# Patient Record
Sex: Female | Born: 1975
Health system: Southern US, Community
[De-identification: ages and names within clinical notes are randomized; demographics above are authoritative.]

## PROBLEM LIST (undated history)

## (undated) DIAGNOSIS — R7303 Prediabetes: Secondary | ICD-10-CM

## (undated) DIAGNOSIS — IMO0001 Reserved for inherently not codable concepts without codable children: Secondary | ICD-10-CM

## (undated) DIAGNOSIS — Z872 Personal history of diseases of the skin and subcutaneous tissue: Secondary | ICD-10-CM

## (undated) DIAGNOSIS — I10 Essential (primary) hypertension: Secondary | ICD-10-CM

## (undated) HISTORY — DX: Essential (primary) hypertension: I10

## (undated) HISTORY — PX: PILONIDAL CYST EXCISION: SHX744

## (undated) HISTORY — DX: Personal history of diseases of the skin and subcutaneous tissue: Z87.2

## (undated) HISTORY — DX: Prediabetes: R73.03

## (undated) HISTORY — DX: Reserved for inherently not codable concepts without codable children: IMO0001

## (undated) HISTORY — PX: WISDOM TOOTH EXTRACTION: SHX21

---

## 2007-03-14 DIAGNOSIS — IMO0001 Reserved for inherently not codable concepts without codable children: Secondary | ICD-10-CM

## 2007-03-14 HISTORY — DX: Reserved for inherently not codable concepts without codable children: IMO0001

## 2009-08-13 ENCOUNTER — Ambulatory Visit: Payer: Self-pay | Admitting: Anesthesiology

## 2011-03-23 ENCOUNTER — Ambulatory Visit: Payer: Self-pay | Admitting: Internal Medicine

## 2011-04-13 ENCOUNTER — Ambulatory Visit: Payer: Self-pay | Admitting: Internal Medicine

## 2011-04-17 ENCOUNTER — Encounter: Payer: Self-pay | Admitting: Internal Medicine

## 2011-04-17 ENCOUNTER — Ambulatory Visit (INDEPENDENT_AMBULATORY_CARE_PROVIDER_SITE_OTHER): Payer: BC Managed Care – PPO | Admitting: Internal Medicine

## 2011-04-17 DIAGNOSIS — E119 Type 2 diabetes mellitus without complications: Secondary | ICD-10-CM | POA: Insufficient documentation

## 2011-04-17 DIAGNOSIS — I152 Hypertension secondary to endocrine disorders: Secondary | ICD-10-CM | POA: Insufficient documentation

## 2011-04-17 DIAGNOSIS — L02219 Cutaneous abscess of trunk, unspecified: Secondary | ICD-10-CM

## 2011-04-17 DIAGNOSIS — I10 Essential (primary) hypertension: Secondary | ICD-10-CM

## 2011-04-17 DIAGNOSIS — E1169 Type 2 diabetes mellitus with other specified complication: Secondary | ICD-10-CM | POA: Insufficient documentation

## 2011-04-17 DIAGNOSIS — L03319 Cellulitis of trunk, unspecified: Secondary | ICD-10-CM

## 2011-04-17 DIAGNOSIS — E669 Obesity, unspecified: Secondary | ICD-10-CM | POA: Insufficient documentation

## 2011-04-17 DIAGNOSIS — E1165 Type 2 diabetes mellitus with hyperglycemia: Secondary | ICD-10-CM | POA: Insufficient documentation

## 2011-04-17 LAB — LIPID PANEL
Cholesterol: 126 mg/dL (ref 0–200)
HDL: 50.3 mg/dL (ref >39.00)
LDL Cholesterol: 66 mg/dL (ref 0–99)
Triglycerides: 47 mg/dL (ref 0.0–149.0)
VLDL: 9.4 mg/dL (ref 0.0–40.0)

## 2011-04-17 LAB — COMPREHENSIVE METABOLIC PANEL
Alkaline Phosphatase: 83 U/L (ref 39–117)
BUN: 17 mg/dL (ref 6–23)
CO2: 26 mEq/L (ref 19–32)
Creatinine, Ser: 0.7 mg/dL (ref 0.4–1.2)
GFR: 114.92 mL/min (ref >60.00)
Glucose, Bld: 126 mg/dL — ABNORMAL HIGH (ref 70–99)
Sodium: 138 mEq/L (ref 135–145)
Total Bilirubin: 0.1 mg/dL — ABNORMAL LOW (ref 0.3–1.2)

## 2011-04-17 MED ORDER — PHENTERMINE HCL 37.5 MG PO CAPS: 37.5000 mg | ORAL_CAPSULE | ORAL | Status: DC

## 2011-04-17 MED ORDER — SULFAMETHOXAZOLE-TRIMETHOPRIM 800-160 MG PO TABS: 1.0000 | ORAL_TABLET | Freq: Two times a day (BID) | ORAL | Status: AC

## 2011-04-17 MED ORDER — METFORMIN HCL ER 500 MG PO TB24: 1000.0000 mg | ORAL_TABLET | Freq: Two times a day (BID) | ORAL | Status: DC

## 2011-04-17 NOTE — Assessment & Plan Note (Signed)
Patient with cellulitis on her left upper back. No fluctuant area visualized. Will treat with Bactrim. Patient will call if she develops any worsening swelling, pain, fever, or chills. Otherwise, followup in one week.

## 2011-04-17 NOTE — Assessment & Plan Note (Signed)
Blood pressure well-controlled today. We'll continue lisinopril hydrochlorothiazide. We'll check renal function with labs today. Followup in one week.

## 2011-04-17 NOTE — Assessment & Plan Note (Signed)
Patient is concerned about weight. BMI is 34. We discussed keeping a food diary and setting goal of calories less than 1500 per day. We also discussed setting a goal of moderate exercise such as walking 30 minutes 3 days a week. We will also start phentermine to help with appetite suppression. We discussed risk of phentermine including elevated blood pressure and increased heart rate. Patient will followup in one to 2 weeks for evaluation. Goal will be for one to 2 pounds of weight loss per week.

## 2011-04-17 NOTE — Progress Notes (Signed)
Subjective:    Patient ID: Veronica Norton, female    DOB: 1975-07-01, 36 y.o.   MRN: [id number]  HPI 36 year old female with history of diabetes, hypertension, obesity presents to establish care. In regards to her diabetes, she notes that she has not been monitoring her blood sugar. She has been taking her metformin. She has not had her A1c checked in several months. She denies any polyuria or polyphasia. She denies weight loss.  In regards to her hypertension, she denies any headache, chest pain, palpitations. She reports compliance with her lisinopril.  In regards to her obesity, she notes that she was evaluated for gastric bypass in the past but denied. She notes some recent dietary indiscretion. She is highly motivated and wants to get started a weight loss program. She does not currently follow any specific diet. She is currently sedentary.  She is also concerned about a several day history of red raised area on her left upper back. She denies any fever or chills. She denies any known injury at the site.  Outpatient Encounter Prescriptions as of 04/17/2011  Medication Sig Dispense Refill  . lisinopril-hydrochlorothiazide (PRINZIDE,ZESTORETIC) 10-12.5 MG per tablet Take 1 tablet by mouth daily.      . metFORMIN (GLUCOPHAGE-XR) 500 MG 24 hr tablet Take 1,000 mg by mouth 2 (two) times daily.      . norethindrone (NOR-QD) 0.35 MG tablet Take 1 tablet by mouth daily.      . phentermine 37.5 MG capsule Take 1 capsule (37.5 mg total) by mouth every morning.  30 capsule  2  . sulfamethoxazole-trimethoprim (BACTRIM DS,SEPTRA DS) 800-160 MG per tablet Take 1 tablet by mouth 2 (two) times daily.  20 tablet  0    Review of Systems  Constitutional: Negative for fever, chills, appetite change, fatigue and unexpected weight change.  HENT: Negative for ear pain, congestion, sore throat, trouble swallowing, neck pain, voice change and sinus pressure.   Eyes: Negative for visual disturbance.  Respiratory:  Negative for cough, shortness of breath, wheezing and stridor.   Cardiovascular: Negative for chest pain, palpitations and leg swelling.  Gastrointestinal: Negative for nausea, vomiting, abdominal pain, diarrhea, constipation, blood in stool, abdominal distention and anal bleeding.  Genitourinary: Negative for dysuria and flank pain.  Musculoskeletal: Negative for myalgias, arthralgias and gait problem.  Skin: Positive for color change and rash.  Neurological: Negative for dizziness and headaches.  Hematological: Negative for adenopathy. Does not bruise/bleed easily.  Psychiatric/Behavioral: Negative for suicidal ideas, sleep disturbance and dysphoric mood. The patient is not nervous/anxious.    BP 112/60  Pulse 97  Temp(Src) 98 F (36.7 C) (Oral)  Ht 5\' 5"  (1.651 m)  Wt 208 lb (94.348 kg)  BMI 34.61 kg/m2  SpO2 100%  LMP 03/17/2011     Objective:   Physical Exam  Constitutional: She is oriented to person, place, and time. She appears well-developed and well-nourished. No distress.  HENT:  Head: Normocephalic and atraumatic.  Right Ear: External ear normal.  Left Ear: External ear normal.  Nose: Nose normal.  Mouth/Throat: Oropharynx is clear and moist. No oropharyngeal exudate.  Eyes: Conjunctivae are normal. Pupils are equal, round, and reactive to light. Right eye exhibits no discharge. Left eye exhibits no discharge. No scleral icterus.  Neck: Normal range of motion. Neck supple. No tracheal deviation present. No thyromegaly present.  Cardiovascular: Normal rate, regular rhythm, normal heart sounds and intact distal pulses.  Exam reveals no gallop and no friction rub.   No murmur heard. Pulmonary/Chest:  Effort normal and breath sounds normal. No respiratory distress. She has no wheezes. She has no rales. She exhibits no tenderness.  Abdominal: Soft. Bowel sounds are normal. She exhibits no distension and no mass. There is no tenderness. There is no rebound and no guarding.    Musculoskeletal: Normal range of motion. She exhibits no edema and no tenderness.  Lymphadenopathy:    She has no cervical adenopathy.  Neurological: She is alert and oriented to person, place, and time. No cranial nerve deficit. She exhibits normal muscle tone. Coordination normal.  Skin: Skin is warm and dry. No rash noted. She is not diaphoretic. No erythema. No pallor.  Psychiatric: She has a normal mood and affect. Her behavior is normal. Judgment and thought content normal.          Assessment & Plan:

## 2011-04-17 NOTE — Patient Instructions (Signed)
Goal - write down everything you eat.  Goal - less than 1500 calories per day.  Goal - walk , 3 x per week.

## 2011-04-17 NOTE — Assessment & Plan Note (Signed)
Patient has not been regularly checking blood sugars. We'll plan to check hemoglobin A1c with labs today. We'll continue metformin. Followup in 2 weeks.

## 2011-04-24 ENCOUNTER — Ambulatory Visit (INDEPENDENT_AMBULATORY_CARE_PROVIDER_SITE_OTHER): Payer: BC Managed Care – PPO | Admitting: Internal Medicine

## 2011-04-24 ENCOUNTER — Encounter: Payer: Self-pay | Admitting: Internal Medicine

## 2011-04-24 VITALS — BP 118/62 | HR 91 | Temp 97.7°F | Ht 68.0 in | Wt 278.0 lb

## 2011-04-24 DIAGNOSIS — E1169 Type 2 diabetes mellitus with other specified complication: Secondary | ICD-10-CM

## 2011-04-24 DIAGNOSIS — N912 Amenorrhea, unspecified: Secondary | ICD-10-CM

## 2011-04-24 DIAGNOSIS — E119 Type 2 diabetes mellitus without complications: Secondary | ICD-10-CM

## 2011-04-24 DIAGNOSIS — I10 Essential (primary) hypertension: Secondary | ICD-10-CM

## 2011-04-24 DIAGNOSIS — L02219 Cutaneous abscess of trunk, unspecified: Secondary | ICD-10-CM

## 2011-04-24 DIAGNOSIS — L03319 Cellulitis of trunk, unspecified: Secondary | ICD-10-CM

## 2011-04-24 DIAGNOSIS — E669 Obesity, unspecified: Secondary | ICD-10-CM

## 2011-04-24 NOTE — Assessment & Plan Note (Signed)
Patient started phentermine. No side effects noted. Patient has lost 2 pounds since her last visit 2 weeks ago. Encouraged her to write down what she is eating. Will continue phentermine. Goal weight loss 1-2 pounds per week. Followup in one month.

## 2011-04-24 NOTE — Assessment & Plan Note (Signed)
Hemoglobin A1c 6.6% on recent labs. We'll continue to monitor blood sugars. We'll continue metformin. Encouraged efforts at weight loss. Followup in one month.

## 2011-04-24 NOTE — Assessment & Plan Note (Signed)
Area has completely resolved with the use of antibiotics. Patient will continue to monitor for any recurrence.

## 2011-04-24 NOTE — Assessment & Plan Note (Signed)
Likely secondary to concomitant use of birth control pill and antibiotics. Urine pregnancy test today was negative. Patient will call if no menstrual cycle by next week.

## 2011-04-24 NOTE — Assessment & Plan Note (Signed)
Blood pressures well-controlled today despite the use of phentermine. Recent renal function is normal. Patient will followup in one month.

## 2011-04-24 NOTE — Progress Notes (Signed)
Subjective:    Patient ID: Veronica Norton, female    DOB: [birth year], 36 y.o.   MRN: [id number]  HPI 36 year old female with history of diabetes, hypertension, and obesity presents for followup. In regards to her diabetes, recent hemoglobin A1c was 6.6%. She notes full compliance with her metformin. She did not bring a record of her blood sugars today.  In regards to her hypertension, patient notes full compliance with her medication. She denies any chest pain, palpitations, or headache.  In regards to her obesity, she recently started using phentermine to help with appetite suppression. She has not been recording her food diary. She does, however note a decrease in appetite over the last 2 weeks. She has lost 2 pounds. She denies any side effects noted from medication such as palpitation, chest pain, headache. She has not yet started an exercise program.  She is concerned today about amenorrhea. She notes she was scheduled for her. Last week but has not yet started. She has been using antibiotics to help with skin infection on her back in questions whether this may have interfered with her birth control pill.  In regards to the skin infection on her back, she reports near resolution of swelling and redness. She denies any fever or chills. She denies any noted side effects from antibiotics. She reports full compliance with her antibiotics.  Outpatient Encounter Prescriptions as of 04/24/2011  Medication Sig Dispense Refill  . lisinopril-hydrochlorothiazide (PRINZIDE,ZESTORETIC) 10-12.5 MG per tablet Take 1 tablet by mouth daily.      . metFORMIN (GLUCOPHAGE-XR) 500 MG 24 hr tablet Take 2 tablets (1,000 mg total) by mouth 2 (two) times daily.  360 tablet  1  . norethindrone (NOR-QD) 0.35 MG tablet Take 1 tablet by mouth daily.      . phentermine 37.5 MG capsule Take 1 capsule (37.5 mg total) by mouth every morning.  30 capsule  2  . sulfamethoxazole-trimethoprim (BACTRIM DS,SEPTRA DS) 800-160 MG per  tablet Take 1 tablet by mouth 2 (two) times daily.  20 tablet  0    Review of Systems  Constitutional: Negative for fever, chills, appetite change, fatigue and unexpected weight change.  HENT: Negative for ear pain, congestion, sore throat, trouble swallowing, neck pain, voice change and sinus pressure.   Eyes: Negative for visual disturbance.  Respiratory: Negative for cough, shortness of breath and wheezing.   Cardiovascular: Negative for chest pain, palpitations and leg swelling.  Gastrointestinal: Negative for nausea, vomiting, abdominal pain, diarrhea, constipation, blood in stool, abdominal distention and anal bleeding.  Genitourinary: Positive for menstrual problem. Negative for dysuria and flank pain.  Musculoskeletal: Negative for myalgias, arthralgias and gait problem.  Skin: Negative for color change and rash.  Neurological: Negative for dizziness and headaches.  Hematological: Negative for adenopathy. Does not bruise/bleed easily.  Psychiatric/Behavioral: Negative for suicidal ideas, sleep disturbance and dysphoric mood. The patient is not nervous/anxious.    BP 118/62  Pulse 91  Temp(Src) 97.7 F (36.5 C) (Oral)  Ht 5\' 8"  (1.727 m)  Wt 278 lb (126.1 kg)  BMI 42.27 kg/m2  SpO2 98%  LMP 03/17/2011     Objective:   Physical Exam  Constitutional: She is oriented to person, place, and time. She appears well-developed and well-nourished. No distress.  HENT:  Head: Normocephalic and atraumatic.  Right Ear: External ear normal.  Left Ear: External ear normal.  Nose: Nose normal.  Mouth/Throat: Oropharynx is clear and moist. No oropharyngeal exudate.  Eyes: Conjunctivae are normal. Pupils are equal, round,  and reactive to light. Right eye exhibits no discharge. Left eye exhibits no discharge. No scleral icterus.  Neck: Normal range of motion. Neck supple. No tracheal deviation present. No thyromegaly present.  Cardiovascular: Normal rate, regular rhythm, normal heart sounds  and intact distal pulses.  Exam reveals no gallop and no friction rub.   No murmur heard. Pulmonary/Chest: Effort normal and breath sounds normal. No respiratory distress. She has no wheezes. She has no rales. She exhibits no tenderness.  Musculoskeletal: Normal range of motion. She exhibits no edema and no tenderness.  Lymphadenopathy:    She has no cervical adenopathy.  Neurological: She is alert and oriented to person, place, and time. No cranial nerve deficit. She exhibits normal muscle tone. Coordination normal.  Skin: Skin is warm and dry. No rash noted. She is not diaphoretic. No erythema. No pallor.  Psychiatric: She has a normal mood and affect. Her behavior is normal. Judgment and thought content normal.          Assessment & Plan:

## 2011-05-23 ENCOUNTER — Ambulatory Visit (INDEPENDENT_AMBULATORY_CARE_PROVIDER_SITE_OTHER): Payer: BC Managed Care – PPO | Admitting: Internal Medicine

## 2011-05-23 ENCOUNTER — Encounter: Payer: Self-pay | Admitting: Internal Medicine

## 2011-05-23 DIAGNOSIS — E119 Type 2 diabetes mellitus without complications: Secondary | ICD-10-CM

## 2011-05-23 DIAGNOSIS — E1169 Type 2 diabetes mellitus with other specified complication: Secondary | ICD-10-CM

## 2011-05-23 DIAGNOSIS — I10 Essential (primary) hypertension: Secondary | ICD-10-CM

## 2011-05-23 DIAGNOSIS — E669 Obesity, unspecified: Secondary | ICD-10-CM

## 2011-05-23 NOTE — Assessment & Plan Note (Signed)
Blood pressure well-controlled today. We'll continue lisinopril hydrochlorothiazide. Followup 3 months.

## 2011-05-23 NOTE — Assessment & Plan Note (Signed)
Pt stopped phentermine because having some side effects including coolness in hands. She has been able to eliminate fried foods from her diet. Encouraged her to try limiting intake of sweets. Encouraged her to increase physical activity. She will followup in 3 months.

## 2011-05-23 NOTE — Assessment & Plan Note (Signed)
Recent A1c was well controlled at 6.6%. We'll continue metformin. Encouraged improved dietary habits and weight loss. Followup 3 months.

## 2011-05-23 NOTE — Progress Notes (Signed)
Subjective:    Patient ID: Veronica Norton, female    DOB: 19-Sep-1975, 36 y.o.   MRN: [id number]  HPI 36 year old female with history of obesity, diabetes, and hypertension presents for followup. She reports that she has been doing well. She notes that she stopped taking phentermine because she was having a side effect of coldness in her hands. This resolved with stopping the medication. She notes that she is working on her diet and has eliminated intake of fried foods. She is trying to reduce intake of processed sugar, but finds this difficult. In regards to her diabetes, she has not been checking her blood sugars. In regards to her hypertension, she denies any recent chest pain, palpitations, headache. She reports full compliance with her medicines.  Outpatient Encounter Prescriptions as of 05/23/2011  Medication Sig Dispense Refill  . lisinopril-hydrochlorothiazide (PRINZIDE,ZESTORETIC) 10-12.5 MG per tablet Take 1 tablet by mouth daily.      . metFORMIN (GLUCOPHAGE-XR) 500 MG 24 hr tablet Take 2 tablets (1,000 mg total) by mouth 2 (two) times daily.  360 tablet  1  . norethindrone (NOR-QD) 0.35 MG tablet Take 1 tablet by mouth daily.        Review of Systems  Constitutional: Negative for fever, chills, appetite change, fatigue and unexpected weight change.  HENT: Negative for ear pain, congestion, sore throat, trouble swallowing, neck pain, voice change and sinus pressure.   Eyes: Negative for visual disturbance.  Respiratory: Negative for cough, shortness of breath, wheezing and stridor.   Cardiovascular: Negative for chest pain, palpitations and leg swelling.  Gastrointestinal: Negative for nausea, vomiting, abdominal pain, diarrhea, constipation, blood in stool, abdominal distention and anal bleeding.  Genitourinary: Negative for dysuria and flank pain.  Musculoskeletal: Negative for myalgias, arthralgias and gait problem.  Skin: Negative for color change and rash.  Neurological: Negative  for dizziness and headaches.  Hematological: Negative for adenopathy. Does not bruise/bleed easily.  Psychiatric/Behavioral: Negative for suicidal ideas, sleep disturbance and dysphoric mood. The patient is not nervous/anxious.    BP 120/82  Pulse 118  Temp(Src) 98.6 F (37 C) (Oral)  Wt 279 lb 12 oz (126.894 kg)  SpO2 99%  LMP 05/23/2011     Objective:   Physical Exam  Constitutional: She is oriented to person, place, and time. She appears well-developed and well-nourished. No distress.  HENT:  Head: Normocephalic and atraumatic.  Right Ear: External ear normal.  Left Ear: External ear normal.  Nose: Nose normal.  Mouth/Throat: Oropharynx is clear and moist. No oropharyngeal exudate.  Eyes: Conjunctivae are normal. Pupils are equal, round, and reactive to light. Right eye exhibits no discharge. Left eye exhibits no discharge. No scleral icterus.  Neck: Normal range of motion. Neck supple. No tracheal deviation present. No thyromegaly present.  Cardiovascular: Normal rate, regular rhythm, normal heart sounds and intact distal pulses.  Exam reveals no gallop and no friction rub.   No murmur heard. Pulmonary/Chest: Effort normal and breath sounds normal. No respiratory distress. She has no wheezes. She has no rales. She exhibits no tenderness.  Musculoskeletal: Normal range of motion. She exhibits no edema and no tenderness.  Lymphadenopathy:    She has no cervical adenopathy.  Neurological: She is alert and oriented to person, place, and time. No cranial nerve deficit. She exhibits normal muscle tone. Coordination normal.  Skin: Skin is warm and dry. No rash noted. She is not diaphoretic. No erythema. No pallor.  Psychiatric: She has a normal mood and affect. Her behavior is normal. Judgment  and thought content normal.          Assessment & Plan:

## 2011-05-24 ENCOUNTER — Encounter: Payer: Self-pay | Admitting: Internal Medicine

## 2011-06-02 ENCOUNTER — Telehealth: Payer: Self-pay | Admitting: Internal Medicine

## 2011-06-02 ENCOUNTER — Encounter: Payer: Self-pay | Admitting: *Deleted

## 2011-06-02 ENCOUNTER — Encounter: Payer: Self-pay | Admitting: Internal Medicine

## 2011-06-02 NOTE — Telephone Encounter (Signed)
MyChart mess sent

## 2011-06-02 NOTE — Telephone Encounter (Signed)
[facility] Triage Call Report Triage Record Num: [id number] Operator: Chevis Pretty Patient Name: Veronica Norton Call Date & Time: 06/02/2011 8:09:27AM Patient Phone: [phone] PCP: Ronna Polio Patient Gender: Female PCP Fax : [phone] Patient DOB: Jul 23, 1975 Practice Name: [facility] Reason for Call: Caller: [place]/Patient; PCP: Ronna Polio; CB#: [phone]; ; ; Call regarding Congestion, Cough, ?. Sinus Infx; onset 05/30/11. Both ears ache as well. Has had headache > 24 hours. Per protocol, emergent symptoms denied; advised appt within 24 hours. No appts available in office schedule; TC to office staff. Per staff, to send to UC. Patient advised; states prefers UC over calling after 1200 for appt at [facility] in AM. Protocol(s) Used: Upper Respiratory Infection (URI) Recommended Outcome per Protocol: See Provider within 24 hours Reason for Outcome: Mild to moderate headache for more than 24 hours unrelieved with nonprescription medications Care Advice: ~ Use a cool mist humidifier to moisten air. Be sure to clean according to manufacturer's instructions. Call provider if headache worsens, develops temperature greater than 101.40F (38.1C) or any temperature elevation in a geriatric or immunocompromised patient (such as diabetes, HIV/AIDS, chemotherapy, organ transplant, or chronic steroid use). ~ ~ SYMPTOM / CONDITION MANAGEMENT ~ CAUTIONS A warm, moist compress placed on face, over eyes for 15 to 20 minutes, 5 to 6 times a day, may help relieve the congestion. ~ Most adults need to drink 6-10 eight-ounce glasses (1.2-2.0 liters) of fluids per day unless previously told to limit fluid intake for other medical reasons. Limit fluids that contain caffeine, sugar or alcohol. Urine will be a very light yellow color when you drink enough fluids. ~ Analgesic/Antipyretic Advice - Acetaminophen: Consider acetaminophen as directed on label or by  pharmacist/provider for pain or fever. PRECAUTIONS: - Use only if there is no history of liver disease, alcoholism, or intake of three or more alcohol drinks per day. - If approved by provider when breastfeeding. - Do not exceed recommended dose or frequency. ~ Analgesic/Antipyretic Advice - NSAIDs: Consider aspirin, ibuprofen, naproxen or ketoprofen for pain or fever as directed on label or by pharmacist/provider. PRECAUTIONS: - If over 24 years of age, should not take longer than 1 week without consulting provider. EXCEPTIONS: - Should not be used if taking blood thinners or have bleeding problems. - Do not use if have history of sensitivity/allergy to any of these medications; or history of cardiovascular, ulcer, kidney, liver disease or diabetes unless approved by provider. - Do not exceed recommended dose or frequency. ~ 06/02/2011 8:23:35AM Page 1 of 1 [id number]

## 2011-06-02 NOTE — Telephone Encounter (Signed)
Caller: Veronica Norton/Patient; PCP: Ronna Polio; CB#: [phone]; ; ; Call regarding Congestion, Cough, ?. Sinus Infx; onset 05/30/11.  Both ears ache as well.  Has had headache > 24 hours.  Per protocol, emergent symptoms denied; advised appt within 24 hours.  No appts available in office schedule; TC to office staff.  Per staff, to send to UC.  Patient advised; states prefers UC over calling after 1200 for appt at [facility] in AM.

## 2011-08-24 ENCOUNTER — Ambulatory Visit: Payer: BC Managed Care – PPO | Admitting: Internal Medicine

## 2011-09-05 ENCOUNTER — Encounter: Payer: Self-pay | Admitting: Internal Medicine

## 2011-09-05 ENCOUNTER — Other Ambulatory Visit: Payer: Self-pay | Admitting: *Deleted

## 2011-09-05 MED ORDER — LISINOPRIL-HYDROCHLOROTHIAZIDE 10-12.5 MG PO TABS: 1.0000 | ORAL_TABLET | Freq: Every day | ORAL | Status: DC

## 2011-09-07 ENCOUNTER — Ambulatory Visit: Payer: BC Managed Care – PPO | Admitting: Internal Medicine

## 2011-09-21 ENCOUNTER — Ambulatory Visit: Payer: BC Managed Care – PPO | Admitting: Internal Medicine

## 2011-10-13 ENCOUNTER — Encounter: Payer: Self-pay | Admitting: Internal Medicine

## 2011-10-13 ENCOUNTER — Ambulatory Visit (INDEPENDENT_AMBULATORY_CARE_PROVIDER_SITE_OTHER): Payer: No Typology Code available for payment source | Admitting: Internal Medicine

## 2011-10-13 VITALS — BP 110/72 | HR 93 | Temp 98.7°F | Ht 68.0 in | Wt 278.2 lb

## 2011-10-13 DIAGNOSIS — E669 Obesity, unspecified: Secondary | ICD-10-CM

## 2011-10-13 DIAGNOSIS — E119 Type 2 diabetes mellitus without complications: Secondary | ICD-10-CM

## 2011-10-13 DIAGNOSIS — E1169 Type 2 diabetes mellitus with other specified complication: Secondary | ICD-10-CM

## 2011-10-13 DIAGNOSIS — I10 Essential (primary) hypertension: Secondary | ICD-10-CM

## 2011-10-13 LAB — HEMOGLOBIN A1C: Mean Plasma Glucose: 134 mg/dL — ABNORMAL HIGH (ref <117)

## 2011-10-13 NOTE — Patient Instructions (Signed)
[facility] 

## 2011-10-14 LAB — COMPREHENSIVE METABOLIC PANEL
ALT: 19 U/L (ref 0–35)
Albumin: 3.6 g/dL (ref 3.5–5.2)
CO2: 25 mEq/L (ref 19–32)
Calcium: 8.9 mg/dL (ref 8.4–10.5)
Chloride: 101 mEq/L (ref 96–112)
Creat: 0.68 mg/dL (ref 0.50–1.10)
Potassium: 4.3 mEq/L (ref 3.5–5.3)

## 2011-10-14 NOTE — Assessment & Plan Note (Signed)
Blood pressure currently well-controlled. Will continue lisinopril hydrochlorothiazide. Followup 3 months.

## 2011-10-14 NOTE — Progress Notes (Signed)
Subjective:    Patient ID: Veronica Norton, female    DOB: [birth year], 36 y.o.   MRN: [id number]  HPI 36 year old female with history of obesity, diabetes, and hypertension presents for followup. She reports she is generally feeling well. She has been compliant with medications but has not been recording her blood sugars. She has not been compliant with diet. She reports intake of sweet beverages such as sweet tea. She occasionally reports making better dietary choices such as eating lean salads for lunch. She has not been following a specific exercise program.  Outpatient Encounter Prescriptions as of 10/13/2011  Medication Sig Dispense Refill  . lisinopril-hydrochlorothiazide (PRINZIDE,ZESTORETIC) 10-12.5 MG per tablet Take 1 tablet by mouth daily.  30 tablet  6  . metFORMIN (GLUCOPHAGE-XR) 500 MG 24 hr tablet Take 2 tablets (1,000 mg total) by mouth 2 (two) times daily.  360 tablet  1  . ORTHO MICRONOR 0.35 MG tablet       . phentermine 37.5 MG capsule Take 1 capsule (37.5 mg total) by mouth every morning.  30 capsule  2  . DISCONTD: norethindrone (NOR-QD) 0.35 MG tablet Take 1 tablet by mouth daily.        Review of Systems  Constitutional: Negative for fever, chills, appetite change, fatigue and unexpected weight change.  HENT: Negative for ear pain, congestion, sore throat, trouble swallowing, neck pain, voice change and sinus pressure.   Eyes: Negative for visual disturbance.  Respiratory: Negative for cough, shortness of breath, wheezing and stridor.   Cardiovascular: Negative for chest pain, palpitations and leg swelling.  Gastrointestinal: Negative for nausea, vomiting, abdominal pain, diarrhea, constipation, blood in stool, abdominal distention and anal bleeding.  Genitourinary: Negative for dysuria and flank pain.  Musculoskeletal: Negative for myalgias, arthralgias and gait problem.  Skin: Negative for color change and rash.  Neurological: Negative for dizziness and headaches.    Hematological: Negative for adenopathy. Does not bruise/bleed easily.  Psychiatric/Behavioral: Negative for suicidal ideas, disturbed wake/sleep cycle and dysphoric mood. The patient is not nervous/anxious.    BP 110/72  Pulse 93  Temp 98.7 F (37.1 C) (Oral)  Ht 5\' 8"  (1.727 m)  Wt 278 lb 4 oz (126.213 kg)  BMI 42.31 kg/m2  SpO2 98%  LMP 10/06/2011     Objective:   Physical Exam  Constitutional: She is oriented to person, place, and time. She appears well-developed and well-nourished. No distress.  HENT:  Head: Normocephalic and atraumatic.  Right Ear: External ear normal.  Left Ear: External ear normal.  Nose: Nose normal.  Mouth/Throat: Oropharynx is clear and moist. No oropharyngeal exudate.  Eyes: Conjunctivae are normal. Pupils are equal, round, and reactive to light. Right eye exhibits no discharge. Left eye exhibits no discharge. No scleral icterus.  Neck: Normal range of motion. Neck supple. No tracheal deviation present. No thyromegaly present.  Cardiovascular: Normal rate, regular rhythm, normal heart sounds and intact distal pulses.  Exam reveals no gallop and no friction rub.   No murmur heard. Pulmonary/Chest: Effort normal and breath sounds normal. No respiratory distress. She has no wheezes. She has no rales. She exhibits no tenderness.  Musculoskeletal: Normal range of motion. She exhibits no edema and no tenderness.  Lymphadenopathy:    She has no cervical adenopathy.  Neurological: She is alert and oriented to person, place, and time. No cranial nerve deficit. She exhibits normal muscle tone. Coordination normal.  Skin: Skin is warm and dry. No rash noted. She is not diaphoretic. No erythema. No pallor.  Psychiatric: She has a normal mood and affect. Her behavior is normal. Judgment and thought content normal.          Assessment & Plan:

## 2011-10-14 NOTE — Assessment & Plan Note (Signed)
Patient has not been checking blood sugars at home. She reports compliance with metformin. Encouraged her to check blood sugars on a more regular basis. Hemoglobin A1c today was well controlled. Will set up nutrition counseling. Followup 3 months.

## 2011-10-14 NOTE — Assessment & Plan Note (Signed)
Weight is unchanged today. Patient making poor dietary choices with increased intake of sweet beverages such as sweet tea. Encouraged her to avoid intake of sweet beverages. Will set up nutrition counseling. Followup in 3 months.

## 2011-10-19 ENCOUNTER — Encounter: Payer: Self-pay | Admitting: Internal Medicine

## 2011-10-19 ENCOUNTER — Telehealth: Payer: Self-pay | Admitting: Internal Medicine

## 2011-10-19 DIAGNOSIS — E119 Type 2 diabetes mellitus without complications: Secondary | ICD-10-CM

## 2011-10-19 NOTE — Telephone Encounter (Signed)
Message copied by [employer] on Thu Oct 19, 2011 12:25 PM ------      Message from: Gilmer Mor      Created: Thu Oct 19, 2011 10:09 AM       Please place referral for nutritionist.             Thanks,      Lowella Bandy

## 2011-11-21 ENCOUNTER — Ambulatory Visit: Payer: Self-pay | Admitting: Internal Medicine

## 2011-12-12 ENCOUNTER — Ambulatory Visit: Payer: Self-pay | Admitting: Internal Medicine

## 2012-01-11 ENCOUNTER — Encounter: Payer: Self-pay | Admitting: Internal Medicine

## 2012-01-12 ENCOUNTER — Ambulatory Visit: Payer: Self-pay | Admitting: Internal Medicine

## 2012-01-18 ENCOUNTER — Ambulatory Visit (INDEPENDENT_AMBULATORY_CARE_PROVIDER_SITE_OTHER): Payer: No Typology Code available for payment source | Admitting: Internal Medicine

## 2012-01-18 ENCOUNTER — Encounter: Payer: Self-pay | Admitting: Internal Medicine

## 2012-01-18 VITALS — BP 130/80 | HR 78 | Temp 98.7°F | Ht 68.0 in | Wt 274.5 lb

## 2012-01-18 DIAGNOSIS — E1169 Type 2 diabetes mellitus with other specified complication: Secondary | ICD-10-CM

## 2012-01-18 DIAGNOSIS — Z23 Encounter for immunization: Secondary | ICD-10-CM

## 2012-01-18 DIAGNOSIS — E119 Type 2 diabetes mellitus without complications: Secondary | ICD-10-CM

## 2012-01-18 DIAGNOSIS — E669 Obesity, unspecified: Secondary | ICD-10-CM

## 2012-01-18 DIAGNOSIS — I10 Essential (primary) hypertension: Secondary | ICD-10-CM

## 2012-01-18 DIAGNOSIS — Z0000 Encounter for general adult medical examination without abnormal findings: Secondary | ICD-10-CM | POA: Insufficient documentation

## 2012-01-18 NOTE — Assessment & Plan Note (Signed)
Patient has not been checking blood sugars. Will check A1c with labs today. Follow up 3 months.

## 2012-01-18 NOTE — Assessment & Plan Note (Signed)
Blood pressure well-controlled on lisinopril hydrochlorothiazide. We'll check renal function with labs today. Followup 3 months.

## 2012-01-18 NOTE — Assessment & Plan Note (Signed)
General medical exam is normal today. Pap smear is up-to-date and was performed December 2012 and was normal, HPV negative. Patient declined breast exam today. Patient is up-to-date on immunizations. Encourage continued efforts at healthy diet and increase physical activity. Followup in 3 months or sooner as needed.

## 2012-01-18 NOTE — Progress Notes (Signed)
Subjective:    Patient ID: Veronica Norton, female    DOB: 1975-05-26, 36 y.o.   MRN: [id number]  HPI 36 year old female with history of hypertension, diabetes, obesity presents for annual exam. She reports she is generally feeling well. She denies any concerns today. In regards to diabetes, she has not been checking her blood sugar but reports full compliance with her metformin. In regards to hypertension, she reports full compliance with lisinopril. She denies any headache, palpitations, chest pain. In regards to her obesity, she has been trying to adopt a healthier diet and has been exercising by participating in aerobics class at least once per week. She reports that she is up-to-date on health maintenance including Pap smear which was performed in December 2012 was normal.  Outpatient Encounter Prescriptions as of 01/18/2012  Medication Sig Dispense Refill  . lisinopril-hydrochlorothiazide (PRINZIDE,ZESTORETIC) 10-12.5 MG per tablet Take 1 tablet by mouth daily.  30 tablet  6  . metFORMIN (GLUCOPHAGE-XR) 500 MG 24 hr tablet Take 2 tablets (1,000 mg total) by mouth 2 (two) times daily.  360 tablet  1  . ORTHO MICRONOR 0.35 MG tablet Take 1 tablet by mouth daily.       . phentermine 37.5 MG capsule Take 1 capsule (37.5 mg total) by mouth every morning.  30 capsule  2   BP 130/80  Pulse 78  Temp 98.7 F (37.1 C) (Oral)  Ht 5\' 8"  (1.727 m)  Wt 274 lb 8 oz (124.512 kg)  BMI 41.74 kg/m2  SpO2 97%  LMP 01/12/2012  Review of Systems  Constitutional: Negative for fever, chills, appetite change, fatigue and unexpected weight change.  HENT: Negative for ear pain, congestion, sore throat, trouble swallowing, neck pain, voice change and sinus pressure.   Eyes: Negative for visual disturbance.  Respiratory: Negative for cough, shortness of breath, wheezing and stridor.   Cardiovascular: Negative for chest pain, palpitations and leg swelling.  Gastrointestinal: Negative for nausea, vomiting, abdominal  pain, diarrhea, constipation, blood in stool, abdominal distention and anal bleeding.  Genitourinary: Negative for dysuria and flank pain.  Musculoskeletal: Negative for myalgias, arthralgias and gait problem.  Skin: Negative for color change and rash.  Neurological: Negative for dizziness and headaches.  Hematological: Negative for adenopathy. Does not bruise/bleed easily.  Psychiatric/Behavioral: Negative for suicidal ideas, sleep disturbance and dysphoric mood. The patient is not nervous/anxious.        Objective:   Physical Exam  Constitutional: She is oriented to person, place, and time. She appears well-developed and well-nourished. No distress.  HENT:  Head: Normocephalic and atraumatic.  Right Ear: External ear normal.  Left Ear: External ear normal.  Nose: Nose normal.  Mouth/Throat: Oropharynx is clear and moist. No oropharyngeal exudate.  Eyes: Conjunctivae normal are normal. Pupils are equal, round, and reactive to light. Right eye exhibits no discharge. Left eye exhibits no discharge. No scleral icterus.  Neck: Normal range of motion. Neck supple. No tracheal deviation present. No thyromegaly present.  Cardiovascular: Normal rate, regular rhythm, normal heart sounds and intact distal pulses.  Exam reveals no gallop and no friction rub.   No murmur heard. Pulmonary/Chest: Effort normal and breath sounds normal. No respiratory distress. She has no wheezes. She has no rales. She exhibits no tenderness.  Abdominal: Soft. Bowel sounds are normal. She exhibits no distension. There is no tenderness.  Musculoskeletal: Normal range of motion. She exhibits no edema and no tenderness.  Lymphadenopathy:    She has no cervical adenopathy.  Neurological: She is alert  and oriented to person, place, and time. No cranial nerve deficit. She exhibits normal muscle tone. Coordination normal.  Skin: Skin is warm and dry. No rash noted. She is not diaphoretic. No erythema. No pallor.    Psychiatric: She has a normal mood and affect. Her behavior is normal. Judgment and thought content normal.          Assessment & Plan:

## 2012-01-19 LAB — COMPREHENSIVE METABOLIC PANEL
ALT: 30 U/L (ref 0–35)
AST: 23 U/L (ref 0–37)
Alkaline Phosphatase: 74 U/L (ref 39–117)
CO2: 27 mEq/L (ref 19–32)
Creatinine, Ser: 0.8 mg/dL (ref 0.4–1.2)
GFR: 106.1 mL/min (ref >60.00)
Sodium: 137 mEq/L (ref 135–145)
Total Bilirubin: 0.3 mg/dL (ref 0.3–1.2)
Total Protein: 7.4 g/dL (ref 6.0–8.3)

## 2012-01-19 LAB — TSH: TSH: 0.31 u[IU]/mL — ABNORMAL LOW (ref 0.35–5.50)

## 2012-01-19 LAB — LIPID PANEL
LDL Cholesterol: 62 mg/dL (ref 0–99)
Total CHOL/HDL Ratio: 3

## 2012-01-19 LAB — MICROALBUMIN / CREATININE URINE RATIO
Creatinine,U: 211.7 mg/dL
Microalb Creat Ratio: 0.3 mg/g (ref 0.0–30.0)

## 2012-01-26 ENCOUNTER — Encounter: Payer: Self-pay | Admitting: Internal Medicine

## 2012-02-11 ENCOUNTER — Ambulatory Visit: Payer: Self-pay | Admitting: Internal Medicine

## 2012-03-07 ENCOUNTER — Other Ambulatory Visit: Payer: Self-pay | Admitting: Internal Medicine

## 2012-03-07 NOTE — Telephone Encounter (Signed)
Med filled.  

## 2012-03-13 ENCOUNTER — Ambulatory Visit: Payer: Self-pay | Admitting: Internal Medicine

## 2012-03-25 ENCOUNTER — Encounter: Payer: Self-pay | Admitting: Internal Medicine

## 2012-03-25 ENCOUNTER — Other Ambulatory Visit: Payer: Self-pay | Admitting: Internal Medicine

## 2012-03-25 ENCOUNTER — Other Ambulatory Visit (INDEPENDENT_AMBULATORY_CARE_PROVIDER_SITE_OTHER): Payer: Self-pay

## 2012-03-25 MED ORDER — NORETHINDRONE 0.35 MG PO TABS: 1.0000 | ORAL_TABLET | Freq: Every day | ORAL | Status: DC

## 2012-03-25 MED ORDER — METFORMIN HCL ER 500 MG PO TB24: 1000.0000 mg | ORAL_TABLET | Freq: Two times a day (BID) | ORAL | Status: DC

## 2012-03-27 ENCOUNTER — Other Ambulatory Visit: Payer: Self-pay | Admitting: Internal Medicine

## 2012-04-11 ENCOUNTER — Ambulatory Visit: Payer: No Typology Code available for payment source | Admitting: Internal Medicine

## 2012-04-13 ENCOUNTER — Ambulatory Visit: Payer: Self-pay | Admitting: Internal Medicine

## 2012-04-15 ENCOUNTER — Other Ambulatory Visit: Payer: Self-pay | Admitting: Internal Medicine

## 2012-04-16 ENCOUNTER — Other Ambulatory Visit: Payer: Self-pay | Admitting: Internal Medicine

## 2012-04-17 ENCOUNTER — Other Ambulatory Visit: Payer: Self-pay | Admitting: Internal Medicine

## 2012-05-15 ENCOUNTER — Other Ambulatory Visit: Payer: Self-pay | Admitting: Internal Medicine

## 2012-05-16 NOTE — Telephone Encounter (Signed)
Eprescribed.

## 2012-05-23 ENCOUNTER — Ambulatory Visit: Payer: Self-pay | Admitting: Internal Medicine

## 2012-06-03 ENCOUNTER — Ambulatory Visit: Payer: No Typology Code available for payment source | Admitting: Internal Medicine

## 2012-06-11 ENCOUNTER — Ambulatory Visit: Payer: Self-pay | Admitting: Internal Medicine

## 2012-07-11 ENCOUNTER — Ambulatory Visit: Payer: No Typology Code available for payment source | Admitting: Internal Medicine

## 2012-09-30 ENCOUNTER — Other Ambulatory Visit: Payer: Self-pay | Admitting: Internal Medicine

## 2012-09-30 NOTE — Telephone Encounter (Signed)
Eprescribed.

## 2012-10-03 ENCOUNTER — Other Ambulatory Visit: Payer: Self-pay | Admitting: Internal Medicine

## 2012-10-03 MED ORDER — FLUTICASONE PROPIONATE 0.05 % EX CREA: TOPICAL_CREAM | CUTANEOUS | Status: DC

## 2012-10-03 NOTE — Telephone Encounter (Signed)
Already refilled 09/30/12, confirmed by pharmacy.

## 2012-10-04 ENCOUNTER — Other Ambulatory Visit: Payer: Self-pay | Admitting: Internal Medicine

## 2012-10-24 ENCOUNTER — Ambulatory Visit: Payer: No Typology Code available for payment source | Admitting: Internal Medicine

## 2012-12-04 ENCOUNTER — Ambulatory Visit: Payer: No Typology Code available for payment source | Admitting: Internal Medicine

## 2013-01-01 ENCOUNTER — Other Ambulatory Visit: Payer: Self-pay | Admitting: Internal Medicine

## 2013-01-01 NOTE — Telephone Encounter (Signed)
Appt 01/08/13

## 2013-01-08 ENCOUNTER — Ambulatory Visit: Payer: No Typology Code available for payment source | Admitting: Internal Medicine

## 2013-01-16 ENCOUNTER — Other Ambulatory Visit: Payer: Self-pay

## 2013-01-31 ENCOUNTER — Other Ambulatory Visit: Payer: Self-pay | Admitting: Internal Medicine

## 2013-02-03 NOTE — Telephone Encounter (Signed)
OV 03/11/13

## 2013-03-11 ENCOUNTER — Ambulatory Visit: Payer: No Typology Code available for payment source | Admitting: Internal Medicine

## 2013-03-18 ENCOUNTER — Other Ambulatory Visit: Payer: Self-pay | Admitting: Internal Medicine

## 2013-03-18 NOTE — Telephone Encounter (Signed)
Has cancelled last 7 appointments when refills completed. OV first?

## 2013-03-18 NOTE — Telephone Encounter (Signed)
Yes, must have office visit.

## 2013-03-21 ENCOUNTER — Other Ambulatory Visit: Payer: Self-pay | Admitting: Internal Medicine

## 2013-03-21 NOTE — Telephone Encounter (Signed)
The patient has scheduled her CPE . The soonest I could get her in was 2.16.15 . She will need her birth control pill norethindrone (MICRONOR,CAMILA,ERRIN) 0.35 MG tablet and her lisinopril-hydrochlorothiazide (PRINZIDE,ZESTORETIC) 10-12.5  filled until she can be seen.

## 2013-04-28 ENCOUNTER — Ambulatory Visit (INDEPENDENT_AMBULATORY_CARE_PROVIDER_SITE_OTHER): Payer: No Typology Code available for payment source | Admitting: Internal Medicine

## 2013-04-28 ENCOUNTER — Other Ambulatory Visit (HOSPITAL_COMMUNITY)
Admission: RE | Admit: 2013-04-28 | Discharge: 2013-04-28 | Disposition: A | Discharge status: Home / Self Care | Payer: No Typology Code available for payment source | Source: Ambulatory Visit | Attending: Internal Medicine | Admitting: Internal Medicine

## 2013-04-28 ENCOUNTER — Encounter: Payer: Self-pay | Admitting: Internal Medicine

## 2013-04-28 ENCOUNTER — Other Ambulatory Visit: Payer: Self-pay | Admitting: Internal Medicine

## 2013-04-28 VITALS — BP 140/86 | HR 86 | Temp 98.1°F | Ht 65.0 in | Wt 292.0 lb

## 2013-04-28 DIAGNOSIS — E669 Obesity, unspecified: Secondary | ICD-10-CM

## 2013-04-28 DIAGNOSIS — I10 Essential (primary) hypertension: Secondary | ICD-10-CM

## 2013-04-28 DIAGNOSIS — Z23 Encounter for immunization: Secondary | ICD-10-CM

## 2013-04-28 DIAGNOSIS — Z01419 Encounter for gynecological examination (general) (routine) without abnormal findings: Secondary | ICD-10-CM | POA: Insufficient documentation

## 2013-04-28 DIAGNOSIS — Z113 Encounter for screening for infections with a predominantly sexual mode of transmission: Secondary | ICD-10-CM | POA: Insufficient documentation

## 2013-04-28 DIAGNOSIS — E119 Type 2 diabetes mellitus without complications: Secondary | ICD-10-CM

## 2013-04-28 DIAGNOSIS — Z1151 Encounter for screening for human papillomavirus (HPV): Secondary | ICD-10-CM | POA: Insufficient documentation

## 2013-04-28 DIAGNOSIS — N760 Acute vaginitis: Secondary | ICD-10-CM | POA: Insufficient documentation

## 2013-04-28 DIAGNOSIS — E1169 Type 2 diabetes mellitus with other specified complication: Secondary | ICD-10-CM

## 2013-04-28 DIAGNOSIS — Z0000 Encounter for general adult medical examination without abnormal findings: Secondary | ICD-10-CM

## 2013-04-28 LAB — COMPREHENSIVE METABOLIC PANEL
ALT: 24 U/L (ref 0–35)
AST: 20 U/L (ref 0–37)
Albumin: 3.2 g/dL — ABNORMAL LOW (ref 3.5–5.2)
Alkaline Phosphatase: 84 U/L (ref 39–117)
BUN: 10 mg/dL (ref 6–23)
CALCIUM: 8.7 mg/dL (ref 8.4–10.5)
CHLORIDE: 104 meq/L (ref 96–112)
CO2: 24 mEq/L (ref 19–32)
Creatinine, Ser: 0.7 mg/dL (ref 0.4–1.2)
GFR: 119.16 mL/min (ref >60.00)
Glucose, Bld: 126 mg/dL — ABNORMAL HIGH (ref 70–99)
POTASSIUM: 4.1 meq/L (ref 3.5–5.1)
Sodium: 138 mEq/L (ref 135–145)
Total Bilirubin: 0.6 mg/dL (ref 0.3–1.2)
Total Protein: 7 g/dL (ref 6.0–8.3)

## 2013-04-28 LAB — MICROALBUMIN / CREATININE URINE RATIO
Creatinine,U: 222.4 mg/dL
Microalb Creat Ratio: 0.4 mg/g (ref 0.0–30.0)
Microalb, Ur: 0.8 mg/dL (ref 0.0–1.9)

## 2013-04-28 LAB — LIPID PANEL
CHOL/HDL RATIO: 3
Cholesterol: 126 mg/dL (ref 0–200)
HDL: 48.8 mg/dL (ref >39.00)
LDL CALC: 69 mg/dL (ref 0–99)
TRIGLYCERIDES: 41 mg/dL (ref 0.0–149.0)
VLDL: 8.2 mg/dL (ref 0.0–40.0)

## 2013-04-28 LAB — CBC WITH DIFFERENTIAL/PLATELET
BASOS ABS: 0 10*3/uL (ref 0.0–0.1)
Basophils Relative: 0.6 % (ref 0.0–3.0)
Eosinophils Absolute: 0.1 10*3/uL (ref 0.0–0.7)
Eosinophils Relative: 1.1 % (ref 0.0–5.0)
HCT: 42.2 % (ref 36.0–46.0)
HEMOGLOBIN: 13.9 g/dL (ref 12.0–15.0)
LYMPHS PCT: 32.9 % (ref 12.0–46.0)
Lymphs Abs: 2.3 10*3/uL (ref 0.7–4.0)
MCHC: 32.9 g/dL (ref 30.0–36.0)
MCV: 98.6 fl (ref 78.0–100.0)
MONOS PCT: 6 % (ref 3.0–12.0)
Monocytes Absolute: 0.4 10*3/uL (ref 0.1–1.0)
NEUTROS PCT: 59.4 % (ref 43.0–77.0)
Neutro Abs: 4.2 10*3/uL (ref 1.4–7.7)
Platelets: 455 10*3/uL — ABNORMAL HIGH (ref 150.0–400.0)
RBC: 4.28 Mil/uL (ref 3.87–5.11)
RDW: 13.5 % (ref 11.5–14.6)
WBC: 7.1 10*3/uL (ref 4.5–10.5)

## 2013-04-28 LAB — HEMOGLOBIN A1C: Hgb A1c MFr Bld: 7.1 % — ABNORMAL HIGH (ref 4.6–6.5)

## 2013-04-28 LAB — TSH: TSH: 1.72 u[IU]/mL (ref 0.35–5.50)

## 2013-04-28 NOTE — Progress Notes (Signed)
Pre-visit discussion using our clinic review tool. No additional management support is needed unless otherwise documented below in the visit note.  

## 2013-04-28 NOTE — Assessment & Plan Note (Signed)
Lab Results  Component Value Date   HGBA1C 6.3 01/18/2012   Pt has been lost to follow up for > 1 year. Encouraged compliance with follow up. Will continue Metformin. Will check A1c with labs today. Foot exam normal today.

## 2013-04-28 NOTE — Assessment & Plan Note (Addendum)
General medical exam normal including breast and pelvic exam except as noted. PAP is pending. Encouraged better compliance with follow up. Flu Vaccine and Pneumovax given today. Will check labs including CBC, CMP, lipids, TSH, Vit D.

## 2013-04-28 NOTE — Assessment & Plan Note (Addendum)
BP Readings from Last 3 Encounters:  04/28/13 140/86  01/18/12 130/80  10/13/11 110/72   BP elevated today. Will check renal function with labs. Continue lisinopril. Follow up recheck BP in 4 weeks.

## 2013-04-28 NOTE — Assessment & Plan Note (Signed)
Wt Readings from Last 3 Encounters:  04/28/13 292 lb (132.45 kg)  01/18/12 274 lb 8 oz (124.512 kg)  10/13/11 278 lb 4 oz (126.213 kg)   Body mass index is 48.59 kg/(m^2).  Encouraged better compliance with diet and exercise, with goal of weight loss. Will check thyroid function with labs today.

## 2013-04-28 NOTE — Progress Notes (Signed)
Subjective:    Patient ID: Veronica Norton, female    DOB: Jun 21, 1975, 38 y.o.   MRN: [id number]  HPI 38YO female with DM presents for physical exam. Lost to follow up>1 year. Reports compliance with meds, but has not been checking BG. Notes dietary indiscretion. Not currently exercising.   Review of Systems  Constitutional: Negative for fever, chills, appetite change, fatigue and unexpected weight change.  HENT: Negative for congestion, ear pain, sinus pressure, sore throat, trouble swallowing and voice change.   Eyes: Negative for visual disturbance.  Respiratory: Negative for cough, shortness of breath, wheezing and stridor.   Cardiovascular: Negative for chest pain, palpitations and leg swelling.  Gastrointestinal: Negative for nausea, vomiting, abdominal pain, diarrhea, constipation, blood in stool, abdominal distention and anal bleeding.  Genitourinary: Negative for dysuria and flank pain.  Musculoskeletal: Negative for arthralgias, gait problem, myalgias and neck pain.  Skin: Negative for color change and rash.  Neurological: Negative for dizziness and headaches.  Hematological: Negative for adenopathy. Does not bruise/bleed easily.  Psychiatric/Behavioral: Negative for suicidal ideas, sleep disturbance and dysphoric mood. The patient is not nervous/anxious.        Objective:    BP 140/86  Pulse 86  Temp(Src) 98.1 F (36.7 C) (Oral)  Ht 5\' 5"  (1.651 m)  Wt 292 lb (132.45 kg)  BMI 48.59 kg/m2  SpO2 97%  LMP 04/21/2013 Physical Exam  Constitutional: She is oriented to person, place, and time. She appears well-developed and well-nourished. No distress.  obese  HENT:  Head: Normocephalic and atraumatic.  Right Ear: External ear normal.  Left Ear: External ear normal.  Nose: Nose normal.  Mouth/Throat: Oropharynx is clear and moist. No oropharyngeal exudate.  Eyes: Conjunctivae are normal. Pupils are equal, round, and reactive to light. Right eye exhibits no discharge.  Left eye exhibits no discharge. No scleral icterus.  Neck: Normal range of motion. Neck supple. No tracheal deviation present. No thyromegaly present.  Cardiovascular: Normal rate, regular rhythm, normal heart sounds and intact distal pulses.  Exam reveals no gallop and no friction rub.   No murmur heard. Pulmonary/Chest: Effort normal and breath sounds normal. No respiratory distress. She has no wheezes. She has no rales. She exhibits no tenderness.  Abdominal: Soft. Bowel sounds are normal. She exhibits no distension and no mass. There is no tenderness. There is no rebound and no guarding.  Genitourinary: Rectum normal, vagina normal and uterus normal. No breast swelling, tenderness, discharge or bleeding. Pelvic exam was performed with patient supine. There is no rash, tenderness or lesion on the right labia. There is no rash, tenderness or lesion on the left labia. Uterus is not enlarged and not tender. Cervix exhibits no motion tenderness, no discharge and no friability. Right adnexum displays no mass, no tenderness and no fullness. Left adnexum displays no mass, no tenderness and no fullness. No erythema or tenderness around the vagina. No vaginal discharge found.  Musculoskeletal: Normal range of motion. She exhibits no edema and no tenderness.  Lymphadenopathy:    She has no cervical adenopathy.  Neurological: She is alert and oriented to person, place, and time. No cranial nerve deficit. She exhibits normal muscle tone. Coordination normal.  Skin: Skin is warm and dry. No rash noted. She is not diaphoretic. No erythema. No pallor.  Psychiatric: She has a normal mood and affect. Her behavior is normal. Judgment and thought content normal.          Assessment & Plan:   Problem List Items Addressed  This Visit   Diabetes mellitus type 2 in obese      Lab Results  Component Value Date   HGBA1C 6.3 01/18/2012   Pt has been lost to follow up for > 1 year. Encouraged compliance with  follow up. Will continue Metformin. Will check A1c with labs today. Foot exam normal today.    Hypertension      BP Readings from Last 3 Encounters:  04/28/13 140/86  01/18/12 130/80  10/13/11 110/72   BP elevated today. Will check renal function with labs. Continue lisinopril. Follow up recheck BP in 4 weeks.    Obesity (BMI 30-39.9)      Wt Readings from Last 3 Encounters:  04/28/13 292 lb (132.45 kg)  01/18/12 274 lb 8 oz (124.512 kg)  10/13/11 278 lb 4 oz (126.213 kg)   Body mass index is 48.59 kg/(m^2).  Encouraged better compliance with diet and exercise, with goal of weight loss. Will check thyroid function with labs today.    Routine general medical examination at a health care facility - Primary     General medical exam normal including breast and pelvic exam except as noted. PAP is pending. Encouraged better compliance with follow up. Flu Vaccine and Pneumovax given today. Will check labs including CBC, CMP, lipids, TSH, Vit D.     Relevant Orders      Comprehensive metabolic panel      Hemoglobin A1c      Lipid panel      Microalbumin / creatinine urine ratio      CBC with Differential      Vit D  25 hydroxy (rtn osteoporosis monitoring)      TSH      Cytology - PAP    Other Visit Diagnoses   Need for prophylactic vaccination against Streptococcus pneumoniae (pneumococcus)        Relevant Orders       Pneumococcal polysaccharide vaccine 23-valent greater than or equal to 2yo subcutaneous/IM (Completed)    Need for prophylactic vaccination and inoculation against influenza        Relevant Orders       Flu Vaccine QUAD 36+ mos PF IM (Fluarix) (Completed)        Return in about 3 months (around 07/26/2013) for Recheck of Diabetes.

## 2013-04-29 LAB — VITAMIN D 25 HYDROXY (VIT D DEFICIENCY, FRACTURES): VIT D 25 HYDROXY: 20 ng/mL — AB (ref 30–89)

## 2013-04-30 ENCOUNTER — Telehealth: Payer: Self-pay | Admitting: Internal Medicine

## 2013-04-30 ENCOUNTER — Encounter: Payer: Self-pay | Admitting: Internal Medicine

## 2013-04-30 LAB — CERVICOVAGINAL ANCILLARY ONLY
BACTERIAL VAGINITIS: POSITIVE — AB
Bacterial vaginitis: POSITIVE — AB
Candida vaginitis: NEGATIVE

## 2013-04-30 MED ORDER — LISINOPRIL-HYDROCHLOROTHIAZIDE 10-12.5 MG PO TABS: ORAL_TABLET | ORAL | Status: DC

## 2013-04-30 MED ORDER — METRONIDAZOLE 500 MG PO TABS: 500.0000 mg | ORAL_TABLET | Freq: Two times a day (BID) | ORAL | Status: DC

## 2013-04-30 NOTE — Telephone Encounter (Signed)
Relevant patient education assigned to patient using Emmi. ° °

## 2013-05-02 ENCOUNTER — Telehealth: Payer: Self-pay

## 2013-05-02 NOTE — Telephone Encounter (Signed)
Relevant patient education assigned to patient using Emmi. ° °

## 2013-05-05 ENCOUNTER — Other Ambulatory Visit: Payer: Self-pay | Admitting: Internal Medicine

## 2013-05-05 ENCOUNTER — Encounter: Payer: Self-pay | Admitting: Adult Health

## 2013-05-05 LAB — CERVICOVAGINAL ANCILLARY ONLY: HERPES (WINDOWPATH): NEGATIVE

## 2013-08-25 ENCOUNTER — Other Ambulatory Visit: Payer: Self-pay | Admitting: Internal Medicine

## 2013-08-25 NOTE — Telephone Encounter (Signed)
See mychart message, ok refill?

## 2013-10-09 ENCOUNTER — Encounter: Payer: Self-pay | Admitting: *Deleted

## 2013-10-13 ENCOUNTER — Other Ambulatory Visit: Payer: Self-pay | Admitting: Internal Medicine

## 2013-10-13 NOTE — Telephone Encounter (Signed)
Ok refill? 

## 2013-10-29 ENCOUNTER — Ambulatory Visit (INDEPENDENT_AMBULATORY_CARE_PROVIDER_SITE_OTHER): Payer: No Typology Code available for payment source | Admitting: Internal Medicine

## 2013-10-29 ENCOUNTER — Encounter: Payer: Self-pay | Admitting: Internal Medicine

## 2013-10-29 VITALS — BP 108/80 | HR 89 | Temp 98.5°F | Ht 65.0 in | Wt 298.5 lb

## 2013-10-29 DIAGNOSIS — E1169 Type 2 diabetes mellitus with other specified complication: Secondary | ICD-10-CM

## 2013-10-29 DIAGNOSIS — E669 Obesity, unspecified: Secondary | ICD-10-CM

## 2013-10-29 DIAGNOSIS — I10 Essential (primary) hypertension: Secondary | ICD-10-CM

## 2013-10-29 DIAGNOSIS — E119 Type 2 diabetes mellitus without complications: Secondary | ICD-10-CM

## 2013-10-29 LAB — LIPID PANEL
Cholesterol: 134 mg/dL (ref 0–200)
HDL: 47.4 mg/dL (ref >39.00)
LDL Cholesterol: 72 mg/dL (ref 0–99)
NonHDL: 86.6
TRIGLYCERIDES: 71 mg/dL (ref 0.0–149.0)
Total CHOL/HDL Ratio: 3
VLDL: 14.2 mg/dL (ref 0.0–40.0)

## 2013-10-29 LAB — COMPREHENSIVE METABOLIC PANEL
ALBUMIN: 3.4 g/dL — AB (ref 3.5–5.2)
ALK PHOS: 85 U/L (ref 39–117)
ALT: 25 U/L (ref 0–35)
AST: 22 U/L (ref 0–37)
BUN: 9 mg/dL (ref 6–23)
CALCIUM: 9 mg/dL (ref 8.4–10.5)
CHLORIDE: 99 meq/L (ref 96–112)
CO2: 29 mEq/L (ref 19–32)
Creatinine, Ser: 0.7 mg/dL (ref 0.4–1.2)
GFR: 115.08 mL/min (ref >60.00)
Glucose, Bld: 109 mg/dL — ABNORMAL HIGH (ref 70–99)
POTASSIUM: 4.1 meq/L (ref 3.5–5.1)
SODIUM: 133 meq/L — AB (ref 135–145)
Total Bilirubin: 0.4 mg/dL (ref 0.2–1.2)
Total Protein: 7.1 g/dL (ref 6.0–8.3)

## 2013-10-29 LAB — MICROALBUMIN / CREATININE URINE RATIO
Creatinine,U: 168.6 mg/dL
MICROALB/CREAT RATIO: 0.2 mg/g (ref 0.0–30.0)
Microalb, Ur: 0.3 mg/dL (ref 0.0–1.9)

## 2013-10-29 LAB — HEMOGLOBIN A1C: Hgb A1c MFr Bld: 7.5 % — ABNORMAL HIGH (ref 4.6–6.5)

## 2013-10-29 LAB — HM DIABETES FOOT EXAM: HM Diabetic Foot Exam: NORMAL

## 2013-10-29 LAB — TSH: TSH: 1.28 u[IU]/mL (ref 0.35–4.50)

## 2013-10-29 MED ORDER — LISINOPRIL-HYDROCHLOROTHIAZIDE 10-12.5 MG PO TABS: ORAL_TABLET | ORAL | Status: DC

## 2013-10-29 MED ORDER — METFORMIN HCL ER 500 MG PO TB24: ORAL_TABLET | ORAL | Status: DC

## 2013-10-29 MED ORDER — LINAGLIPTIN 5 MG PO TABS: 5.0000 mg | ORAL_TABLET | Freq: Every day | ORAL | Status: DC

## 2013-10-29 NOTE — Progress Notes (Signed)
Pre visit review using our clinic review tool, if applicable. No additional management support is needed unless otherwise documented below in the visit note. 

## 2013-10-29 NOTE — Addendum Note (Signed)
Addended by: Ronette Deter A on: 10/29/2013 07:55 PM   Modules accepted: Orders

## 2013-10-29 NOTE — Progress Notes (Signed)
Subjective:    Patient ID: Veronica Norton, female    DOB: 1976/01/23, 38 y.o.   MRN: [id number]  HPI 38YO female presents for follow up.  Concerned about recent weight gain. Not following any diet or exercise program.  DM - Compliant with meds. Not recently checking blood sugars.  Review of Systems  Constitutional: Negative for fever, chills, appetite change, fatigue and unexpected weight change.  Eyes: Negative for visual disturbance.  Respiratory: Negative for shortness of breath.   Cardiovascular: Negative for chest pain and leg swelling.  Gastrointestinal: Negative for nausea, vomiting, abdominal pain, diarrhea and constipation.  Skin: Negative for color change and rash.  Hematological: Negative for adenopathy. Does not bruise/bleed easily.  Psychiatric/Behavioral: Negative for dysphoric mood. The patient is not nervous/anxious.        Objective:    BP 108/80  Pulse 89  Temp(Src) 98.5 F (36.9 C) (Oral)  Ht 5\' 5"  (1.651 m)  Wt 298 lb 8 oz (135.399 kg)  BMI 49.67 kg/m2  SpO2 95%  LMP 10/12/2013 Physical Exam  Constitutional: She is oriented to person, place, and time. She appears well-developed and well-nourished. No distress.  HENT:  Head: Normocephalic and atraumatic.  Right Ear: External ear normal.  Left Ear: External ear normal.  Nose: Nose normal.  Mouth/Throat: Oropharynx is clear and moist. No oropharyngeal exudate.  Eyes: Conjunctivae are normal. Pupils are equal, round, and reactive to light. Right eye exhibits no discharge. Left eye exhibits no discharge. No scleral icterus.  Neck: Normal range of motion. Neck supple. No tracheal deviation present. No thyromegaly present.  Cardiovascular: Normal rate, regular rhythm, normal heart sounds and intact distal pulses.  Exam reveals no gallop and no friction rub.   No murmur heard. Pulmonary/Chest: Effort normal and breath sounds normal. No accessory muscle usage. Not tachypneic. No respiratory distress. She has  no decreased breath sounds. She has no wheezes. She has no rhonchi. She has no rales. She exhibits no tenderness.  Musculoskeletal: Normal range of motion. She exhibits no edema and no tenderness.  Lymphadenopathy:    She has no cervical adenopathy.  Neurological: She is alert and oriented to person, place, and time. No cranial nerve deficit. She exhibits normal muscle tone. Coordination normal.  Skin: Skin is warm and dry. No rash noted. She is not diaphoretic. No erythema. No pallor.  Psychiatric: She has a normal mood and affect. Her behavior is normal. Judgment and thought content normal.          Assessment & Plan:   Problem List Items Addressed This Visit     Unprioritized   Diabetes mellitus type 2 in obese - Primary      Lab Results  Component Value Date   HGBA1C 7.1* 04/28/2013   Will check A1c with labs today. Continue metformin. Foot exam normal today. Encouraged her to get Eye exam.    Relevant Medications      metFORMIN (GLUCOPHAGE-XR) 24 hr tablet      lisinopril-hydrochlorothiazide (PRINZIDE,ZESTORETIC) 10-12.5 MG per tablet   Other Relevant Orders      Comprehensive metabolic panel      Hemoglobin A1c      Lipid panel      Microalbumin / creatinine urine ratio      TSH   Hypertension      BP Readings from Last 3 Encounters:  10/29/13 108/80  04/28/13 140/86  01/18/12 130/80   BP well controlled. Continue Lisinopril-HCTZ. Renal function with labs today.    Relevant  Medications      lisinopril-hydrochlorothiazide (PRINZIDE,ZESTORETIC) 10-12.5 MG per tablet   Obesity (BMI 30-39.9)      Wt Readings from Last 3 Encounters:  10/29/13 298 lb 8 oz (135.399 kg)  04/28/13 292 lb (132.45 kg)  01/18/12 274 lb 8 oz (124.512 kg)   Body mass index is 49.67 kg/(m^2). Encouraged healthy diet and exercise with goal of weight loss.    Relevant Medications      metFORMIN (GLUCOPHAGE-XR) 24 hr tablet       Return in about 3 months (around 01/29/2014) for  Recheck of Diabetes.

## 2013-10-29 NOTE — Assessment & Plan Note (Signed)
Lab Results  Component Value Date   HGBA1C 7.1* 04/28/2013   Will check A1c with labs today. Continue metformin. Foot exam normal today. Encouraged her to get Eye exam.

## 2013-10-29 NOTE — Patient Instructions (Signed)
Labs today.  Follow up in 3 months and as needed. 

## 2013-10-29 NOTE — Assessment & Plan Note (Signed)
Wt Readings from Last 3 Encounters:  10/29/13 298 lb 8 oz (135.399 kg)  04/28/13 292 lb (132.45 kg)  01/18/12 274 lb 8 oz (124.512 kg)   Body mass index is 49.67 kg/(m^2). Encouraged healthy diet and exercise with goal of weight loss.

## 2013-10-29 NOTE — Assessment & Plan Note (Signed)
BP Readings from Last 3 Encounters:  10/29/13 108/80  04/28/13 140/86  01/18/12 130/80   BP well controlled. Continue Lisinopril-HCTZ. Renal function with labs today.

## 2013-10-30 ENCOUNTER — Telehealth: Payer: Self-pay | Admitting: *Deleted

## 2013-10-30 NOTE — Telephone Encounter (Signed)
Pt called states Tradjenta is too expensive.  Rx discount card left at front desk for pt to pick up.

## 2013-10-31 ENCOUNTER — Telehealth: Payer: Self-pay | Admitting: *Deleted

## 2013-10-31 MED ORDER — GLUCOSE BLOOD VI STRP: ORAL_STRIP | Status: DC

## 2013-10-31 MED ORDER — ONETOUCH ULTRASOFT LANCETS MISC: Status: DC

## 2013-10-31 MED ORDER — FREESTYLE SYSTEM KIT: PACK | Status: DC

## 2013-10-31 MED ORDER — ONETOUCH ULTRA SYSTEM W/DEVICE KIT: PACK | Status: DC

## 2013-10-31 NOTE — Telephone Encounter (Signed)
Pt called, needing glucometer, strips, and lancets sent to [facility]. Checked with insurance, prefers One Touch Ultra mini. Rx sent to pharmacy by escript

## 2013-10-31 NOTE — Addendum Note (Signed)
Addended by: Wynonia Lawman E on: 10/31/2013 03:59 PM   Modules accepted: Orders

## 2013-10-31 NOTE — Telephone Encounter (Signed)
Fax from pharmacy, PA needed for One Touch. Spoke to [place] rep, her plan prefers Freestyle or Precision products. Sent to pharm with new Rxs. Pt notified.

## 2013-11-03 ENCOUNTER — Encounter: Payer: Self-pay | Admitting: Internal Medicine

## 2013-12-03 ENCOUNTER — Encounter: Payer: Self-pay | Admitting: Internal Medicine

## 2013-12-03 ENCOUNTER — Ambulatory Visit (INDEPENDENT_AMBULATORY_CARE_PROVIDER_SITE_OTHER): Payer: No Typology Code available for payment source | Admitting: Internal Medicine

## 2013-12-03 VITALS — BP 120/76 | HR 88 | Temp 97.5°F | Ht 65.0 in | Wt 295.0 lb

## 2013-12-03 DIAGNOSIS — E1169 Type 2 diabetes mellitus with other specified complication: Secondary | ICD-10-CM

## 2013-12-03 DIAGNOSIS — E119 Type 2 diabetes mellitus without complications: Secondary | ICD-10-CM

## 2013-12-03 DIAGNOSIS — E669 Obesity, unspecified: Secondary | ICD-10-CM

## 2013-12-03 DIAGNOSIS — Z23 Encounter for immunization: Secondary | ICD-10-CM

## 2013-12-03 DIAGNOSIS — I10 Essential (primary) hypertension: Secondary | ICD-10-CM

## 2013-12-03 NOTE — Progress Notes (Signed)
Subjective:    Patient ID: Veronica Norton, female    DOB: 1975-10-07, 38 y.o.   MRN: [id number]  HPI 38YO female presents for follow up.  DM - Unable to afford Tradjenta.Continues on Metformin. Started healthier diet and is exercising 3x per week. BG running 90s-120s. Has lost 3lbs.  Review of Systems  Constitutional: Negative for fever, chills, appetite change, fatigue and unexpected weight change.  Eyes: Negative for visual disturbance.  Respiratory: Negative for shortness of breath.   Cardiovascular: Negative for chest pain and leg swelling.  Gastrointestinal: Negative for nausea, vomiting, abdominal pain, diarrhea and constipation.  Skin: Negative for color change and rash.  Hematological: Negative for adenopathy. Does not bruise/bleed easily.  Psychiatric/Behavioral: Negative for dysphoric mood. The patient is not nervous/anxious.        Objective:    BP 120/76  Pulse 88  Temp(Src) 97.5 F (36.4 C) (Oral)  Ht 5\' 5"  (1.651 m)  Wt 295 lb (133.811 kg)  BMI 49.09 kg/m2  SpO2 96%  LMP 11/13/2013 Physical Exam  Constitutional: She is oriented to person, place, and time. She appears well-developed and well-nourished. No distress.  HENT:  Head: Normocephalic and atraumatic.  Right Ear: External ear normal.  Left Ear: External ear normal.  Nose: Nose normal.  Mouth/Throat: Oropharynx is clear and moist. No oropharyngeal exudate.  Eyes: Conjunctivae are normal. Pupils are equal, round, and reactive to light. Right eye exhibits no discharge. Left eye exhibits no discharge. No scleral icterus.  Neck: Normal range of motion. Neck supple. No tracheal deviation present. No thyromegaly present.  Cardiovascular: Normal rate, regular rhythm, normal heart sounds and intact distal pulses.  Exam reveals no gallop and no friction rub.   No murmur heard. Pulmonary/Chest: Effort normal and breath sounds normal. No accessory muscle usage. Not tachypneic. No respiratory distress. She has no  decreased breath sounds. She has no wheezes. She has no rhonchi. She has no rales. She exhibits no tenderness.  Musculoskeletal: Normal range of motion. She exhibits no edema and no tenderness.  Lymphadenopathy:    She has no cervical adenopathy.  Neurological: She is alert and oriented to person, place, and time. No cranial nerve deficit. She exhibits normal muscle tone. Coordination normal.  Skin: Skin is warm and dry. No rash noted. She is not diaphoretic. No erythema. No pallor.  Psychiatric: She has a normal mood and affect. Her behavior is normal. Judgment and thought content normal.          Assessment & Plan:   Problem List Items Addressed This Visit     Unprioritized   Diabetes mellitus type 2 in obese - Primary     BG are improved. Encouraged continued healthy diet and exercise. Plan to repeat A1c in 01/2014.    Relevant Orders      Comprehensive metabolic panel      Hemoglobin A1c      Lipid panel      Microalbumin / creatinine urine ratio   Hypertension      BP Readings from Last 3 Encounters:  12/03/13 120/76  10/29/13 108/80  04/28/13 140/86   BP well controlled on current medications. Will check renal function with labs in 2 months. Confirmed pt on OCP while on Lisinopril.    Obesity (BMI 30-39.9)      Wt Readings from Last 3 Encounters:  12/03/13 295 lb (133.811 kg)  10/29/13 298 lb 8 oz (135.399 kg)  04/28/13 292 lb (132.45 kg)   Body mass index is  49.09 kg/(m^2). Encouraged continued healthy diet and exercise with goal of weight loss.     Other Visit Diagnoses   Need for prophylactic vaccination and inoculation against influenza            Return in about 2 months (around 02/02/2014) for Recheck of Diabetes.

## 2013-12-03 NOTE — Assessment & Plan Note (Signed)
BP Readings from Last 3 Encounters:  12/03/13 120/76  10/29/13 108/80  04/28/13 140/86   BP well controlled on current medications. Will check renal function with labs in 2 months. Confirmed pt on OCP while on Lisinopril.

## 2013-12-03 NOTE — Patient Instructions (Signed)
Labs prior to next visit.  Follow up in 01/2014.

## 2013-12-03 NOTE — Assessment & Plan Note (Signed)
BG are improved. Encouraged continued healthy diet and exercise. Plan to repeat A1c in 01/2014.

## 2013-12-03 NOTE — Progress Notes (Signed)
Pre visit review using our clinic review tool, if applicable. No additional management support is needed unless otherwise documented below in the visit note. 

## 2013-12-03 NOTE — Assessment & Plan Note (Signed)
Wt Readings from Last 3 Encounters:  12/03/13 295 lb (133.811 kg)  10/29/13 298 lb 8 oz (135.399 kg)  04/28/13 292 lb (132.45 kg)   Body mass index is 49.09 kg/(m^2). Encouraged continued healthy diet and exercise with goal of weight loss.

## 2014-02-02 ENCOUNTER — Ambulatory Visit: Payer: No Typology Code available for payment source | Admitting: Internal Medicine

## 2014-03-05 ENCOUNTER — Ambulatory Visit (INDEPENDENT_AMBULATORY_CARE_PROVIDER_SITE_OTHER): Payer: BC Managed Care – PPO | Admitting: Internal Medicine

## 2014-03-05 ENCOUNTER — Encounter: Payer: Self-pay | Admitting: Internal Medicine

## 2014-03-05 VITALS — BP 133/71 | HR 79 | Temp 97.5°F | Ht 65.0 in | Wt 288.5 lb

## 2014-03-05 DIAGNOSIS — I10 Essential (primary) hypertension: Secondary | ICD-10-CM

## 2014-03-05 DIAGNOSIS — E1169 Type 2 diabetes mellitus with other specified complication: Secondary | ICD-10-CM

## 2014-03-05 DIAGNOSIS — E669 Obesity, unspecified: Secondary | ICD-10-CM

## 2014-03-05 DIAGNOSIS — E119 Type 2 diabetes mellitus without complications: Secondary | ICD-10-CM

## 2014-03-05 LAB — COMPREHENSIVE METABOLIC PANEL
ALT: 18 U/L (ref 0–35)
AST: 15 U/L (ref 0–37)
Albumin: 3.4 g/dL — ABNORMAL LOW (ref 3.5–5.2)
Alkaline Phosphatase: 76 U/L (ref 39–117)
BUN: 12 mg/dL (ref 6–23)
CO2: 25 mEq/L (ref 19–32)
Calcium: 8.8 mg/dL (ref 8.4–10.5)
Chloride: 103 mEq/L (ref 96–112)
Creatinine, Ser: 0.6 mg/dL (ref 0.4–1.2)
GFR: 136.15 mL/min (ref >60.00)
Glucose, Bld: 139 mg/dL — ABNORMAL HIGH (ref 70–99)
Potassium: 4.1 mEq/L (ref 3.5–5.1)
Sodium: 135 mEq/L (ref 135–145)
Total Bilirubin: 0.4 mg/dL (ref 0.2–1.2)
Total Protein: 7 g/dL (ref 6.0–8.3)

## 2014-03-05 LAB — MICROALBUMIN / CREATININE URINE RATIO
CREATININE, U: 284.2 mg/dL
MICROALB UR: 1.5 mg/dL (ref 0.0–1.9)
MICROALB/CREAT RATIO: 0.5 mg/g (ref 0.0–30.0)

## 2014-03-05 LAB — LIPID PANEL
Cholesterol: 127 mg/dL (ref 0–200)
HDL: 39.4 mg/dL (ref >39.00)
LDL Cholesterol: 76 mg/dL (ref 0–99)
NonHDL: 87.6
Total CHOL/HDL Ratio: 3
Triglycerides: 57 mg/dL (ref 0.0–149.0)
VLDL: 11.4 mg/dL (ref 0.0–40.0)

## 2014-03-05 LAB — HEMOGLOBIN A1C: Hgb A1c MFr Bld: 7.6 % — ABNORMAL HIGH (ref 4.6–6.5)

## 2014-03-05 MED ORDER — LISINOPRIL-HYDROCHLOROTHIAZIDE 10-12.5 MG PO TABS: ORAL_TABLET | ORAL | Status: DC

## 2014-03-05 NOTE — Assessment & Plan Note (Signed)
BP Readings from Last 3 Encounters:  03/05/14 133/71  12/03/13 120/76  10/29/13 108/80   BP well controlled. Will continue Lisinopril-HCTZ. Renal function with labs today.

## 2014-03-05 NOTE — Progress Notes (Signed)
Pre visit review using our clinic review tool, if applicable. No additional management support is needed unless otherwise documented below in the visit note. 

## 2014-03-05 NOTE — Patient Instructions (Signed)
Labs today.  Follow up in 3 months for physical.

## 2014-03-05 NOTE — Assessment & Plan Note (Signed)
Wt Readings from Last 3 Encounters:  03/05/14 288 lb 8 oz (130.863 kg)  12/03/13 295 lb (133.811 kg)  10/29/13 298 lb 8 oz (135.399 kg)   Body mass index is 48.01 kg/(m^2). Congratulated pt on weight loss. Encouraged healthy diet and exercise.

## 2014-03-05 NOTE — Progress Notes (Signed)
Subjective:    Patient ID: Veronica Norton, female    DOB: [birth year], 38 y.o.   MRN: [id number]  HPI 38YO female presents for follow up.  DM - BG have been well controlled when checked, last checked 2 weeks ago. Compliant with meds.  Wt Readings from Last 3 Encounters:  03/05/14 288 lb 8 oz (130.863 kg)  12/03/13 295 lb (133.811 kg)  10/29/13 298 lb 8 oz (135.399 kg)   Went to [facility] on 12/10 for sinus infection. Treated with Augmentin. Symptoms improved.   Past medical, surgical, family and social history per today's encounter.  Review of Systems  Constitutional: Negative for fever, chills, appetite change, fatigue and unexpected weight change.  HENT: Negative for congestion and sinus pressure.   Eyes: Negative for visual disturbance.  Respiratory: Negative for shortness of breath.   Cardiovascular: Negative for chest pain and leg swelling.  Gastrointestinal: Negative for nausea, vomiting, abdominal pain, diarrhea and constipation.  Skin: Negative for color change and rash.  Hematological: Negative for adenopathy. Does not bruise/bleed easily.  Psychiatric/Behavioral: Negative for sleep disturbance and dysphoric mood. The patient is not nervous/anxious.        Objective:    BP 133/71 mmHg  Pulse 79  Temp(Src) 97.5 F (36.4 C) (Oral)  Ht 5\' 5"  (1.651 m)  Wt 288 lb 8 oz (130.863 kg)  BMI 48.01 kg/m2  SpO2 98%  LMP 01/26/2014 Physical Exam  Constitutional: She is oriented to person, place, and time. She appears well-developed and well-nourished. No distress.  HENT:  Head: Normocephalic and atraumatic.  Right Ear: External ear normal.  Left Ear: External ear normal.  Nose: Nose normal.  Mouth/Throat: Oropharynx is clear and moist. No oropharyngeal exudate.  Eyes: Conjunctivae are normal. Pupils are equal, round, and reactive to light. Right eye exhibits no discharge. Left eye exhibits no discharge. No scleral icterus.  Neck: Normal range of motion. Neck  supple. No tracheal deviation present. No thyromegaly present.  Cardiovascular: Normal rate, regular rhythm, normal heart sounds and intact distal pulses.  Exam reveals no gallop and no friction rub.   No murmur heard. Pulmonary/Chest: Effort normal and breath sounds normal. No accessory muscle usage. No tachypnea. No respiratory distress. She has no decreased breath sounds. She has no wheezes. She has no rhonchi. She has no rales. She exhibits no tenderness.  Musculoskeletal: Normal range of motion. She exhibits no edema or tenderness.  Lymphadenopathy:    She has no cervical adenopathy.  Neurological: She is alert and oriented to person, place, and time. No cranial nerve deficit. She exhibits normal muscle tone. Coordination normal.  Skin: Skin is warm and dry. No rash noted. She is not diaphoretic. No erythema. No pallor.  Psychiatric: She has a normal mood and affect. Her behavior is normal. Judgment and thought content normal.          Assessment & Plan:   Problem List Items Addressed This Visit      Unprioritized   Diabetes mellitus type 2 in obese - Primary    Will check A1c with labs today. Continue Metformin.    Relevant Medications      lisinopril-hydrochlorothiazide (PRINZIDE,ZESTORETIC) 10-12.5 MG per tablet   Hypertension    BP Readings from Last 3 Encounters:  03/05/14 133/71  12/03/13 120/76  10/29/13 108/80   BP well controlled. Will continue Lisinopril-HCTZ. Renal function with labs today.    Relevant Medications      lisinopril-hydrochlorothiazide (PRINZIDE,ZESTORETIC) 10-12.5 MG per tablet   Obesity (  BMI 30-39.9)    Wt Readings from Last 3 Encounters:  03/05/14 288 lb 8 oz (130.863 kg)  12/03/13 295 lb (133.811 kg)  10/29/13 298 lb 8 oz (135.399 kg)   Body mass index is 48.01 kg/(m^2). Congratulated pt on weight loss. Encouraged healthy diet and exercise.        Return in about 3 months (around 06/04/2014) for Physical.

## 2014-03-05 NOTE — Assessment & Plan Note (Signed)
Will check A1c with labs today. Continue Metformin. 

## 2014-05-05 ENCOUNTER — Other Ambulatory Visit: Payer: Self-pay | Admitting: Internal Medicine

## 2014-05-29 ENCOUNTER — Ambulatory Visit (INDEPENDENT_AMBULATORY_CARE_PROVIDER_SITE_OTHER): Payer: BLUE CROSS/BLUE SHIELD | Admitting: Internal Medicine

## 2014-05-29 ENCOUNTER — Encounter: Payer: Self-pay | Admitting: Internal Medicine

## 2014-05-29 ENCOUNTER — Other Ambulatory Visit (HOSPITAL_COMMUNITY)
Admission: RE | Admit: 2014-05-29 | Discharge: 2014-05-29 | Disposition: A | Discharge status: Home / Self Care | Payer: BLUE CROSS/BLUE SHIELD | Source: Ambulatory Visit | Attending: Internal Medicine | Admitting: Internal Medicine

## 2014-05-29 VITALS — BP 125/85 | HR 82 | Temp 97.9°F | Ht 65.0 in | Wt 292.4 lb

## 2014-05-29 DIAGNOSIS — E119 Type 2 diabetes mellitus without complications: Secondary | ICD-10-CM

## 2014-05-29 DIAGNOSIS — Z01419 Encounter for gynecological examination (general) (routine) without abnormal findings: Secondary | ICD-10-CM | POA: Diagnosis present

## 2014-05-29 DIAGNOSIS — Z1151 Encounter for screening for human papillomavirus (HPV): Secondary | ICD-10-CM | POA: Diagnosis present

## 2014-05-29 DIAGNOSIS — E669 Obesity, unspecified: Secondary | ICD-10-CM

## 2014-05-29 DIAGNOSIS — Z0000 Encounter for general adult medical examination without abnormal findings: Secondary | ICD-10-CM

## 2014-05-29 DIAGNOSIS — E1169 Type 2 diabetes mellitus with other specified complication: Secondary | ICD-10-CM

## 2014-05-29 DIAGNOSIS — I10 Essential (primary) hypertension: Secondary | ICD-10-CM

## 2014-05-29 LAB — LIPID PANEL
Cholesterol: 136 mg/dL (ref 0–200)
HDL: 48.4 mg/dL (ref >39.00)
LDL Cholesterol: 76 mg/dL (ref 0–99)
NonHDL: 87.6
TRIGLYCERIDES: 60 mg/dL (ref 0.0–149.0)
Total CHOL/HDL Ratio: 3
VLDL: 12 mg/dL (ref 0.0–40.0)

## 2014-05-29 LAB — CBC WITH DIFFERENTIAL/PLATELET
Basophils Absolute: 0 10*3/uL (ref 0.0–0.1)
Basophils Relative: 0.4 % (ref 0.0–3.0)
EOS ABS: 0.1 10*3/uL (ref 0.0–0.7)
EOS PCT: 0.8 % (ref 0.0–5.0)
HCT: 42.2 % (ref 36.0–46.0)
Hemoglobin: 14.5 g/dL (ref 12.0–15.0)
LYMPHS ABS: 2.6 10*3/uL (ref 0.7–4.0)
LYMPHS PCT: 30.7 % (ref 12.0–46.0)
MCHC: 34.3 g/dL (ref 30.0–36.0)
MCV: 95.4 fl (ref 78.0–100.0)
MONO ABS: 0.5 10*3/uL (ref 0.1–1.0)
Monocytes Relative: 6.1 % (ref 3.0–12.0)
Neutro Abs: 5.2 10*3/uL (ref 1.4–7.7)
Neutrophils Relative %: 62 % (ref 43.0–77.0)
Platelets: 388 10*3/uL (ref 150.0–400.0)
RBC: 4.42 Mil/uL (ref 3.87–5.11)
RDW: 13.3 % (ref 11.5–15.5)
WBC: 8.3 10*3/uL (ref 4.0–10.5)

## 2014-05-29 LAB — COMPREHENSIVE METABOLIC PANEL
ALT: 18 U/L (ref 0–35)
AST: 18 U/L (ref 0–37)
Albumin: 3.7 g/dL (ref 3.5–5.2)
Alkaline Phosphatase: 83 U/L (ref 39–117)
BILIRUBIN TOTAL: 0.4 mg/dL (ref 0.2–1.2)
BUN: 11 mg/dL (ref 6–23)
CO2: 28 meq/L (ref 19–32)
Calcium: 9 mg/dL (ref 8.4–10.5)
Chloride: 100 mEq/L (ref 96–112)
Creatinine, Ser: 0.71 mg/dL (ref 0.40–1.20)
GFR: 118.46 mL/min (ref >60.00)
GLUCOSE: 129 mg/dL — AB (ref 70–99)
Potassium: 4 mEq/L (ref 3.5–5.1)
Sodium: 132 mEq/L — ABNORMAL LOW (ref 135–145)
Total Protein: 7.4 g/dL (ref 6.0–8.3)

## 2014-05-29 LAB — HEMOGLOBIN A1C: Hgb A1c MFr Bld: 7.4 % — ABNORMAL HIGH (ref 4.6–6.5)

## 2014-05-29 LAB — MICROALBUMIN / CREATININE URINE RATIO
CREATININE, U: 143.2 mg/dL
MICROALB UR: 0.7 mg/dL (ref 0.0–1.9)
MICROALB/CREAT RATIO: 0.5 mg/g (ref 0.0–30.0)

## 2014-05-29 LAB — TSH: TSH: 1.85 u[IU]/mL (ref 0.35–4.50)

## 2014-05-29 MED ORDER — LISINOPRIL-HYDROCHLOROTHIAZIDE 10-12.5 MG PO TABS: ORAL_TABLET | ORAL | Status: DC

## 2014-05-29 MED ORDER — METFORMIN HCL ER 500 MG PO TB24: ORAL_TABLET | ORAL | Status: DC

## 2014-05-29 NOTE — Addendum Note (Signed)
Addended by: Karlene Einstein D on: 05/29/2014 04:37 PM   Modules accepted: Orders

## 2014-05-29 NOTE — Assessment & Plan Note (Addendum)
BP Readings from Last 3 Encounters:  05/29/14 125/85  03/05/14 133/71  12/03/13 120/76   BP well controlled. Renal function with labs. Continue current medications.

## 2014-05-29 NOTE — Patient Instructions (Signed)

## 2014-05-29 NOTE — Progress Notes (Signed)
Pre visit review using our clinic review tool, if applicable. No additional management support is needed unless otherwise documented below in the visit note. 

## 2014-05-29 NOTE — Assessment & Plan Note (Signed)
Wt Readings from Last 3 Encounters:  05/29/14 292 lb 6 oz (132.62 kg)  03/05/14 288 lb 8 oz (130.863 kg)  12/03/13 295 lb (133.811 kg)   Body mass index is 48.65 kg/(m^2). Encouraged healthy diet and exercise.

## 2014-05-29 NOTE — Progress Notes (Signed)
Subjective:    Patient ID: Veronica Norton, female    DOB: 10-08-1975, 39 y.o.   MRN: [id number]  HPI  39YO female presents for physical exam.  Feeling well. No concerns today.  Trying to work on being more active in effort to lose weight.   Wt Readings from Last 3 Encounters:  05/29/14 292 lb 6 oz (132.62 kg)  03/05/14 288 lb 8 oz (130.863 kg)  12/03/13 295 lb (133.811 kg)   Body mass index is 48.65 kg/(m^2).  Past medical, surgical, family and social history per today's encounter.  Review of Systems  Constitutional: Negative for fever, chills, appetite change, fatigue and unexpected weight change.  Eyes: Negative for visual disturbance.  Respiratory: Negative for shortness of breath.   Cardiovascular: Negative for chest pain and leg swelling.  Gastrointestinal: Negative for nausea, vomiting, abdominal pain, diarrhea and constipation.  Musculoskeletal: Negative for myalgias and arthralgias.  Skin: Negative for color change and rash.  Hematological: Negative for adenopathy. Does not bruise/bleed easily.  Psychiatric/Behavioral: Negative for suicidal ideas, sleep disturbance and dysphoric mood. The patient is not nervous/anxious.        Objective:    BP 125/85 mmHg  Pulse 82  Temp(Src) 97.9 F (36.6 C) (Oral)  Ht 5\' 5"  (1.651 m)  Wt 292 lb 6 oz (132.62 kg)  BMI 48.65 kg/m2  SpO2 98%  LMP 04/27/2014 Physical Exam  Constitutional: She is oriented to person, place, and time. She appears well-developed and well-nourished. No distress.  HENT:  Head: Normocephalic and atraumatic.  Right Ear: External ear normal.  Left Ear: External ear normal.  Nose: Nose normal.  Mouth/Throat: Oropharynx is clear and moist. No oropharyngeal exudate.  Eyes: Conjunctivae are normal. Pupils are equal, round, and reactive to light. Right eye exhibits no discharge. Left eye exhibits no discharge. No scleral icterus.  Neck: Normal range of motion. Neck supple. No tracheal deviation present.  No thyromegaly present.  Cardiovascular: Normal rate, regular rhythm, normal heart sounds and intact distal pulses.  Exam reveals no gallop and no friction rub.   No murmur heard. Pulmonary/Chest: Effort normal and breath sounds normal. No respiratory distress. She has no wheezes. She has no rales. She exhibits no tenderness.  Abdominal: Soft. Bowel sounds are normal. She exhibits no distension and no mass. There is no tenderness. There is no rebound and no guarding.  Genitourinary: Rectum normal, vagina normal and uterus normal. No breast swelling, tenderness, discharge or bleeding. Pelvic exam was performed with patient supine. There is no rash, tenderness or lesion on the right labia. There is no rash, tenderness or lesion on the left labia. Uterus is not enlarged and not tender. Cervix exhibits no motion tenderness, no discharge and no friability. Right adnexum displays no mass, no tenderness and no fullness. Left adnexum displays no mass, no tenderness and no fullness. No erythema or tenderness in the vagina. No vaginal discharge found.  Musculoskeletal: Normal range of motion. She exhibits no edema or tenderness.  Lymphadenopathy:    She has no cervical adenopathy.  Neurological: She is alert and oriented to person, place, and time. No cranial nerve deficit. She exhibits normal muscle tone. Coordination normal.  Skin: Skin is warm and dry. No rash noted. She is not diaphoretic. No erythema. No pallor.  Psychiatric: She has a normal mood and affect. Her behavior is normal. Judgment and thought content normal.          Assessment & Plan:   Problem List Items Addressed This Visit  Unprioritized   Diabetes mellitus type 2 in obese    Will check A1c with labs. Continue metformin. Foot exam normal today.      Relevant Medications   metFORMIN (GLUCOPHAGE-XR) 24 hr tablet   lisinopril-hydrochlorothiazide (PRINZIDE,ZESTORETIC) 10-12.5 MG per tablet   Other Relevant Orders    Comprehensive metabolic panel   Hemoglobin A1c   Lipid panel   Microalbumin / creatinine urine ratio   Hypertension    BP Readings from Last 3 Encounters:  05/29/14 125/85  03/05/14 133/71  12/03/13 120/76   BP well controlled. Renal function with labs. Continue current medications.      Relevant Medications   lisinopril-hydrochlorothiazide (PRINZIDE,ZESTORETIC) 10-12.5 MG per tablet   Obesity (BMI 30-39.9)    Wt Readings from Last 3 Encounters:  05/29/14 292 lb 6 oz (132.62 kg)  03/05/14 288 lb 8 oz (130.863 kg)  12/03/13 295 lb (133.811 kg)   Body mass index is 48.65 kg/(m^2). Encouraged healthy diet and exercise.      Relevant Medications   metFORMIN (GLUCOPHAGE-XR) 24 hr tablet   Other Relevant Orders   TSH   Routine general medical examination at a health care facility - Primary    General medical exam normal including breast and pelvic exam except as noted. PAP is pending. Encouraged better compliance with diet and exercise. Flu Vaccine and Pneumovax UTD. Will check labs including CBC, CMP, lipids, TSH, Vit D.         Relevant Orders   CBC with Differential/Platelet   GC/chlamydia probe amp, urine   HIV antibody (with reflex)       Return in about 3 months (around 08/29/2014) for Recheck of Diabetes.

## 2014-05-29 NOTE — Assessment & Plan Note (Signed)
Will check A1c with labs. Continue metformin. Foot exam normal today.

## 2014-05-29 NOTE — Assessment & Plan Note (Addendum)
General medical exam normal including breast and pelvic exam except as noted. PAP is pending. Encouraged better compliance with diet and exercise. Flu Vaccine and Pneumovax UTD. Will check labs including CBC, CMP, lipids, TSH, Vit D.

## 2014-05-30 LAB — GC/CHLAMYDIA PROBE AMP, URINE
Chlamydia, Swab/Urine, PCR: NEGATIVE
GC Probe Amp, Urine: NEGATIVE

## 2014-05-30 LAB — HIV ANTIBODY (ROUTINE TESTING W REFLEX): HIV: NONREACTIVE

## 2014-06-02 LAB — CYTOLOGY - PAP

## 2014-06-10 ENCOUNTER — Encounter: Payer: Self-pay | Admitting: Internal Medicine

## 2014-06-11 ENCOUNTER — Encounter: Payer: Self-pay | Admitting: Nurse Practitioner

## 2014-06-11 ENCOUNTER — Ambulatory Visit (INDEPENDENT_AMBULATORY_CARE_PROVIDER_SITE_OTHER): Payer: BLUE CROSS/BLUE SHIELD | Admitting: Nurse Practitioner

## 2014-06-11 VITALS — BP 114/82 | HR 90 | Temp 98.1°F | Ht 65.0 in | Wt 292.0 lb

## 2014-06-11 DIAGNOSIS — J069 Acute upper respiratory infection, unspecified: Secondary | ICD-10-CM

## 2014-06-11 MED ORDER — AMOXICILLIN-POT CLAVULANATE 875-125 MG PO TABS: 1.0000 | ORAL_TABLET | Freq: Two times a day (BID) | ORAL | Status: DC

## 2014-06-11 NOTE — Progress Notes (Signed)
Pre visit review using our clinic review tool, if applicable. No additional management support is needed unless otherwise documented below in the visit note. 

## 2014-06-11 NOTE — Progress Notes (Signed)
   Subjective:    Patient ID: Veronica Norton, female    DOB: 09/12/1975, 39 y.o.   MRN: [id number]  HPI  Ms. Veronica Norton is a 39 yo female with a CC of cough x 1 week, nasal congestion, and fever.   1) Chills, subjective fever, throat itching, eyes watery  Yellow nasal drainage  Coughing up yellow/clear mucous  Alkaseltzer cold/flu- helpful  OTC medication for allergies   Review of Systems  Constitutional: Positive for fever, chills and appetite change. Negative for diaphoresis and fatigue.       No appetite.     HENT: Positive for postnasal drip, rhinorrhea and sinus pressure. Negative for ear discharge and ear pain.        Maxillary pressure  Eyes: Negative for visual disturbance.  Respiratory: Positive for cough. Negative for chest tightness, shortness of breath and wheezing.   Cardiovascular: Negative for chest pain, palpitations and leg swelling.  Gastrointestinal: Negative for nausea, vomiting and diarrhea.  Skin: Negative for rash.  Neurological: Negative for dizziness, weakness, numbness and headaches.  Psychiatric/Behavioral: The patient is not nervous/anxious.       Objective:   Physical Exam  Constitutional: She is oriented to person, place, and time. She appears well-developed and well-nourished. No distress.  BP 114/82 mmHg  Pulse 90  Temp(Src) 98.1 F (36.7 C) (Oral)  Ht 5\' 5"  (1.651 m)  Wt 292 lb (132.45 kg)  BMI 48.59 kg/m2  SpO2 97%  LMP 04/27/2014   HENT:  Head: Normocephalic and atraumatic.  Right Ear: External ear normal.  Left Ear: External ear normal.  Mouth/Throat: No oropharyngeal exudate.  TM's clear bilaterally  Eyes: Right eye exhibits no discharge. Left eye exhibits no discharge. No scleral icterus.  Neck: Normal range of motion. Neck supple.  Cardiovascular: Normal rate, regular rhythm, normal heart sounds and intact distal pulses.  Exam reveals no gallop and no friction rub.   No murmur heard. Pulmonary/Chest: Effort normal and breath sounds  normal. No respiratory distress. She has no wheezes. She has no rales. She exhibits no tenderness.  Lymphadenopathy:    She has no cervical adenopathy.  Neurological: She is alert and oriented to person, place, and time. No cranial nerve deficit. She exhibits normal muscle tone. Coordination normal.  Skin: Skin is warm and dry. No rash noted. She is not diaphoretic.  Psychiatric: She has a normal mood and affect. Her behavior is normal. Judgment and thought content normal.          Assessment & Plan:

## 2014-06-11 NOTE — Patient Instructions (Signed)
Please take a probiotic ( Align, Floraque or Culturelle) while you are on the antibiotic to prevent a serious antibiotic associated diarrhea  Called clostirudium dificile colitis and a vaginal yeast infection.   Take antibiotic as instructed.   Allergy medication on top of this.   Cough syrup- delsym is strongest over the counter.

## 2014-06-15 DIAGNOSIS — J069 Acute upper respiratory infection, unspecified: Secondary | ICD-10-CM | POA: Insufficient documentation

## 2014-06-15 NOTE — Assessment & Plan Note (Signed)
Worsening over 7 days. Will try augmentin twice daily for 5 days. OTC allergy medication to continue. Encouraged probiotic with abx. FU prn worsening/failure to improve.

## 2014-08-02 ENCOUNTER — Other Ambulatory Visit: Payer: Self-pay | Admitting: Internal Medicine

## 2014-08-03 NOTE — Telephone Encounter (Signed)
Ok refill? 

## 2014-09-04 ENCOUNTER — Encounter: Payer: Self-pay | Admitting: Internal Medicine

## 2014-09-04 ENCOUNTER — Ambulatory Visit (INDEPENDENT_AMBULATORY_CARE_PROVIDER_SITE_OTHER): Payer: BLUE CROSS/BLUE SHIELD | Admitting: Internal Medicine

## 2014-09-04 VITALS — BP 108/68 | HR 80 | Temp 98.3°F | Ht 65.0 in | Wt 291.2 lb

## 2014-09-04 DIAGNOSIS — I10 Essential (primary) hypertension: Secondary | ICD-10-CM | POA: Diagnosis not present

## 2014-09-04 DIAGNOSIS — E119 Type 2 diabetes mellitus without complications: Secondary | ICD-10-CM

## 2014-09-04 DIAGNOSIS — E1169 Type 2 diabetes mellitus with other specified complication: Secondary | ICD-10-CM

## 2014-09-04 DIAGNOSIS — E669 Obesity, unspecified: Secondary | ICD-10-CM | POA: Diagnosis not present

## 2014-09-04 LAB — COMPREHENSIVE METABOLIC PANEL
ALT: 16 U/L (ref 0–35)
AST: 16 U/L (ref 0–37)
Albumin: 3.4 g/dL — ABNORMAL LOW (ref 3.5–5.2)
Alkaline Phosphatase: 71 U/L (ref 39–117)
BUN: 12 mg/dL (ref 6–23)
CO2: 29 meq/L (ref 19–32)
CREATININE: 0.68 mg/dL (ref 0.40–1.20)
Calcium: 8.7 mg/dL (ref 8.4–10.5)
Chloride: 104 mEq/L (ref 96–112)
GFR: 124.34 mL/min (ref >60.00)
GLUCOSE: 135 mg/dL — AB (ref 70–99)
Potassium: 3.9 mEq/L (ref 3.5–5.1)
SODIUM: 136 meq/L (ref 135–145)
TOTAL PROTEIN: 6.9 g/dL (ref 6.0–8.3)
Total Bilirubin: 0.5 mg/dL (ref 0.2–1.2)

## 2014-09-04 LAB — LIPID PANEL
CHOL/HDL RATIO: 3
Cholesterol: 116 mg/dL (ref 0–200)
HDL: 42.7 mg/dL (ref >39.00)
LDL CALC: 61 mg/dL (ref 0–99)
NonHDL: 73.3
TRIGLYCERIDES: 61 mg/dL (ref 0.0–149.0)
VLDL: 12.2 mg/dL (ref 0.0–40.0)

## 2014-09-04 LAB — HEMOGLOBIN A1C: Hgb A1c MFr Bld: 7.3 % — ABNORMAL HIGH (ref 4.6–6.5)

## 2014-09-04 NOTE — Patient Instructions (Signed)
Labs today.  Consider reading the book "Always Hungry" by Isabella Stalling to learn more about low carbohydrate diet.  Set goal of exercise 23min 3x per week.

## 2014-09-04 NOTE — Assessment & Plan Note (Signed)
Will check A1c with labs today. Continue Metformin. 

## 2014-09-04 NOTE — Progress Notes (Signed)
Pre visit review using our clinic review tool, if applicable. No additional management support is needed unless otherwise documented below in the visit note. 

## 2014-09-04 NOTE — Assessment & Plan Note (Signed)
Wt Readings from Last 3 Encounters:  09/04/14 291 lb 4 oz (132.11 kg)  06/11/14 292 lb (132.45 kg)  05/29/14 292 lb 6 oz (132.62 kg)   Body mass index is 48.47 kg/(m^2). The patient is asked to make an attempt to improve diet and exercise patterns to aid in medical management of this problem.

## 2014-09-04 NOTE — Assessment & Plan Note (Signed)
BP Readings from Last 3 Encounters:  09/04/14 108/68  06/11/14 114/82  05/29/14 125/85   BP well controlled. Renal function with labs. Continue Lisinopril-HCTZ.

## 2014-09-04 NOTE — Progress Notes (Signed)
Subjective:    Patient ID: Veronica Norton, female    DOB: 01-Sep-1975, 39 y.o.   MRN: [id number]  HPI  39YO female presents for follow up.  DM - Does not check blood sugars. Compliant with medications.  Not following any diet or exercise program. Would like to be more active and limit intake of sweetened beverages and bread.  Past medical, surgical, family and social history per today's encounter.  Review of Systems  Constitutional: Negative for fever, chills, appetite change, fatigue and unexpected weight change.  Eyes: Negative for visual disturbance.  Respiratory: Negative for shortness of breath.   Cardiovascular: Negative for chest pain and leg swelling.  Gastrointestinal: Negative for nausea, vomiting, abdominal pain, diarrhea and constipation.  Musculoskeletal: Negative for myalgias and arthralgias.  Skin: Negative for color change and rash.  Hematological: Negative for adenopathy. Does not bruise/bleed easily.  Psychiatric/Behavioral: Negative for sleep disturbance and dysphoric mood. The patient is not nervous/anxious.        Objective:    BP 108/68 mmHg  Pulse 80  Temp(Src) 98.3 F (36.8 C) (Oral)  Ht 5\' 5"  (1.651 m)  Wt 291 lb 4 oz (132.11 kg)  BMI 48.47 kg/m2  SpO2 95%  LMP 08/21/2014 (Approximate) Physical Exam  Constitutional: She is oriented to person, place, and time. She appears well-developed and well-nourished. No distress.  HENT:  Head: Normocephalic and atraumatic.  Right Ear: External ear normal.  Left Ear: External ear normal.  Nose: Nose normal.  Mouth/Throat: Oropharynx is clear and moist. No oropharyngeal exudate.  Eyes: Conjunctivae are normal. Pupils are equal, round, and reactive to light. Right eye exhibits no discharge. Left eye exhibits no discharge. No scleral icterus.  Neck: Normal range of motion. Neck supple. No tracheal deviation present. No thyromegaly present.  Cardiovascular: Normal rate, regular rhythm, normal heart sounds and  intact distal pulses.  Exam reveals no gallop and no friction rub.   No murmur heard. Pulmonary/Chest: Effort normal and breath sounds normal. No respiratory distress. She has no wheezes. She has no rales. She exhibits no tenderness.  Musculoskeletal: Normal range of motion. She exhibits no edema or tenderness.  Lymphadenopathy:    She has no cervical adenopathy.  Neurological: She is alert and oriented to person, place, and time. No cranial nerve deficit. She exhibits normal muscle tone. Coordination normal.  Skin: Skin is warm and dry. No rash noted. She is not diaphoretic. No erythema. No pallor.  Psychiatric: She has a normal mood and affect. Her behavior is normal. Judgment and thought content normal.          Assessment & Plan:   Problem List Items Addressed This Visit      Unprioritized   Diabetes mellitus type 2 in obese - Primary    Will check A1c with labs today. Continue Metformin.      Relevant Orders   Comprehensive metabolic panel   Hemoglobin A1c   Microalbumin / creatinine urine ratio   Lipid Profile   Hypertension    BP Readings from Last 3 Encounters:  09/04/14 108/68  06/11/14 114/82  05/29/14 125/85   BP well controlled. Renal function with labs. Continue Lisinopril-HCTZ.      Severe obesity (BMI >= 40)    Wt Readings from Last 3 Encounters:  09/04/14 291 lb 4 oz (132.11 kg)  06/11/14 292 lb (132.45 kg)  05/29/14 292 lb 6 oz (132.62 kg)   Body mass index is 48.47 kg/(m^2). The patient is asked to make an attempt  to improve diet and exercise patterns to aid in medical management of this problem.           Return in about 3 months (around 12/05/2014) for Recheck of Diabetes.

## 2014-09-09 NOTE — Progress Notes (Signed)
Thanks

## 2014-11-18 ENCOUNTER — Other Ambulatory Visit: Payer: Self-pay | Admitting: Internal Medicine

## 2014-12-07 ENCOUNTER — Other Ambulatory Visit: Payer: Self-pay | Admitting: Internal Medicine

## 2014-12-10 ENCOUNTER — Ambulatory Visit: Payer: BLUE CROSS/BLUE SHIELD | Admitting: Internal Medicine

## 2014-12-17 ENCOUNTER — Encounter: Payer: Self-pay | Admitting: Internal Medicine

## 2014-12-17 ENCOUNTER — Ambulatory Visit (INDEPENDENT_AMBULATORY_CARE_PROVIDER_SITE_OTHER): Payer: BLUE CROSS/BLUE SHIELD | Admitting: Internal Medicine

## 2014-12-17 VITALS — BP 134/79 | HR 80 | Temp 97.9°F | Ht 65.0 in | Wt 298.1 lb

## 2014-12-17 DIAGNOSIS — E119 Type 2 diabetes mellitus without complications: Secondary | ICD-10-CM | POA: Diagnosis not present

## 2014-12-17 DIAGNOSIS — E669 Obesity, unspecified: Secondary | ICD-10-CM | POA: Diagnosis not present

## 2014-12-17 DIAGNOSIS — E1169 Type 2 diabetes mellitus with other specified complication: Secondary | ICD-10-CM

## 2014-12-17 DIAGNOSIS — I10 Essential (primary) hypertension: Secondary | ICD-10-CM | POA: Diagnosis not present

## 2014-12-17 DIAGNOSIS — Z23 Encounter for immunization: Secondary | ICD-10-CM

## 2014-12-17 DIAGNOSIS — R079 Chest pain, unspecified: Secondary | ICD-10-CM | POA: Diagnosis not present

## 2014-12-17 DIAGNOSIS — Z309 Encounter for contraceptive management, unspecified: Secondary | ICD-10-CM

## 2014-12-17 DIAGNOSIS — IMO0001 Reserved for inherently not codable concepts without codable children: Secondary | ICD-10-CM | POA: Insufficient documentation

## 2014-12-17 LAB — COMPREHENSIVE METABOLIC PANEL
ALBUMIN: 3.5 g/dL (ref 3.5–5.2)
ALK PHOS: 75 U/L (ref 39–117)
ALT: 16 U/L (ref 0–35)
AST: 13 U/L (ref 0–37)
BILIRUBIN TOTAL: 0.4 mg/dL (ref 0.2–1.2)
BUN: 12 mg/dL (ref 6–23)
CALCIUM: 8.8 mg/dL (ref 8.4–10.5)
CO2: 26 mEq/L (ref 19–32)
Chloride: 101 mEq/L (ref 96–112)
Creatinine, Ser: 0.75 mg/dL (ref 0.40–1.20)
GFR: 110.88 mL/min (ref >60.00)
GLUCOSE: 160 mg/dL — AB (ref 70–99)
Potassium: 4.3 mEq/L (ref 3.5–5.1)
Sodium: 138 mEq/L (ref 135–145)
TOTAL PROTEIN: 7 g/dL (ref 6.0–8.3)

## 2014-12-17 LAB — MICROALBUMIN / CREATININE URINE RATIO
CREATININE, U: 293.3 mg/dL
MICROALB/CREAT RATIO: 2.6 mg/g (ref 0.0–30.0)
Microalb, Ur: 7.6 mg/dL — ABNORMAL HIGH (ref 0.0–1.9)

## 2014-12-17 LAB — LIPID PANEL
CHOL/HDL RATIO: 3
CHOLESTEROL: 140 mg/dL (ref 0–200)
HDL: 46.1 mg/dL (ref >39.00)
LDL Cholesterol: 75 mg/dL (ref 0–99)
NonHDL: 93.44
TRIGLYCERIDES: 93 mg/dL (ref 0.0–149.0)
VLDL: 18.6 mg/dL (ref 0.0–40.0)

## 2014-12-17 LAB — HEMOGLOBIN A1C: HEMOGLOBIN A1C: 8.4 % — AB (ref 4.6–6.5)

## 2014-12-17 LAB — TSH: TSH: 1.57 u[IU]/mL (ref 0.35–4.50)

## 2014-12-17 MED ORDER — LIRAGLUTIDE -WEIGHT MANAGEMENT 18 MG/3ML ~~LOC~~ SOPN: 0.6000 mg | PEN_INJECTOR | Freq: Every day | SUBCUTANEOUS | Status: DC

## 2014-12-17 NOTE — Assessment & Plan Note (Signed)
Will check A1c with labs. Continue Metformin. 

## 2014-12-17 NOTE — Assessment & Plan Note (Addendum)
One episode of substernal chest pain. Improved with Tums. Likely reflux. EKG today showed non-specific T-wave changes. Will set up cardiology evaluation for possible stress test.

## 2014-12-17 NOTE — Assessment & Plan Note (Signed)
Wt Readings from Last 3 Encounters:  12/17/14 298 lb 2 oz (135.229 kg)  09/04/14 291 lb 4 oz (132.11 kg)  06/11/14 292 lb (132.45 kg)   Body mass index is 49.61 kg/(m^2). Encouraged healthy diet and exercise. Will start Saxenda to help with appetite. Discussed potential risks of this medication. Follow up in 1-2 weeks.

## 2014-12-17 NOTE — Patient Instructions (Addendum)
Look at website [email or web address] to get coupon for weight loss.  Start Saxenda 0.6mg  injected daily.  Follow up 2 weeks.

## 2014-12-17 NOTE — Assessment & Plan Note (Signed)
Discussed goal of coming off combined OCP given HTN and obesity. Encouraged her to consider IUD and GYN evaluation.

## 2014-12-17 NOTE — Assessment & Plan Note (Signed)
BP Readings from Last 3 Encounters:  12/17/14 134/79  09/04/14 108/68  06/11/14 114/82   BP more elevated today. Encouraged her to stop Green Tea Extract which may increase BP. Follow up in 1-2 weeks.

## 2014-12-17 NOTE — Progress Notes (Signed)
Subjective:    Patient ID: Veronica Norton, female    DOB: 04-Aug-1975, 39 y.o.   MRN: [id number]  HPI  39YO female presents for follow up.  DM - Hasn't been checking BG. Taking Metformin.  HTN - Does not check BP. One episode of chest pain, substernal. Attributed to gas. No diaphoresis, nausea, dyspnea. Resolved with taking Tums.  Questions if she should get off her OCP. Questions if she would like to try to get pregnant.  Obesity - Would like to lose weight. Not currently following any diet or exercise program.  Wt Readings from Last 3 Encounters:  12/17/14 298 lb 2 oz (135.229 kg)  09/04/14 291 lb 4 oz (132.11 kg)  06/11/14 292 lb (132.45 kg)   BP Readings from Last 3 Encounters:  12/17/14 134/79  09/04/14 108/68  06/11/14 114/82    Past Medical History  Diagnosis Date  . Diabetes mellitus   . Hypertension   . Normal cardiac stress test 2009   Family History  Problem Relation Age of Onset  . Heart disease Mother     2005 stent placement  . Diabetes Father   . Stroke Maternal Aunt   . Heart disease Maternal Uncle   . Heart disease Maternal Grandmother   . Cancer Maternal Grandfather     prostate  . Heart disease Maternal Grandfather    Past Surgical History  Procedure Laterality Date  . Wisdom tooth extraction     Social History   Social History  . Marital Status: Single    Spouse Name: N/A  . Number of Children: 0  . Years of Education: N/A   Occupational History  . 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 Smoking status: Never Smoker   . Smokeless tobacco: Never Used  . Alcohol Use: Yes     Comment: Occasional  . Drug Use: No  . Sexual Activity: Not Asked   Other Topics Concern  . None   Social History Narrative   Lives in [place]. Works counseling substance abusers.      Regular Exercise -  NO   Daily Caffeine Use:  NO             Review of Systems  Constitutional: Negative for fever, chills, appetite  change, fatigue and unexpected weight change.  Eyes: Negative for visual disturbance.  Respiratory: Negative for shortness of breath.   Cardiovascular: Positive for chest pain. Negative for leg swelling.  Gastrointestinal: Negative for nausea, vomiting, abdominal pain, diarrhea, constipation and blood in stool.  Musculoskeletal: Negative for myalgias and arthralgias.  Skin: Negative for color change and rash.  Hematological: Negative for adenopathy. Does not bruise/bleed easily.  Psychiatric/Behavioral: Negative for sleep disturbance and dysphoric mood. The patient is not nervous/anxious.        Objective:    BP 134/79 mmHg  Pulse 80  Temp(Src) 97.9 F (36.6 C) (Oral)  Ht 5\' 5"  (1.651 m)  Wt 298 lb 2 oz (135.229 kg)  BMI 49.61 kg/m2  SpO2 98%  LMP 12/17/2014 Physical Exam  Constitutional: She is oriented to person, place, and time. She appears well-developed and well-nourished. No distress.  HENT:  Head: Normocephalic and atraumatic.  Right Ear: External ear normal.  Left Ear: External ear normal.  Nose: Nose normal.  Mouth/Throat: Oropharynx is clear and moist. No oropharyngeal exudate.  Eyes: Conjunctivae are normal. Pupils are equal, round, and reactive to light. Right eye exhibits no discharge. Left eye exhibits  no discharge. No scleral icterus.  Neck: Normal range of motion. Neck supple. No tracheal deviation present. No thyromegaly present.  Cardiovascular: Normal rate, regular rhythm, normal heart sounds and intact distal pulses.  Exam reveals no gallop and no friction rub.   No murmur heard. Pulmonary/Chest: Effort normal and breath sounds normal. No respiratory distress. She has no wheezes. She has no rales. She exhibits no tenderness.  Musculoskeletal: Normal range of motion. She exhibits no edema or tenderness.  Lymphadenopathy:    She has no cervical adenopathy.  Neurological: She is alert and oriented to person, place, and time. No cranial nerve deficit. She  exhibits normal muscle tone. Coordination normal.  Skin: Skin is warm and dry. No rash noted. She is not diaphoretic. No erythema. No pallor.  Psychiatric: She has a normal mood and affect. Her behavior is normal. Judgment and thought content normal.          Assessment & Plan:   Problem List Items Addressed This Visit      Unprioritized   Birth control    Discussed goal of coming off combined OCP given HTN and obesity. Encouraged her to consider IUD and GYN evaluation.      Chest pain    One episode of substernal chest pain. Improved with Tums. Likely reflux. EKG today showed non-specific T-wave changes. Will set up cardiology evaluation for possible stress test.      Relevant Orders   EKG 12-Lead (Completed)   Ambulatory referral to Cardiology   Diabetes mellitus type 2 in obese ([place]) - Primary    Will check A1c with labs. Continue Metformin.      Relevant Orders   Comprehensive metabolic panel   Hemoglobin A1c   Lipid panel   Microalbumin / creatinine urine ratio   TSH   Hypertension    BP Readings from Last 3 Encounters:  12/17/14 134/79  09/04/14 108/68  06/11/14 114/82   BP more elevated today. Encouraged her to stop Green Tea Extract which may increase BP. Follow up in 1-2 weeks.      Severe obesity (BMI >= 40) (HCC)    Wt Readings from Last 3 Encounters:  12/17/14 298 lb 2 oz (135.229 kg)  09/04/14 291 lb 4 oz (132.11 kg)  06/11/14 292 lb (132.45 kg)   Body mass index is 49.61 kg/(m^2). Encouraged healthy diet and exercise. Will start Saxenda to help with appetite. Discussed potential risks of this medication. Follow up in 1-2 weeks.      Relevant Medications   Liraglutide -Weight Management (SAXENDA) 18 MG/3ML SOPN    Other Visit Diagnoses    Encounter for immunization            Return in about 2 weeks (around 12/31/2014) for Recheck.

## 2014-12-17 NOTE — Progress Notes (Signed)
Pre visit review using our clinic review tool, if applicable. No additional management support is needed unless otherwise documented below in the visit note. 

## 2014-12-25 ENCOUNTER — Telehealth: Payer: Self-pay

## 2014-12-25 NOTE — Telephone Encounter (Signed)
PA for Saxenda started on Cover my meds.

## 2014-12-30 ENCOUNTER — Encounter: Payer: Self-pay | Admitting: *Deleted

## 2014-12-30 NOTE — Telephone Encounter (Signed)
Received fax from [facility] stating that prior authorization has been denied.  Notified pt, advised pt to look for coupon from mfg online.

## 2015-01-20 ENCOUNTER — Encounter: Payer: Self-pay | Admitting: Internal Medicine

## 2015-01-21 ENCOUNTER — Encounter: Payer: Self-pay | Admitting: Family Medicine

## 2015-01-21 ENCOUNTER — Ambulatory Visit (INDEPENDENT_AMBULATORY_CARE_PROVIDER_SITE_OTHER): Payer: BLUE CROSS/BLUE SHIELD | Admitting: Family Medicine

## 2015-01-21 VITALS — BP 112/64 | HR 94 | Temp 98.3°F | Ht 65.0 in | Wt 295.2 lb

## 2015-01-21 DIAGNOSIS — H6982 Other specified disorders of Eustachian tube, left ear: Secondary | ICD-10-CM

## 2015-01-21 DIAGNOSIS — H6980 Other specified disorders of Eustachian tube, unspecified ear: Secondary | ICD-10-CM | POA: Insufficient documentation

## 2015-01-21 MED ORDER — AMOXICILLIN 500 MG PO CAPS: 500.0000 mg | ORAL_CAPSULE | Freq: Two times a day (BID) | ORAL | Status: DC

## 2015-01-21 NOTE — Assessment & Plan Note (Signed)
Advised short-term use of Sudafed given known hypertension. Additionally advised may pot and Flonase. Given significant erythema the left ear on exam, prescription for amoxicillin was given in case she fails to improve as this may likely develop into otitis media.

## 2015-01-21 NOTE — Progress Notes (Signed)
Pre visit review using our clinic review tool, if applicable. No additional management support is needed unless otherwise documented below in the visit note. 

## 2015-01-21 NOTE — Progress Notes (Signed)
Subjective:  Patient ID: Veronica Norton, female    DOB: Dec 10, 1975  Age: 39 y.o. MRN: [id number]  CC: Ear congestion  HPI:  39 year old female presents to clinic today for an acute visit with complaints of left ear congestion.  Patient reports that she has had ear congestion and associated decreased hearing of her left ear since Monday. She states that this was preceded by sinus pressure/congestion. She has taken Mucinex with relief in those symptoms but the ear congestion has continued. No associated fevers or chills. No other complaints at this time. No exacerbating or relieving factors.  Social Hx   Social History   Social History  . Marital Status: Single    Spouse Name: N/A  . Number of Children: 0  . Years of Education: N/A   Occupational History  . 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 Smoking status: Never Smoker   . Smokeless tobacco: Never Used  . Alcohol Use: Yes     Comment: Occasional  . Drug Use: No  . Sexual Activity: Not Asked   Other Topics Concern  . None   Social History Narrative   Lives in [place]. Works counseling substance abusers.      Regular Exercise -  NO   Daily Caffeine Use:  NO            Review of Systems  Constitutional: Negative for fever and chills.  HENT: Positive for sinus pressure.        Ear congestion.   Objective:  BP 112/64 mmHg  Pulse 94  Temp(Src) 98.3 F (36.8 C) (Oral)  Ht 5\' 5"  (1.651 m)  Wt 295 lb 4 oz (133.925 kg)  BMI 49.13 kg/m2  SpO2 95%  BP/Weight 01/21/2015 12/17/2014 [id number]  Systolic BP [id number]  Diastolic BP 64 79 68  Wt. (Lbs) 295.25 298.13 291.25  BMI 49.13 49.61 48.47   Physical Exam  Constitutional: She appears well-developed. No distress.  HENT:  Head: Normocephalic and atraumatic.  Right Ear: External ear normal.  Left Ear: External ear normal.  Mouth/Throat: Oropharynx is clear and moist.  Right TM normal. Left TM with erythema.  Neck: Neck supple.    Cardiovascular: Normal rate and regular rhythm.   Pulmonary/Chest: Effort normal and breath sounds normal.  Lymphadenopathy:    She has no cervical adenopathy.  Neurological: She is alert.  Psychiatric: She has a normal mood and affect.  Vitals reviewed.  Lab Results  Component Value Date   WBC 8.3 05/29/2014   HGB 14.5 05/29/2014   HCT 42.2 05/29/2014   PLT 388.0 05/29/2014   GLUCOSE 160* 12/17/2014   CHOL 140 12/17/2014   TRIG 93.0 12/17/2014   HDL 46.10 12/17/2014   LDLCALC 75 12/17/2014   ALT 16 12/17/2014   AST 13 12/17/2014   NA 138 12/17/2014   K 4.3 12/17/2014   CL 101 12/17/2014   CREATININE 0.75 12/17/2014   BUN 12 12/17/2014   CO2 26 12/17/2014   TSH 1.57 12/17/2014   HGBA1C 8.4* 12/17/2014   MICROALBUR 7.6* 12/17/2014   Assessment & Plan:   Problem List Items Addressed This Visit    Eustachian tube dysfunction - Primary    Advised short-term use of Sudafed given known hypertension. Additionally advised may pot and Flonase. Given significant erythema the left ear on exam, prescription for amoxicillin was given in case she fails to improve as this may likely develop into otitis media.  Meds ordered this encounter  Medications  . amoxicillin (AMOXIL) 500 MG capsule    Sig: Take 1 capsule (500 mg total) by mouth 2 (two) times daily.    Dispense:  14 capsule    Refill:  0    Follow-up: PRN  Thersa Salt, DO

## 2015-01-21 NOTE — Patient Instructions (Signed)
You can use some OTC Sudafed to help with the eustacian tube dysfunction (use it just for a few days given the fact that you have high blood pressure).  If you fail to improve you can fill the antibiotic.  Nettie pot and over-the-counter Flonase may also help.  Follow up as needed.  Take care  Dr. Lacinda Axon

## 2015-02-02 ENCOUNTER — Ambulatory Visit (INDEPENDENT_AMBULATORY_CARE_PROVIDER_SITE_OTHER): Payer: BLUE CROSS/BLUE SHIELD

## 2015-02-02 ENCOUNTER — Ambulatory Visit (INDEPENDENT_AMBULATORY_CARE_PROVIDER_SITE_OTHER): Payer: BLUE CROSS/BLUE SHIELD | Admitting: Cardiovascular Disease

## 2015-02-02 ENCOUNTER — Encounter: Payer: Self-pay | Admitting: Cardiovascular Disease

## 2015-02-02 VITALS — BP 128/72 | HR 75 | Ht 65.0 in | Wt 293.0 lb

## 2015-02-02 DIAGNOSIS — R079 Chest pain, unspecified: Secondary | ICD-10-CM | POA: Diagnosis not present

## 2015-02-02 DIAGNOSIS — R9431 Abnormal electrocardiogram [ECG] [EKG]: Secondary | ICD-10-CM

## 2015-02-02 DIAGNOSIS — I10 Essential (primary) hypertension: Secondary | ICD-10-CM | POA: Diagnosis not present

## 2015-02-02 DIAGNOSIS — E669 Obesity, unspecified: Secondary | ICD-10-CM

## 2015-02-02 DIAGNOSIS — E119 Type 2 diabetes mellitus without complications: Secondary | ICD-10-CM | POA: Diagnosis not present

## 2015-02-02 DIAGNOSIS — E1169 Type 2 diabetes mellitus with other specified complication: Secondary | ICD-10-CM

## 2015-02-02 LAB — EXERCISE TOLERANCE TEST
CHL CUP MPHR: 182 {beats}/min
CHL CUP RESTING HR STRESS: 90 {beats}/min
CSEPED: 6 min
CSEPHR: 87 %
Estimated workload: 7.8 METS
Exercise duration (sec): 34 s
Peak HR: 160 {beats}/min

## 2015-02-02 NOTE — Assessment & Plan Note (Signed)
Atypical in nature No further episodes over the past month. Typically very active at baseline Abnormal EKG does raise concern of ischemia. Stress test has been ordered

## 2015-02-02 NOTE — Patient Instructions (Signed)
You are doing well. No medication changes were made.  We will schedulea treadmill study for abnormal EKG  Please call us if you have new issues that need to be addressed before your next appt.

## 2015-02-02 NOTE — Assessment & Plan Note (Signed)
T wave abnormality noted on EKG with primary care and again today Active, no active anginal symptoms, 1 prior episode of chest discomfort that was atypical in nature Treadmill study ordered today to rule out ischemia She does have risk factors including diabetes, family history Cholesterol is well-controlled on no medication, no smoking history

## 2015-02-02 NOTE — Assessment & Plan Note (Signed)
We have encouraged continued exercise, careful diet management in an effort to lose weight. 

## 2015-02-02 NOTE — Assessment & Plan Note (Signed)
Blood pressure is well controlled on today's visit. No changes made to the medications. 

## 2015-02-02 NOTE — Progress Notes (Signed)
Patient ID: Veronica Norton, female    DOB: [birth year], 39 y.o.   MRN: [id number]  HPI Comments: Veronica Norton is a pleasant 39 year old woman with history of morbid obesity, diabetes type 2 since 2007, who presents with recent episode of chest discomfort and abnormal EKG.  She reports that over one month ago, she had some chest discomfort, She feels it was probably secondary to GI etiology, relieved with belching and Tums.  Recently has been very active.  " does not know how to say no" Tries to do everything for everybody, works a full-time job and a part-time job. Denies any significant shortness of breath or chest pain with exertion.  Review of her lab work shows total cholesterol typically 120 up to 140 over the past several years. Hemoglobin A1c typically in the 7 range, recently 8.4. Diet has not been as good. Having a difficult time losing weight.  She does report a significant family history, mother with coronary artery disease, stent placed. She was in her 38s  EKG on today's visit shows normal sinus rhythm with rate 75 bpm, diffuse T-wave abnormality through the anterior precordial leads, inferior leads Our EKG done by Dr. Gilford Rile again shows T-wave abnormality anterolateral leads, inferior leads  She did bring her running shoes today in expectation of a stress test   No Known Allergies  Current Outpatient Prescriptions on File Prior to Visit  Medication Sig Dispense Refill  . CAMILA 0.35 MG tablet TAKE 1 TABLET BY MOUTH EVERY DAY 28 tablet 6  . fluticasone (CUTIVATE) 0.05 % cream APPLY TO AFFECTED AREA UP TO TWICE A DAY AS NEEDED FOR RASH/ITCHING 60 g 0  . glucose blood (FREESTYLE TEST STRIPS) test strip Check sugar bid 100 each 5  . glucose monitoring kit (FREESTYLE) monitoring kit Use as directed to check sugars bid 1 each 0  . Lancets (ONETOUCH ULTRASOFT) lancets Use as instructed 100 each 5  . Liraglutide -Weight Management (SAXENDA) 18 MG/3ML SOPN Inject 0.6 mg into the  skin daily. 3 mL 3  . lisinopril-hydrochlorothiazide (PRINZIDE,ZESTORETIC) 10-12.5 MG per tablet TAKE 1 TABLET BY MOUTH EVERY DAY 90 tablet 3  . metFORMIN (GLUCOPHAGE-XR) 500 MG 24 hr tablet TAKE 2 TABLETS BY MOUTH TWICE A DAY 120 tablet 11   No current facility-administered medications on file prior to visit.    Past Medical History  Diagnosis Date  . Diabetes mellitus   . Hypertension   . Normal cardiac stress test 2009    Past Surgical History  Procedure Laterality Date  . Wisdom tooth extraction      Social History  reports that she has never smoked. She has never used smokeless tobacco. She reports that she drinks alcohol. She reports that she does not use illicit drugs.  Family History family history includes Cancer in her maternal grandfather; Diabetes in her father; Heart disease in her maternal grandfather, maternal grandmother, maternal uncle, and mother; Stroke in her maternal aunt.   Review of Systems  Constitutional: Negative.   HENT: Negative.   Respiratory: Negative.   Cardiovascular: Negative.   Gastrointestinal: Negative.   Musculoskeletal: Negative.   Neurological: Negative.   Hematological: Negative.   Psychiatric/Behavioral: Negative.   All other systems reviewed and are negative.   BP 128/72 mmHg  Pulse 75  Ht _0  (1.651 m)  Wt 293 lb (132.904 kg)  BMI 48.76 kg/m2  Physical Exam  Constitutional: She is oriented to person, place, and time. She appears well-developed and well-nourished.  Morbidly obese  HENT:  Head: Normocephalic.  Nose: Nose normal.  Mouth/Throat: Oropharynx is clear and moist.  Eyes: Conjunctivae are normal. Pupils are equal, round, and reactive to light.  Neck: Normal range of motion. Neck supple. No JVD present.  Cardiovascular: Normal rate, regular rhythm, normal heart sounds and intact distal pulses.  Exam reveals no gallop and no friction rub.   No murmur heard. Pulmonary/Chest: Effort normal and breath sounds  normal. No respiratory distress. She has no wheezes. She has no rales. She exhibits no tenderness.  Abdominal: Soft. Bowel sounds are normal. She exhibits no distension. There is no tenderness.  Musculoskeletal: Normal range of motion. She exhibits no edema or tenderness.  Lymphadenopathy:    She has no cervical adenopathy.  Neurological: She is alert and oriented to person, place, and time. Coordination normal.  Skin: Skin is warm and dry. No rash noted. No erythema.  Psychiatric: She has a normal mood and affect. Her behavior is normal. Judgment and thought content normal.

## 2015-02-02 NOTE — Assessment & Plan Note (Signed)
Long discussion concerning her weight. She is trying to get insurance clearance for a weight loss medication Does not want surgical intervention at this time Dietary guide provided

## 2015-03-31 ENCOUNTER — Other Ambulatory Visit: Payer: Self-pay | Admitting: Internal Medicine

## 2015-06-03 ENCOUNTER — Other Ambulatory Visit: Payer: Self-pay | Admitting: Internal Medicine

## 2015-06-05 ENCOUNTER — Other Ambulatory Visit: Payer: Self-pay | Admitting: Internal Medicine

## 2015-09-09 ENCOUNTER — Encounter: Payer: Self-pay | Admitting: Internal Medicine

## 2015-09-09 ENCOUNTER — Ambulatory Visit (INDEPENDENT_AMBULATORY_CARE_PROVIDER_SITE_OTHER): Payer: 59 | Admitting: Internal Medicine

## 2015-09-09 VITALS — BP 126/80 | HR 71 | Ht 65.0 in | Wt 287.4 lb

## 2015-09-09 DIAGNOSIS — E1169 Type 2 diabetes mellitus with other specified complication: Secondary | ICD-10-CM

## 2015-09-09 DIAGNOSIS — E669 Obesity, unspecified: Secondary | ICD-10-CM

## 2015-09-09 DIAGNOSIS — E119 Type 2 diabetes mellitus without complications: Secondary | ICD-10-CM

## 2015-09-09 DIAGNOSIS — I10 Essential (primary) hypertension: Secondary | ICD-10-CM | POA: Diagnosis not present

## 2015-09-09 LAB — COMPREHENSIVE METABOLIC PANEL
ALK PHOS: 78 U/L (ref 39–117)
ALT: 17 U/L (ref 0–35)
AST: 16 U/L (ref 0–37)
Albumin: 3.7 g/dL (ref 3.5–5.2)
BUN: 14 mg/dL (ref 6–23)
CO2: 28 mEq/L (ref 19–32)
Calcium: 9 mg/dL (ref 8.4–10.5)
Chloride: 100 mEq/L (ref 96–112)
Creatinine, Ser: 0.69 mg/dL (ref 0.40–1.20)
GFR: 121.61 mL/min (ref >60.00)
GLUCOSE: 152 mg/dL — AB (ref 70–99)
POTASSIUM: 4.3 meq/L (ref 3.5–5.1)
SODIUM: 135 meq/L (ref 135–145)
TOTAL PROTEIN: 7.3 g/dL (ref 6.0–8.3)
Total Bilirubin: 0.4 mg/dL (ref 0.2–1.2)

## 2015-09-09 LAB — LIPID PANEL
CHOL/HDL RATIO: 3
Cholesterol: 146 mg/dL (ref 0–200)
HDL: 46.2 mg/dL (ref >39.00)
LDL CALC: 86 mg/dL (ref 0–99)
NONHDL: 100.14
Triglycerides: 71 mg/dL (ref 0.0–149.0)
VLDL: 14.2 mg/dL (ref 0.0–40.0)

## 2015-09-09 LAB — MICROALBUMIN / CREATININE URINE RATIO
Creatinine,U: 164.2 mg/dL
MICROALB/CREAT RATIO: 0.4 mg/g (ref 0.0–30.0)
Microalb, Ur: <0.7 mg/dL (ref 0.0–1.9)

## 2015-09-09 LAB — HEMOGLOBIN A1C: Hgb A1c MFr Bld: 7.9 % — ABNORMAL HIGH (ref 4.6–6.5)

## 2015-09-09 NOTE — Progress Notes (Signed)
Subjective:    Patient ID: Veronica Norton, female    DOB: 1975-03-14, 40 y.o.   MRN: [id number]  HPI  40YO female presents for follow up.  Last seen in 12/2014 for DM.  DM - Has not been checking blood sugar. Not taking Saxenda. Compliant with metformin.   Obesity - Concerned about weight. Did not use Saxenda because of cost. Not following any particular diet plan.  In a relationship with a new partner, and considering having children. Would like to establish care with an OB.  Wt Readings from Last 3 Encounters:  09/09/15 287 lb 6.4 oz (130.364 kg)  02/02/15 293 lb (132.904 kg)  01/21/15 295 lb 4 oz (133.925 kg)   BP Readings from Last 3 Encounters:  09/09/15 126/80  02/02/15 128/72  01/21/15 112/64    Past Medical History  Diagnosis Date  . Diabetes mellitus   . Hypertension   . Normal cardiac stress test 2009   Family History  Problem Relation Age of Onset  . Heart disease Mother     2005 stent placement  . Diabetes Father   . Stroke Maternal Aunt   . Heart disease Maternal Uncle   . Heart disease Maternal Grandmother   . Cancer Maternal Grandfather     prostate  . Heart disease Maternal Grandfather    Past Surgical History  Procedure Laterality Date  . Wisdom tooth extraction     Social History   Social History  . Marital Status: Single    Spouse Name: N/A  . Number of Children: 0  . Years of Education: N/A   Occupational History  . 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 Smoking status: Never Smoker   . Smokeless tobacco: Never Used  . Alcohol Use: Yes     Comment: Occasional  . Drug Use: No  . Sexual Activity: Not Asked   Other Topics Concern  . None   Social History Narrative   Lives in [place]. Works counseling substance abusers.      Regular Exercise -  NO   Daily Caffeine Use:  NO             Review of Systems  Constitutional: Negative for fever, chills, appetite change, fatigue and unexpected  weight change.  Eyes: Negative for visual disturbance.  Respiratory: Negative for shortness of breath.   Cardiovascular: Negative for chest pain and leg swelling.  Gastrointestinal: Negative for nausea, vomiting, abdominal pain, diarrhea and constipation.  Musculoskeletal: Negative for myalgias and arthralgias.  Skin: Negative for color change and rash.  Hematological: Negative for adenopathy. Does not bruise/bleed easily.  Psychiatric/Behavioral: Negative for sleep disturbance and dysphoric mood. The patient is not nervous/anxious.        Objective:    BP 126/80 mmHg  Pulse 71  Ht 5\' 5"  (1.651 m)  Wt 287 lb 6.4 oz (130.364 kg)  BMI 47.83 kg/m2  SpO2 97%  LMP 09/04/2015 Physical Exam  Constitutional: She is oriented to person, place, and time. She appears well-developed and well-nourished. No distress.  HENT:  Head: Normocephalic and atraumatic.  Right Ear: External ear normal.  Left Ear: External ear normal.  Nose: Nose normal.  Mouth/Throat: Oropharynx is clear and moist. No oropharyngeal exudate.  Eyes: Conjunctivae are normal. Pupils are equal, round, and reactive to light. Right eye exhibits no discharge. Left eye exhibits no discharge. No scleral icterus.  Neck: Normal range of motion. Neck supple. No tracheal deviation present.  No thyromegaly present.  Cardiovascular: Normal rate, regular rhythm, normal heart sounds and intact distal pulses.  Exam reveals no gallop and no friction rub.   No murmur heard. Pulmonary/Chest: Effort normal and breath sounds normal. No respiratory distress. She has no wheezes. She has no rales. She exhibits no tenderness.  Musculoskeletal: Normal range of motion. She exhibits no edema or tenderness.  Lymphadenopathy:    She has no cervical adenopathy.  Neurological: She is alert and oriented to person, place, and time. No cranial nerve deficit. She exhibits normal muscle tone. Coordination normal.  Skin: Skin is warm and dry. No rash noted. She  is not diaphoretic. No erythema. No pallor.  Psychiatric: She has a normal mood and affect. Her behavior is normal. Judgment and thought content normal.          Assessment & Plan:   Problem List Items Addressed This Visit      Unprioritized   Diabetes mellitus type 2 in obese ([place]) - Primary    Not checking BG. Previous A1c was elevated in 2016. Will repeat A1c with labs today. Discussed potentially adding Victoza. Follow up in 4 weeks and prn.      Relevant Orders   Comprehensive metabolic panel   Hemoglobin A1c   Lipid panel   Microalbumin / creatinine urine ratio   Hypertension    BP Readings from Last 3 Encounters:  09/09/15 126/80  02/02/15 128/72  01/21/15 112/64   BP well controlled. Renal function with labs. Continue lisinopril-HCTZ.      Severe obesity (BMI >= 40) (HCC)    Wt Readings from Last 3 Encounters:  09/09/15 287 lb 6.4 oz (130.364 kg)  02/02/15 293 lb (132.904 kg)  01/21/15 295 lb 4 oz (133.925 kg)   Encouraged efforts at healthy diet and exercise. Discussed possibly adding Victoza to help with blood sugars and weight loss. Follow up in 4 weeks after labs complete.          Return in about 4 weeks (around 10/07/2015) for New Patient.  Ronette Deter, MD Internal Medicine [facility]

## 2015-09-09 NOTE — Progress Notes (Signed)
Pre visit review using our clinic review tool, if applicable. No additional management support is needed unless otherwise documented below in the visit note. 

## 2015-09-09 NOTE — Assessment & Plan Note (Signed)
Wt Readings from Last 3 Encounters:  09/09/15 287 lb 6.4 oz (130.364 kg)  02/02/15 293 lb (132.904 kg)  01/21/15 295 lb 4 oz (133.925 kg)   Encouraged efforts at healthy diet and exercise. Discussed possibly adding Victoza to help with blood sugars and weight loss. Follow up in 4 weeks after labs complete.

## 2015-09-09 NOTE — Assessment & Plan Note (Signed)
Not checking BG. Previous A1c was elevated in 2016. Will repeat A1c with labs today. Discussed potentially adding Victoza. Follow up in 4 weeks and prn.

## 2015-09-09 NOTE — Patient Instructions (Addendum)
Labs today.  Consider seeing Dr. Garwin Brothers at [facility].  Follow up in 4 weeks.

## 2015-09-09 NOTE — Assessment & Plan Note (Signed)
BP Readings from Last 3 Encounters:  09/09/15 126/80  02/02/15 128/72  01/21/15 112/64   BP well controlled. Renal function with labs. Continue lisinopril-HCTZ.

## 2015-09-10 MED ORDER — LIRAGLUTIDE 18 MG/3ML ~~LOC~~ SOPN: 0.6000 mg | PEN_INJECTOR | Freq: Every day | SUBCUTANEOUS | Status: DC

## 2015-09-10 MED ORDER — INSULIN PEN NEEDLE 31G X 8 MM MISC: 1.0000 "application " | Freq: Every day | Status: DC

## 2015-09-10 NOTE — Addendum Note (Signed)
Addended by: Elpidio Galea T on: 09/10/2015 04:50 PM   Modules accepted: Orders

## 2015-10-07 ENCOUNTER — Ambulatory Visit (INDEPENDENT_AMBULATORY_CARE_PROVIDER_SITE_OTHER): Payer: 59 | Admitting: Family Medicine

## 2015-10-07 ENCOUNTER — Encounter: Payer: Self-pay | Admitting: Family Medicine

## 2015-10-07 DIAGNOSIS — E119 Type 2 diabetes mellitus without complications: Secondary | ICD-10-CM | POA: Diagnosis not present

## 2015-10-07 DIAGNOSIS — I10 Essential (primary) hypertension: Secondary | ICD-10-CM

## 2015-10-07 DIAGNOSIS — E1169 Type 2 diabetes mellitus with other specified complication: Secondary | ICD-10-CM

## 2015-10-07 DIAGNOSIS — E669 Obesity, unspecified: Secondary | ICD-10-CM

## 2015-10-07 NOTE — Assessment & Plan Note (Signed)
Encouraged continued diet changes. Encouraged exercise. We'll see if she loses any weight on the Victoza as well. Follow-up in 2 months.

## 2015-10-07 NOTE — Progress Notes (Signed)
  Tommi Rumps, MD Phone: [phone]  Veronica Norton is a 40 y.o. female who presents today for follow-up.  HYPERTENSION  Disease Monitoring  Home BP Monitoring not checking Chest pain- no    Dyspnea- no Medications  Compliance-  Taking zestoretic.   Edema- no  DIABETES Disease Monitoring: Blood Sugar ranges-not checking Polyuria/phagia/dipsia- no      Saw optho earlier this year Medications: Compliance- taking metformin and victoza Hypoglycemic symptoms- minimal symptoms at the start of Victoza though none in the last several weeks  Obesity: Has started work on her diet. Is watching what she eats more. More fruits. Has cut back on Posta. No exercise.  PMH: nonsmoker.   ROS see history of present illness  Objective  Physical Exam Vitals:   10/07/15 1533  BP: 114/76  Pulse: 91  Temp: 97.6 F (36.4 C)    BP Readings from Last 3 Encounters:  10/07/15 114/76  09/09/15 126/80  02/02/15 128/72   Wt Readings from Last 3 Encounters:  10/07/15 285 lb 3.2 oz (129.4 kg)  09/09/15 287 lb 6.4 oz (130.4 kg)  02/02/15 293 lb (132.9 kg)    Physical Exam  Constitutional: No distress.  HENT:  Head: Normocephalic and atraumatic.  Cardiovascular: Normal rate, regular rhythm and normal heart sounds.   Pulmonary/Chest: Effort normal and breath sounds normal.  Neurological: She is alert. Gait normal.  Skin: Skin is warm and dry. She is not diaphoretic.     Assessment/Plan: Please see individual problem list.  Diabetes mellitus type 2 in obese Tolerating medication. Encouraged her to check her blood sugars. Continue Victoza metformin.  Hypertension At goal. Continue Zestoretic.  Severe obesity (BMI >= 40) Encouraged continued diet changes. Encouraged exercise. We'll see if she loses any weight on the Victoza as well. Follow-up in 2 months.  Tommi Rumps, MD [place]

## 2015-10-07 NOTE — Progress Notes (Signed)
Pre visit review using our clinic review tool, if applicable. No additional management support is needed unless otherwise documented below in the visit note. 

## 2015-10-07 NOTE — Patient Instructions (Signed)
Nice to meet you. Please continue your current medications. Please continue to work on diet and exercise. I'll see back in 2 months.

## 2015-10-07 NOTE — Assessment & Plan Note (Signed)
At goal. Continue Zestoretic.

## 2015-10-07 NOTE — Assessment & Plan Note (Signed)
Tolerating medication. Encouraged her to check her blood sugars. Continue Victoza metformin.

## 2015-10-13 ENCOUNTER — Other Ambulatory Visit: Payer: Self-pay | Admitting: Internal Medicine

## 2015-10-13 DIAGNOSIS — E669 Obesity, unspecified: Principal | ICD-10-CM

## 2015-10-13 DIAGNOSIS — E1169 Type 2 diabetes mellitus with other specified complication: Secondary | ICD-10-CM

## 2015-10-13 DIAGNOSIS — I10 Essential (primary) hypertension: Secondary | ICD-10-CM

## 2015-12-09 ENCOUNTER — Ambulatory Visit: Payer: 59 | Admitting: Family Medicine

## 2015-12-16 ENCOUNTER — Other Ambulatory Visit: Payer: Self-pay

## 2015-12-16 MED ORDER — NORETHINDRONE 0.35 MG PO TABS: 1.0000 | ORAL_TABLET | Freq: Every day | ORAL | 6 refills | Status: DC

## 2015-12-20 ENCOUNTER — Telehealth: Payer: Self-pay | Admitting: Surgical

## 2015-12-20 DIAGNOSIS — E669 Obesity, unspecified: Principal | ICD-10-CM

## 2015-12-20 DIAGNOSIS — I10 Essential (primary) hypertension: Secondary | ICD-10-CM

## 2015-12-20 DIAGNOSIS — E1169 Type 2 diabetes mellitus with other specified complication: Secondary | ICD-10-CM

## 2015-12-20 NOTE — Telephone Encounter (Signed)
RX refill request for Victoza. Patient last seen 10/07/15. She canceled follow up appointment for 12/09/15. Rescheduled to come back in 01/2016. Please advise on refill

## 2015-12-21 MED ORDER — LIRAGLUTIDE 18 MG/3ML ~~LOC~~ SOPN: 0.6000 mg | PEN_INJECTOR | Freq: Every day | SUBCUTANEOUS | 2 refills | Status: DC

## 2015-12-21 NOTE — Telephone Encounter (Signed)
Refill sent to pharmacy.   

## 2015-12-21 NOTE — Addendum Note (Signed)
Addended by: Leone Haven on: 12/21/2015 09:47 AM   Modules accepted: Orders

## 2016-01-14 ENCOUNTER — Other Ambulatory Visit: Payer: Self-pay | Admitting: Surgical

## 2016-01-14 MED ORDER — LISINOPRIL-HYDROCHLOROTHIAZIDE 10-12.5 MG PO TABS: 1.0000 | ORAL_TABLET | Freq: Every day | ORAL | 2 refills | Status: DC

## 2016-01-31 ENCOUNTER — Encounter: Payer: Self-pay | Admitting: Family Medicine

## 2016-01-31 ENCOUNTER — Ambulatory Visit (INDEPENDENT_AMBULATORY_CARE_PROVIDER_SITE_OTHER): Payer: 59 | Admitting: Family Medicine

## 2016-01-31 VITALS — BP 124/82 | HR 86 | Temp 98.6°F | Wt 289.2 lb

## 2016-01-31 DIAGNOSIS — Z23 Encounter for immunization: Secondary | ICD-10-CM

## 2016-01-31 DIAGNOSIS — I10 Essential (primary) hypertension: Secondary | ICD-10-CM

## 2016-01-31 DIAGNOSIS — E1169 Type 2 diabetes mellitus with other specified complication: Secondary | ICD-10-CM

## 2016-01-31 DIAGNOSIS — E119 Type 2 diabetes mellitus without complications: Secondary | ICD-10-CM

## 2016-01-31 DIAGNOSIS — R52 Pain, unspecified: Secondary | ICD-10-CM

## 2016-01-31 DIAGNOSIS — E669 Obesity, unspecified: Secondary | ICD-10-CM

## 2016-01-31 NOTE — Progress Notes (Signed)
Pre visit review using our clinic review tool, if applicable. No additional management support is needed unless otherwise documented below in the visit note. 

## 2016-01-31 NOTE — Patient Instructions (Signed)
Nice to see you. We'll check some lab work today and call with the results. Please work on [employer] as we discussed. Please start exercising as well. You can start out at 2-3 days a week and work her way up.  Diet Recommendations  Starchy (carb) foods: Bread, rice, pasta, potatoes, corn, cereal, grits, crackers, bagels, muffins, all baked goods.  (Fruits, milk, and yogurt also have carbohydrate, but most of these foods will not spike your blood sugar as the starchy foods will.)  A few fruits do cause high blood sugars; use small portions of bananas (limit to 1/2 at a time), grapes, watermelon, oranges, and most tropical fruits.    Protein foods: Meat, fish, poultry, eggs, dairy foods, and beans such as pinto and kidney beans (beans also provide carbohydrate).   1. Eat at least 3 meals and 1-2 snacks per day. Never go more than 4-5 hours while awake without eating. Eat breakfast within the first hour of getting up.   2. Limit starchy foods to TWO per meal and ONE per snack. ONE portion of a starchy  food is equal to the following:   - ONE slice of bread (or its equivalent, such as half of a hamburger bun).   - 1/2 cup of a "scoopable" starchy food such as potatoes or rice.   - 15 grams of carbohydrate as shown on food label.  3. Include at every meal: a protein food, a carb food, and vegetables and/or fruit.   - Obtain twice the volume of veg's as protein or carbohydrate foods for both lunch and dinner.   - Fresh or frozen veg's are best.   - Keep frozen veg's on hand for a quick vegetable serving.

## 2016-01-31 NOTE — Assessment & Plan Note (Signed)
Patient describes neuropathic burning and tingling discomfort in her anterior thigh. She has no abnormalities on exam. No back pain noted with this. No numbness or weakness. Could be nerve impingement from her back or neuropathy. Discussed options for evaluation and management including imaging, physical therapy, exercises at home, and anti-inflammatory. She has been using Aleve and opted to continue this and monitor. If not improving would favor imaging. She is given return precautions.

## 2016-01-31 NOTE — Assessment & Plan Note (Signed)
A1c today. Continue Victoza and metformin.

## 2016-01-31 NOTE — Assessment & Plan Note (Signed)
At goal. Continue current medications. Check CMP. 

## 2016-01-31 NOTE — Assessment & Plan Note (Signed)
Discussed diet and exercise at length. She'll start exercising 2-3 days a week. Given diet information.

## 2016-01-31 NOTE — Progress Notes (Signed)
  Tommi Rumps, MD Phone: [phone]  Veronica Norton is a 40 y.o. female who presents today for follow-up.  HYPERTENSION  Disease Monitoring  Home BP Monitoring not checking Chest pain- no    Dyspnea- no Medications  Compliance-  taking Zestoretic.  Edema- no  DIABETES Disease Monitoring: Blood Sugar ranges-not checking Polyuria/phagia/dipsia- no      Visual problems- no Medications: Compliance- taking metformin, Victoza Hypoglycemic symptoms- rare, only occurs if she doesn't eat anything, eats something and feels better.  Patient notes for the last 1-2 months she's had a burning discomfort in the right anterior thigh. Feels as though though it is a pulling sensation. Tingly at times. No numbness or weakness. No back pain. Aleve helps. No burning or tingling below her knee. No pain.  Morbid obesity: Discussed diet today. Eats a lot of trail mix. Leftover [place] for lunch. Some vegetables. Some soda and sweet tea. She is unsure if she is ready to make any true changes with this. She does not exercise currently.   PMH: nonsmoker.   ROS see history of present illness  Objective  Physical Exam Vitals:   01/31/16 1531  BP: 124/82  Pulse: 86  Temp: 98.6 F (37 C)    BP Readings from Last 3 Encounters:  01/31/16 124/82  10/07/15 114/76  09/09/15 126/80   Wt Readings from Last 3 Encounters:  01/31/16 289 lb 3.2 oz (131.2 kg)  10/07/15 285 lb 3.2 oz (129.4 kg)  09/09/15 287 lb 6.4 oz (130.4 kg)    Physical Exam  Constitutional: She is well-developed, well-nourished, and in no distress.  Cardiovascular: Normal rate, regular rhythm and normal heart sounds.   Pulmonary/Chest: Effort normal and breath sounds normal.  Musculoskeletal: She exhibits no edema.  No tenderness bilateral thighs or hips, full range of motion with internal and external range of motion bilateral hips with no tenderness  Neurological: She is alert. Gait normal.  5 out of 5 strength bilateral  quads, hamstrings, plantar flexion, and dorsiflexion, sensation to light touch intact bilateral lower extremities  Skin: Skin is warm and dry.     Assessment/Plan: Please see individual problem list.  Hypertension At goal. Continue current medications. Check CMP.  Diabetes mellitus type 2 in obese A1c today. Continue Victoza and metformin.  Severe obesity (BMI >= 40) Discussed diet and exercise at length. She'll start exercising 2-3 days a week. Given diet information.  Burning pain right anterior thigh Patient describes neuropathic burning and tingling discomfort in her anterior thigh. She has no abnormalities on exam. No back pain noted with this. No numbness or weakness. Could be nerve impingement from her back or neuropathy. Discussed options for evaluation and management including imaging, physical therapy, exercises at home, and anti-inflammatory. She has been using Aleve and opted to continue this and monitor. If not improving would favor imaging. She is given return precautions.   Orders Placed This Encounter  Procedures  . HgB A1c  . Comp Met (CMET)    Tommi Rumps, MD [place]

## 2016-02-01 ENCOUNTER — Encounter: Payer: Self-pay | Admitting: Family Medicine

## 2016-02-01 LAB — COMPREHENSIVE METABOLIC PANEL
ALBUMIN: 3.7 g/dL (ref 3.5–5.2)
ALT: 15 U/L (ref 0–35)
AST: 15 U/L (ref 0–37)
Alkaline Phosphatase: 73 U/L (ref 39–117)
BILIRUBIN TOTAL: 0.3 mg/dL (ref 0.2–1.2)
BUN: 15 mg/dL (ref 6–23)
CALCIUM: 8.9 mg/dL (ref 8.4–10.5)
CHLORIDE: 100 meq/L (ref 96–112)
CO2: 30 meq/L (ref 19–32)
CREATININE: 0.77 mg/dL (ref 0.40–1.20)
GFR: 106.93 mL/min (ref >60.00)
Glucose, Bld: 111 mg/dL — ABNORMAL HIGH (ref 70–99)
Potassium: 3.7 mEq/L (ref 3.5–5.1)
Sodium: 136 mEq/L (ref 135–145)
Total Protein: 7.3 g/dL (ref 6.0–8.3)

## 2016-02-01 LAB — HEMOGLOBIN A1C: Hgb A1c MFr Bld: 6.9 % — ABNORMAL HIGH (ref 4.6–6.5)

## 2016-04-10 ENCOUNTER — Ambulatory Visit (INDEPENDENT_AMBULATORY_CARE_PROVIDER_SITE_OTHER): Payer: 59 | Admitting: Family Medicine

## 2016-04-10 ENCOUNTER — Encounter: Payer: Self-pay | Admitting: Family Medicine

## 2016-04-10 ENCOUNTER — Telehealth: Payer: Self-pay | Admitting: Family Medicine

## 2016-04-10 DIAGNOSIS — R52 Pain, unspecified: Secondary | ICD-10-CM | POA: Diagnosis not present

## 2016-04-10 MED ORDER — NORETHINDRONE 0.35 MG PO TABS: 1.0000 | ORAL_TABLET | Freq: Every day | ORAL | 6 refills | Status: DC

## 2016-04-10 NOTE — Patient Instructions (Signed)
Nice to see you. Please start working on zumba as we discussed. You can work on portion control by decreasing the size plate you use. You should try to get protein with each meal. It is okay to cheat every once in a while on your diet.

## 2016-04-10 NOTE — Telephone Encounter (Signed)
Sent to pharmacy 

## 2016-04-10 NOTE — Progress Notes (Signed)
  Tommi Rumps, MD Phone: [phone]  Veronica Norton is a 41 y.o. female who presents today for follow-up.  Obesity: Patient has been working on dietary changes. She's eating more salads with leafy greens, strawberries, pineapple, and chicken breast on it. She's using fat-free dressings. She is drinking a lot more water than other drinks. Also eating much more baked chicken and vegetables. Greek yogurt and strawberries for breakfast typically. Occasionally cheats on her diet. She has not been exercising though plans to start Zumba through her church next week.  Patient also reports the burning discomfort she had her right anterior thigh has resolved and has not recurred.  ROS see history of present illness  Objective  Physical Exam Vitals:   04/10/16 1335  BP: 124/72  Pulse: 83  Temp: 98.2 F (36.8 C)    BP Readings from Last 3 Encounters:  04/10/16 124/72  01/31/16 124/82  10/07/15 114/76   Wt Readings from Last 3 Encounters:  04/10/16 290 lb 9.6 oz (131.8 kg)  01/31/16 289 lb 3.2 oz (131.2 kg)  10/07/15 285 lb 3.2 oz (129.4 kg)    Physical Exam  Constitutional: No distress.  Cardiovascular: Normal rate, regular rhythm and normal heart sounds.   Pulmonary/Chest: Effort normal and breath sounds normal.  Skin: She is not diaphoretic.     Assessment/Plan: Please see individual problem list.  Severe obesity (BMI >= 40) Weight is slightly up from last visit. Discussed exercise. She has a plan in place to start [place]. Discussed dietary changes as well. Discussed decreasing portion sizes and offered the suggestion of decreasing the plate size. She will additionally get protein with each meal and eat as many vegetables as she would like. Follow up in one month for this   Tommi Rumps, MD [place]

## 2016-04-10 NOTE — Assessment & Plan Note (Signed)
Has resolved. Continue to monitor for recurrence.

## 2016-04-10 NOTE — Telephone Encounter (Signed)
norethindrone (CAMILA) 0.35 MG tablet take 1 tablet by mouth daily. Qty 84

## 2016-04-10 NOTE — Assessment & Plan Note (Addendum)
Weight is slightly up from last visit. Discussed exercise. She has a plan in place to start [place]. Discussed dietary changes as well. Discussed decreasing portion sizes and offered the suggestion of decreasing the plate size. She will additionally get protein with each meal and eat as many vegetables as she would like. Follow up in one month for this

## 2016-05-26 ENCOUNTER — Ambulatory Visit: Payer: 59 | Admitting: Family Medicine

## 2016-05-31 ENCOUNTER — Other Ambulatory Visit: Payer: Self-pay | Admitting: Family Medicine

## 2016-05-31 DIAGNOSIS — Z01419 Encounter for gynecological examination (general) (routine) without abnormal findings: Secondary | ICD-10-CM | POA: Diagnosis not present

## 2016-05-31 NOTE — Telephone Encounter (Signed)
Please determine what the patient uses this cream for. Thanks.

## 2016-05-31 NOTE — Telephone Encounter (Signed)
Left message to return call 

## 2016-05-31 NOTE — Telephone Encounter (Signed)
Last filled by Dr.Walker 12/07/14 60 g 0rf Last OV 04/10/16

## 2016-06-02 NOTE — Telephone Encounter (Signed)
Patient uses this on her neck as needed after wearing jewelry

## 2016-06-07 ENCOUNTER — Other Ambulatory Visit: Payer: Self-pay

## 2016-06-07 MED ORDER — METFORMIN HCL ER 500 MG PO TB24: 1000.0000 mg | ORAL_TABLET | Freq: Two times a day (BID) | ORAL | 3 refills | Status: DC

## 2016-06-07 NOTE — Telephone Encounter (Signed)
Last OV 04/10/16 last filled by Dr.Walker 06/07/15 120 3rf

## 2016-06-21 ENCOUNTER — Encounter: Payer: Self-pay | Admitting: Family Medicine

## 2016-06-21 ENCOUNTER — Ambulatory Visit (INDEPENDENT_AMBULATORY_CARE_PROVIDER_SITE_OTHER): Payer: 59 | Admitting: Family Medicine

## 2016-06-21 VITALS — BP 120/80 | HR 68 | Temp 98.4°F | Wt 290.8 lb

## 2016-06-21 DIAGNOSIS — E1169 Type 2 diabetes mellitus with other specified complication: Secondary | ICD-10-CM

## 2016-06-21 DIAGNOSIS — E669 Obesity, unspecified: Secondary | ICD-10-CM | POA: Diagnosis not present

## 2016-06-21 DIAGNOSIS — I10 Essential (primary) hypertension: Secondary | ICD-10-CM | POA: Diagnosis not present

## 2016-06-21 LAB — COMPREHENSIVE METABOLIC PANEL
ALBUMIN: 3.7 g/dL (ref 3.5–5.2)
ALK PHOS: 68 U/L (ref 39–117)
ALT: 17 U/L (ref 0–35)
AST: 13 U/L (ref 0–37)
BUN: 14 mg/dL (ref 6–23)
CALCIUM: 8.8 mg/dL (ref 8.4–10.5)
CHLORIDE: 103 meq/L (ref 96–112)
CO2: 26 mEq/L (ref 19–32)
Creatinine, Ser: 0.66 mg/dL (ref 0.40–1.20)
GFR: 127.5 mL/min (ref >60.00)
Glucose, Bld: 127 mg/dL — ABNORMAL HIGH (ref 70–99)
POTASSIUM: 4.3 meq/L (ref 3.5–5.1)
SODIUM: 137 meq/L (ref 135–145)
TOTAL PROTEIN: 7.1 g/dL (ref 6.0–8.3)
Total Bilirubin: 0.3 mg/dL (ref 0.2–1.2)

## 2016-06-21 LAB — HEMOGLOBIN A1C: HEMOGLOBIN A1C: 7.5 % — AB (ref 4.6–6.5)

## 2016-06-21 MED ORDER — HYDROCHLOROTHIAZIDE 12.5 MG PO CAPS: 12.5000 mg | ORAL_CAPSULE | Freq: Every day | ORAL | 1 refills | Status: DC

## 2016-06-21 NOTE — Patient Instructions (Signed)
Nice to see you. We will discontinue your lisinopril. I sent in hydrochlorothiazide for you to take. He should start monitoring her blood pressure. Goal is less than 130/80 or less. You should continue to work on diet and exercise. If you do become pregnant please let us and/or gynecologist know.

## 2016-06-21 NOTE — Assessment & Plan Note (Signed)
At goal. She reports she has a goal of getting pregnant at some point in the future. She is continuing to take birth control at this time. Discussed given her desire for pregnancy we need to discontinue the lisinopril. We will send in a new hydrochlorothiazide prescription for her. She'll monitor her blood pressure and if it starts to run higher she will let us know. We'll see her back in about a month.

## 2016-06-21 NOTE — Assessment & Plan Note (Signed)
Encouraged continued diet and exercise. Advised to monitor how her clothes fit as opposed to monitoring the scales to start with. We'll see her back in about a month to recheck.

## 2016-06-21 NOTE — Assessment & Plan Note (Signed)
Check A1c today. Continue current medications.

## 2016-06-21 NOTE — Progress Notes (Signed)
Pre visit review using our clinic review tool, if applicable. No additional management support is needed unless otherwise documented below in the visit note. 

## 2016-06-21 NOTE — Progress Notes (Signed)
  Tommi Rumps, MD Phone: [phone]  Veronica Norton is a 41 y.o. female who presents today for f/u.  DIABETES Disease Monitoring: Blood Sugar ranges-not checking Polyuria/phagia/dipsia- no       Medications: Compliance- taking metformin (minimal loose stools), victoza Hypoglycemic symptoms- single episode when she did not eat, improved with eating.  HYPERTENSION  Disease Monitoring  Home BP Monitoring not checking Chest pain- no    Dyspnea- no Medications  Compliance-  Taking lisinopril/HCTZ.  Reports she discussed her blood pressure medications with her gynecologist who brought up concern regarding the lisinopril given the patient is going to try to get pregnant at some time in the future. Edema- no  Obesity: She started walking more in the last couple of weeks. She notes she feels better with this. Notes her clothes are fitting somewhat better. She has a poor diet at times and then other times has a good diet.   PMH: nonsmoker.   ROS see history of present illness  Objective  Physical Exam Vitals:   06/21/16 1002  BP: 120/80  Pulse: 68  Temp: 98.4 F (36.9 C)    BP Readings from Last 3 Encounters:  06/21/16 120/80  04/10/16 124/72  01/31/16 124/82   Wt Readings from Last 3 Encounters:  06/21/16 290 lb 12.8 oz (131.9 kg)  04/10/16 290 lb 9.6 oz (131.8 kg)  01/31/16 289 lb 3.2 oz (131.2 kg)    Physical Exam  Constitutional: No distress.  Cardiovascular: Normal rate, regular rhythm and normal heart sounds.   Pulmonary/Chest: Effort normal and breath sounds normal.  Musculoskeletal: She exhibits no edema.  Neurological: She is alert. Gait normal.  Skin: Skin is warm and dry. She is not diaphoretic.     Assessment/Plan: Please see individual problem list.  Hypertension At goal. She reports she has a goal of getting pregnant at some point in the future. She is continuing to take birth control at this time. Discussed given her desire for pregnancy we need  to discontinue the lisinopril. We will send in a new hydrochlorothiazide prescription for her. She'll monitor her blood pressure and if it starts to run higher she will let us know. We'll see her back in about a month.  Diabetes mellitus type 2 in obese Check A1c today. Continue current medications.  Severe obesity (BMI >= 40) Encouraged continued diet and exercise. Advised to monitor how her clothes fit as opposed to monitoring the scales to start with. We'll see her back in about a month to recheck.   Orders Placed This Encounter  Procedures  . Comp Met (CMET)  . HgB A1c    Meds ordered this encounter  Medications  . hydrochlorothiazide (MICROZIDE) 12.5 MG capsule    Sig: Take 1 capsule (12.5 mg total) by mouth daily.    Dispense:  90 capsule    Refill:  1    Tommi Rumps, MD [place]

## 2016-06-24 ENCOUNTER — Encounter: Payer: Self-pay | Admitting: Emergency Medicine

## 2016-06-24 ENCOUNTER — Emergency Department
Admission: EM | Admit: 2016-06-24 | Discharge: 2016-06-24 | Disposition: A | Discharge status: Home / Self Care | Payer: 59 | Attending: Emergency Medicine | Admitting: Emergency Medicine

## 2016-06-24 DIAGNOSIS — E119 Type 2 diabetes mellitus without complications: Secondary | ICD-10-CM | POA: Diagnosis not present

## 2016-06-24 DIAGNOSIS — Z794 Long term (current) use of insulin: Secondary | ICD-10-CM | POA: Diagnosis not present

## 2016-06-24 DIAGNOSIS — S29012A Strain of muscle and tendon of back wall of thorax, initial encounter: Secondary | ICD-10-CM

## 2016-06-24 DIAGNOSIS — Z79899 Other long term (current) drug therapy: Secondary | ICD-10-CM | POA: Diagnosis not present

## 2016-06-24 DIAGNOSIS — Y9389 Activity, other specified: Secondary | ICD-10-CM | POA: Insufficient documentation

## 2016-06-24 DIAGNOSIS — Y999 Unspecified external cause status: Secondary | ICD-10-CM | POA: Insufficient documentation

## 2016-06-24 DIAGNOSIS — S161XXA Strain of muscle, fascia and tendon at neck level, initial encounter: Secondary | ICD-10-CM

## 2016-06-24 DIAGNOSIS — G44319 Acute post-traumatic headache, not intractable: Secondary | ICD-10-CM | POA: Diagnosis not present

## 2016-06-24 DIAGNOSIS — I10 Essential (primary) hypertension: Secondary | ICD-10-CM | POA: Diagnosis not present

## 2016-06-24 DIAGNOSIS — S199XXA Unspecified injury of neck, initial encounter: Secondary | ICD-10-CM | POA: Diagnosis present

## 2016-06-24 DIAGNOSIS — Y92410 Unspecified street and highway as the place of occurrence of the external cause: Secondary | ICD-10-CM | POA: Insufficient documentation

## 2016-06-24 MED ORDER — PROMETHAZINE HCL 25 MG/ML IJ SOLN
25.0000 mg | Freq: Once | INTRAMUSCULAR | Status: AC
  Administered 2016-06-24: 25 mg via INTRAMUSCULAR
  Filled 2016-06-24: qty 1

## 2016-06-24 MED ORDER — KETOROLAC TROMETHAMINE 30 MG/ML IJ SOLN
30.0000 mg | Freq: Once | INTRAMUSCULAR | Status: AC
  Administered 2016-06-24: 30 mg via INTRAMUSCULAR
  Filled 2016-06-24: qty 1

## 2016-06-24 MED ORDER — KETOROLAC TROMETHAMINE 10 MG PO TABS: 10.0000 mg | ORAL_TABLET | Freq: Four times a day (QID) | ORAL | 0 refills | Status: DC | PRN

## 2016-06-24 MED ORDER — ORPHENADRINE CITRATE 30 MG/ML IJ SOLN
60.0000 mg | Freq: Once | INTRAMUSCULAR | Status: AC
  Administered 2016-06-24: 60 mg via INTRAMUSCULAR
  Filled 2016-06-24: qty 2

## 2016-06-24 MED ORDER — METHOCARBAMOL 500 MG PO TABS: 500.0000 mg | ORAL_TABLET | Freq: Four times a day (QID) | ORAL | 0 refills | Status: DC
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
## 2016-06-24 NOTE — ED Notes (Signed)
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
## 2016-06-30 ENCOUNTER — Other Ambulatory Visit: Payer: Self-pay | Admitting: Family Medicine

## 2016-06-30 DIAGNOSIS — I10 Essential (primary) hypertension: Secondary | ICD-10-CM

## 2016-06-30 DIAGNOSIS — E669 Obesity, unspecified: Principal | ICD-10-CM

## 2016-06-30 DIAGNOSIS — E1169 Type 2 diabetes mellitus with other specified complication: Secondary | ICD-10-CM

## 2016-06-30 MED ORDER — LIRAGLUTIDE 18 MG/3ML ~~LOC~~ SOPN: 1.2000 mg | PEN_INJECTOR | Freq: Every day | SUBCUTANEOUS | 2 refills | Status: DC

## 2016-07-06 ENCOUNTER — Encounter: Payer: Self-pay | Admitting: Family Medicine

## 2016-07-06 ENCOUNTER — Ambulatory Visit (INDEPENDENT_AMBULATORY_CARE_PROVIDER_SITE_OTHER): Payer: PRIVATE HEALTH INSURANCE | Admitting: Family Medicine

## 2016-07-06 VITALS — BP 136/90 | HR 80 | Temp 98.3°F | Wt 288.8 lb

## 2016-07-06 DIAGNOSIS — N926 Irregular menstruation, unspecified: Secondary | ICD-10-CM | POA: Diagnosis not present

## 2016-07-06 LAB — HCG, QUANTITATIVE, PREGNANCY: Quantitative HCG: 0 m[IU]/mL

## 2016-07-06 LAB — POCT URINE PREGNANCY: PREG TEST UR: NEGATIVE
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
## 2016-07-06 NOTE — Assessment & Plan Note (Signed)
Patient with the missed menstrual cycle. Urine pregnancy test negative. Given the regularity of her prior menstrual cycles we will check a beta hCG.
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
## 2016-07-06 NOTE — Progress Notes (Signed)
Pre visit review using our clinic review tool, if applicable. No additional management support is needed unless otherwise documented below in the visit note. 

## 2016-07-07 ENCOUNTER — Telehealth: Payer: Self-pay | Admitting: Family Medicine

## 2016-07-07 DIAGNOSIS — M542 Cervicalgia: Secondary | ICD-10-CM
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
## 2016-07-11 ENCOUNTER — Telehealth: Payer: Self-pay

## 2016-07-11 ENCOUNTER — Ambulatory Visit (INDEPENDENT_AMBULATORY_CARE_PROVIDER_SITE_OTHER): Payer: PRIVATE HEALTH INSURANCE

## 2016-07-11 DIAGNOSIS — M542 Cervicalgia: Secondary | ICD-10-CM

## 2016-07-11 DIAGNOSIS — S199XXA Unspecified injury of neck, initial encounter: Secondary | ICD-10-CM | POA: Diagnosis not present

## 2016-07-11 NOTE — Telephone Encounter (Signed)
-----   Message from Leone Haven, MD sent at 07/11/2016  3:36 PM EDT ----- Please let the patient know that her x-ray was negative. She should monitor for recurrence of her symptoms. Thanks.

## 2016-07-11 NOTE — Telephone Encounter (Signed)
Patient notified

## 2016-07-11 NOTE — Telephone Encounter (Signed)
Left message to return call 

## 2016-08-15 ENCOUNTER — Ambulatory Visit (INDEPENDENT_AMBULATORY_CARE_PROVIDER_SITE_OTHER): Payer: 59 | Admitting: Family Medicine

## 2016-08-15 ENCOUNTER — Encounter: Payer: Self-pay | Admitting: Family Medicine

## 2016-08-15 DIAGNOSIS — F439 Reaction to severe stress, unspecified: Secondary | ICD-10-CM | POA: Diagnosis not present

## 2016-08-15 DIAGNOSIS — E1169 Type 2 diabetes mellitus with other specified complication: Secondary | ICD-10-CM

## 2016-08-15 DIAGNOSIS — E669 Obesity, unspecified: Secondary | ICD-10-CM

## 2016-08-15 MED ORDER — LIRAGLUTIDE 18 MG/3ML ~~LOC~~ SOPN: 0.6000 mg | PEN_INJECTOR | Freq: Every day | SUBCUTANEOUS | 2 refills | Status: DC

## 2016-08-15 NOTE — Patient Instructions (Signed)
Nice to see you. Please consider whether or not you want to see a nutritionist. You could try [facility] as you asked about. I would encourage you to see if they have physicians or nurse practitioners practicing there. Please remember to take your metformin as prescribed. Please do not increase the dose of your Victoza as was previously discussed.

## 2016-08-15 NOTE — Assessment & Plan Note (Signed)
Reports fairly significant stress related to work. Encouraged her to consider getting a different job if she is able to. She is already working on this.

## 2016-08-15 NOTE — Progress Notes (Signed)
  Tommi Rumps, MD Phone: [phone]  Veronica Norton is a 41 y.o. female who presents today for follow-up.  Obesity: Weight is up 2 pounds. She does note her clothes feel like they're fitting better. She is on her feet more at work. She is somewhat apathetic dieting and exercise this. Has not really worked on her diet very much. She's been eating more bread. Trying to get vegetables though not every day. Eats crackers for breakfast. She's interested in [facility] which is an office in [place] that works on weight loss.  Diabetes: Not checking her sugars. She did not increase the dose of Victoza. Today she reports she is taking her metformin as prescribed about twice a week. Forgets the other days. No polyuria or polydipsia.  She does report stress related to her job. She reports she is looking for a new job and is over the stress and over her current job. She denies depression.  PMH: nonsmoker.   ROS see history of present illness  Objective  Physical Exam Vitals:   08/15/16 1052  BP: 118/88  Pulse: 76  Temp: 98.4 F (36.9 C)    BP Readings from Last 3 Encounters:  08/15/16 118/88  07/06/16 136/90  06/24/16 136/89   Wt Readings from Last 3 Encounters:  08/15/16 290 lb 3.2 oz (131.6 kg)  07/06/16 288 lb 12.8 oz (131 kg)  06/24/16 290 lb (131.5 kg)    Physical Exam  Constitutional: No distress.  Cardiovascular: Normal rate, regular rhythm and normal heart sounds.   Pulmonary/Chest: Effort normal and breath sounds normal.  Musculoskeletal: She exhibits no edema.  Neurological: She is alert. Gait normal.  Skin: She is not diaphoretic.     Assessment/Plan: Please see individual problem list.  Diabetes mellitus type 2 in obese I discussed methods for her to remember to take her metformin. We had previously discussed increasing her Victoza though she will remain on the 0.6 mg daily. She'll return for an A1c as outlined in the AVS.  Severe obesity (BMI >=  40) Continued to discuss diet and exercise with the patient. She seems unmotivated with this. Discussed trying to find some motivating factor. Discussed that if she were to do the [facility] office she would need to make sure there were licensed professional such as in M.D., NP, PA, or dietitian or nutritionist working there.  Stress Reports fairly significant stress related to work. Encouraged her to consider getting a different job if she is able to. She is already working on this.  Tommi Rumps, MD [place]

## 2016-08-15 NOTE — Assessment & Plan Note (Signed)
I discussed methods for her to remember to take her metformin. We had previously discussed increasing her Victoza though she will remain on the 0.6 mg daily. She'll return for an A1c as outlined in the AVS.

## 2016-08-15 NOTE — Assessment & Plan Note (Signed)
Continued to discuss diet and exercise with the patient. She seems unmotivated with this. Discussed trying to find some motivating factor. Discussed that if she were to do the [facility] office she would need to make sure there were licensed professional such as in M.D., NP, PA, or dietitian or nutritionist working there.

## 2016-08-21 DIAGNOSIS — N951 Menopausal and female climacteric states: Secondary | ICD-10-CM | POA: Diagnosis not present

## 2016-09-01 DIAGNOSIS — E119 Type 2 diabetes mellitus without complications: Secondary | ICD-10-CM | POA: Diagnosis not present

## 2016-09-01 DIAGNOSIS — I10 Essential (primary) hypertension: Secondary | ICD-10-CM | POA: Diagnosis not present

## 2016-09-14 DIAGNOSIS — I10 Essential (primary) hypertension: Secondary | ICD-10-CM | POA: Diagnosis not present

## 2016-09-14 DIAGNOSIS — E119 Type 2 diabetes mellitus without complications: Secondary | ICD-10-CM | POA: Diagnosis not present

## 2016-09-20 DIAGNOSIS — E119 Type 2 diabetes mellitus without complications: Secondary | ICD-10-CM | POA: Diagnosis not present

## 2016-09-20 DIAGNOSIS — I10 Essential (primary) hypertension: Secondary | ICD-10-CM | POA: Diagnosis not present

## 2016-09-25 ENCOUNTER — Telehealth: Payer: Self-pay | Admitting: Radiology

## 2016-09-25 ENCOUNTER — Other Ambulatory Visit: Payer: Self-pay | Admitting: Family Medicine

## 2016-09-25 DIAGNOSIS — E669 Obesity, unspecified: Principal | ICD-10-CM

## 2016-09-25 DIAGNOSIS — E1169 Type 2 diabetes mellitus with other specified complication: Secondary | ICD-10-CM

## 2016-09-25 NOTE — Telephone Encounter (Signed)
Pt of Dr. Caryl Bis coming for labs tomorrow, please place future orders. Thank you.

## 2016-09-25 NOTE — Telephone Encounter (Signed)
Dr.Cook entered order

## 2016-09-26 ENCOUNTER — Other Ambulatory Visit (INDEPENDENT_AMBULATORY_CARE_PROVIDER_SITE_OTHER): Payer: 59

## 2016-09-26 DIAGNOSIS — E669 Obesity, unspecified: Secondary | ICD-10-CM | POA: Diagnosis not present

## 2016-09-26 DIAGNOSIS — E1169 Type 2 diabetes mellitus with other specified complication: Secondary | ICD-10-CM

## 2016-09-26 LAB — HEMOGLOBIN A1C: HEMOGLOBIN A1C: 8.2 % — AB (ref 4.6–6.5)

## 2016-09-28 DIAGNOSIS — I10 Essential (primary) hypertension: Secondary | ICD-10-CM | POA: Diagnosis not present

## 2016-09-28 DIAGNOSIS — E119 Type 2 diabetes mellitus without complications: Secondary | ICD-10-CM | POA: Diagnosis not present

## 2016-10-05 ENCOUNTER — Telehealth: Payer: Self-pay

## 2016-10-05 NOTE — Telephone Encounter (Signed)
Error

## 2016-11-20 ENCOUNTER — Other Ambulatory Visit: Payer: Self-pay

## 2016-11-20 DIAGNOSIS — I10 Essential (primary) hypertension: Secondary | ICD-10-CM

## 2016-11-20 DIAGNOSIS — E1169 Type 2 diabetes mellitus with other specified complication: Secondary | ICD-10-CM

## 2016-11-20 DIAGNOSIS — E669 Obesity, unspecified: Principal | ICD-10-CM

## 2016-11-20 MED ORDER — INSULIN PEN NEEDLE 31G X 8 MM MISC: 1.0000 "application " | Freq: Every day | 3 refills | Status: DC

## 2016-11-20 NOTE — Telephone Encounter (Signed)
Last OV 08/15/16 last filled by Dr.Walker 09/10/15

## 2016-12-15 ENCOUNTER — Ambulatory Visit (INDEPENDENT_AMBULATORY_CARE_PROVIDER_SITE_OTHER): Payer: 59 | Admitting: Family Medicine

## 2016-12-15 ENCOUNTER — Encounter: Payer: Self-pay | Admitting: Family Medicine

## 2016-12-15 VITALS — BP 110/80 | HR 89 | Temp 98.4°F | Wt 279.4 lb

## 2016-12-15 DIAGNOSIS — E1169 Type 2 diabetes mellitus with other specified complication: Secondary | ICD-10-CM

## 2016-12-15 DIAGNOSIS — I10 Essential (primary) hypertension: Secondary | ICD-10-CM | POA: Diagnosis not present

## 2016-12-15 DIAGNOSIS — E669 Obesity, unspecified: Secondary | ICD-10-CM | POA: Diagnosis not present

## 2016-12-15 DIAGNOSIS — L309 Dermatitis, unspecified: Secondary | ICD-10-CM | POA: Insufficient documentation

## 2016-12-15 DIAGNOSIS — Z23 Encounter for immunization: Secondary | ICD-10-CM | POA: Diagnosis not present

## 2016-12-15 MED ORDER — FLUTICASONE PROPIONATE 0.05 % EX CREA: TOPICAL_CREAM | CUTANEOUS | 0 refills | Status: DC

## 2016-12-15 NOTE — Assessment & Plan Note (Signed)
She'll have the A1c with her fasting lab work in 2 weeks.

## 2016-12-15 NOTE — Patient Instructions (Signed)
Nice to see you. Please monitor the rash and use the topical steroid. You can also take Claritin or Zyrtec to help with the itching. If he does not improve please let us know. Please continue with diet and exercise.

## 2016-12-15 NOTE — Progress Notes (Signed)
  Tommi Rumps, MD Phone: [phone]  Veronica Norton is a 41 y.o. female who presents today for follow-up.  Diabetes: Not checking sugars. Taking Victoza and metformin. No polyuria or polydipsia. One episode of hypoglycemia when she did not eat. No history of thyroid cancer in her or her family. No men 2 cancers either.  Hypertension: Not checking. Taking HCTZ. No chest pain, shortness breath, or edema.  Obesity: She has changed her diet pretty drastically. Eating yogurt, fruits, grilled chicken and vegetables, and occasional sandwiches. She is doing some exercises at home and line dancing. She is going to start going to the gym.  Rash: She notes rash on the dorsal aspect of her bilateral hands near her knuckles over the last several days. Papular rash with no erythema. It does itch. She does have a history of eczema. She's tried fluticasone cream on it.  PMH: nonsmoker.   ROS see history of present illness  Objective  Physical Exam Vitals:   12/15/16 1013  BP: 110/80  Pulse: 89  Temp: 98.4 F (36.9 C)  SpO2: 98%    BP Readings from Last 3 Encounters:  12/15/16 110/80  08/15/16 118/88  07/06/16 136/90   Wt Readings from Last 3 Encounters:  12/15/16 279 lb 6.4 oz (126.7 kg)  08/15/16 290 lb 3.2 oz (131.6 kg)  07/06/16 288 lb 12.8 oz (131 kg)    Physical Exam  Cardiovascular: Normal rate, regular rhythm and normal heart sounds.   Pulmonary/Chest: Effort normal and breath sounds normal.  Musculoskeletal: She exhibits no edema.  Neurological: She is alert. Gait normal.  Skin: She is not diaphoretic.   skin colored papular rash on her knuckles bilaterally, no tenderness, no erythema   Assessment/Plan: Please see individual problem list.  Hypertension Well-controlled. Continue current medication. Return for lab work.  Diabetes mellitus type 2 in obese She'll have the A1c with her fasting lab work in 2 weeks.  Severe obesity (BMI >= 40) Patient is down 11  pounds. She'll continue to work on diet and exercise.  Eczema Rash on hands appears consistent with eczema. She'll trial topical steroid. She can add Claritin or Zyrtec.   Orders Placed This Encounter  Procedures  . Flu Vaccine QUAD 36+ mos IM  . Hemoglobin A1c    Standing Status:   Future    Standing Expiration Date:   12/15/2017  . Lipid panel    Standing Status:   Future    Standing Expiration Date:   12/15/2017  . Comp Met (CMET)    Standing Status:   Future    Standing Expiration Date:   12/15/2017    Meds ordered this encounter  Medications  . fluticasone (CUTIVATE) 0.05 % cream    Sig: APPLY TO AFFECTED AREA UP TO TWICE A DAY AS NEEDED FOR RASH/ITCHING    Dispense:  60 g    Refill:  0    Tommi Rumps, MD [place]

## 2016-12-15 NOTE — Assessment & Plan Note (Signed)
Patient is down 11 pounds. She'll continue to work on diet and exercise.

## 2016-12-15 NOTE — Assessment & Plan Note (Signed)
Rash on hands appears consistent with eczema. She'll trial topical steroid. She can add Claritin or Zyrtec.

## 2016-12-15 NOTE — Assessment & Plan Note (Signed)
Well-controlled. Continue current medication. Return for lab work.

## 2016-12-28 ENCOUNTER — Other Ambulatory Visit: Payer: Self-pay | Admitting: Family Medicine

## 2016-12-28 ENCOUNTER — Other Ambulatory Visit (INDEPENDENT_AMBULATORY_CARE_PROVIDER_SITE_OTHER): Payer: 59

## 2016-12-28 DIAGNOSIS — E1169 Type 2 diabetes mellitus with other specified complication: Secondary | ICD-10-CM

## 2016-12-28 DIAGNOSIS — I10 Essential (primary) hypertension: Secondary | ICD-10-CM

## 2016-12-28 DIAGNOSIS — E669 Obesity, unspecified: Secondary | ICD-10-CM

## 2016-12-28 LAB — COMPREHENSIVE METABOLIC PANEL
ALK PHOS: 57 U/L (ref 39–117)
ALT: 15 U/L (ref 0–35)
AST: 15 U/L (ref 0–37)
Albumin: 3.8 g/dL (ref 3.5–5.2)
BILIRUBIN TOTAL: 0.4 mg/dL (ref 0.2–1.2)
BUN: 16 mg/dL (ref 6–23)
CO2: 28 mEq/L (ref 19–32)
Calcium: 9.2 mg/dL (ref 8.4–10.5)
Chloride: 101 mEq/L (ref 96–112)
Creatinine, Ser: 0.78 mg/dL (ref 0.40–1.20)
GFR: 104.87 mL/min (ref >60.00)
Glucose, Bld: 101 mg/dL — ABNORMAL HIGH (ref 70–99)
Potassium: 3.9 mEq/L (ref 3.5–5.1)
Sodium: 135 mEq/L (ref 135–145)
TOTAL PROTEIN: 7.2 g/dL (ref 6.0–8.3)

## 2016-12-28 LAB — LIPID PANEL
Cholesterol: 123 mg/dL (ref 0–200)
HDL: 42.1 mg/dL (ref >39.00)
LDL Cholesterol: 68 mg/dL (ref 0–99)
NONHDL: 81.21
Total CHOL/HDL Ratio: 3
Triglycerides: 68 mg/dL (ref 0.0–149.0)
VLDL: 13.6 mg/dL (ref 0.0–40.0)

## 2016-12-28 LAB — HEMOGLOBIN A1C: HEMOGLOBIN A1C: 6.9 % — AB (ref 4.6–6.5)

## 2017-04-17 ENCOUNTER — Ambulatory Visit: Payer: 59 | Admitting: Family Medicine

## 2017-04-25 ENCOUNTER — Other Ambulatory Visit: Payer: Self-pay | Admitting: Family Medicine

## 2017-04-25 DIAGNOSIS — E1169 Type 2 diabetes mellitus with other specified complication: Secondary | ICD-10-CM

## 2017-04-25 DIAGNOSIS — E669 Obesity, unspecified: Principal | ICD-10-CM

## 2017-05-29 ENCOUNTER — Ambulatory Visit: Payer: 59 | Admitting: Family Medicine

## 2017-07-03 ENCOUNTER — Encounter: Payer: Self-pay | Admitting: Family Medicine

## 2017-07-03 ENCOUNTER — Ambulatory Visit: Payer: 59 | Admitting: Family Medicine

## 2017-07-03 ENCOUNTER — Other Ambulatory Visit: Payer: Self-pay

## 2017-07-03 VITALS — BP 120/80 | HR 89 | Temp 98.3°F | Ht 65.0 in | Wt 279.2 lb

## 2017-07-03 DIAGNOSIS — E1169 Type 2 diabetes mellitus with other specified complication: Secondary | ICD-10-CM

## 2017-07-03 DIAGNOSIS — F329 Major depressive disorder, single episode, unspecified: Secondary | ICD-10-CM

## 2017-07-03 DIAGNOSIS — E669 Obesity, unspecified: Secondary | ICD-10-CM | POA: Diagnosis not present

## 2017-07-03 DIAGNOSIS — R5383 Other fatigue: Secondary | ICD-10-CM | POA: Diagnosis not present

## 2017-07-03 DIAGNOSIS — F419 Anxiety disorder, unspecified: Secondary | ICD-10-CM

## 2017-07-03 DIAGNOSIS — F32A Depression, unspecified: Secondary | ICD-10-CM | POA: Insufficient documentation

## 2017-07-03 DIAGNOSIS — I10 Essential (primary) hypertension: Secondary | ICD-10-CM | POA: Diagnosis not present

## 2017-07-03 LAB — COMPREHENSIVE METABOLIC PANEL
ALT: 16 U/L (ref 0–35)
AST: 15 U/L (ref 0–37)
Albumin: 3.8 g/dL (ref 3.5–5.2)
Alkaline Phosphatase: 74 U/L (ref 39–117)
BILIRUBIN TOTAL: 0.4 mg/dL (ref 0.2–1.2)
BUN: 13 mg/dL (ref 6–23)
CALCIUM: 8.9 mg/dL (ref 8.4–10.5)
CO2: 28 mEq/L (ref 19–32)
CREATININE: 0.71 mg/dL (ref 0.40–1.20)
Chloride: 101 mEq/L (ref 96–112)
GFR: 116.59 mL/min (ref >60.00)
Glucose, Bld: 130 mg/dL — ABNORMAL HIGH (ref 70–99)
Potassium: 4.1 mEq/L (ref 3.5–5.1)
SODIUM: 135 meq/L (ref 135–145)
Total Protein: 7.5 g/dL (ref 6.0–8.3)

## 2017-07-03 LAB — CBC WITH DIFFERENTIAL/PLATELET
BASOS ABS: 0 10*3/uL (ref 0.0–0.1)
Basophils Relative: 0.6 % (ref 0.0–3.0)
EOS ABS: 0.1 10*3/uL (ref 0.0–0.7)
Eosinophils Relative: 1.6 % (ref 0.0–5.0)
HCT: 43.1 % (ref 36.0–46.0)
Hemoglobin: 14.7 g/dL (ref 12.0–15.0)
LYMPHS ABS: 2.1 10*3/uL (ref 0.7–4.0)
Lymphocytes Relative: 30.5 % (ref 12.0–46.0)
MCHC: 34.2 g/dL (ref 30.0–36.0)
MCV: 97.7 fl (ref 78.0–100.0)
MONO ABS: 0.5 10*3/uL (ref 0.1–1.0)
Monocytes Relative: 7.6 % (ref 3.0–12.0)
NEUTROS PCT: 59.7 % (ref 43.0–77.0)
Neutro Abs: 4.1 10*3/uL (ref 1.4–7.7)
PLATELETS: 380 10*3/uL (ref 150.0–400.0)
RBC: 4.41 Mil/uL (ref 3.87–5.11)
RDW: 13.2 % (ref 11.5–15.5)
WBC: 6.9 10*3/uL (ref 4.0–10.5)

## 2017-07-03 LAB — MICROALBUMIN / CREATININE URINE RATIO
Creatinine,U: 130.6 mg/dL
Microalb Creat Ratio: 2 mg/g (ref 0.0–30.0)
Microalb, Ur: 2.7 mg/dL — ABNORMAL HIGH (ref 0.0–1.9)

## 2017-07-03 LAB — TSH: TSH: 1.44 u[IU]/mL (ref 0.35–4.50)

## 2017-07-03 LAB — HEMOGLOBIN A1C: HEMOGLOBIN A1C: 7.5 % — AB (ref 4.6–6.5)

## 2017-07-03 NOTE — Assessment & Plan Note (Signed)
I suspect some of her fatigue symptoms are related to anxiety and depression.  I offered medication though she declined this.  She opted for therapy.  Referral was placed.  She is given return precautions.

## 2017-07-03 NOTE — Assessment & Plan Note (Signed)
I suspect her fatigue is related to depression.  She does snore though has no other significant apnea symptoms.  Discussed lab evaluation with TSH and CBC.  If symptoms do not improve with treatment of depression would consider evaluation for sleep apnea.

## 2017-07-03 NOTE — Assessment & Plan Note (Signed)
At goal. Continue current regimen. 

## 2017-07-03 NOTE — Assessment & Plan Note (Signed)
She will start to work on diet and exercise.

## 2017-07-03 NOTE — Progress Notes (Signed)
Tommi Rumps, MD Phone: [phone]  Veronica Norton is a 42 y.o. female who presents today for f/u.  HYPERTENSION  Disease Monitoring  Home BP Monitoring not checking Chest pain- no    Dyspnea- no Medications  Compliance-  Taking HCTZ.   Edema- no  DIABETES Disease Monitoring: Blood Sugar ranges-not checking Polyuria/phagia/dipsia- some polyuria      Optho- saw last friday Medications: Compliance- taking victoza and metformin Hypoglycemic symptoms- rare, eats and improves  Obesity: Not exercising like she should.  Wants to start walking.  Her diet is up and down.  Not eating like she should.  Notes she feels some fatigue.  Just feels tired at times.  Does feel depressed and does not feel like going to work at times.  Some anxiety.  She gets about 6 hours of sleep at night.  No hypersomnia.  Wakes up tired at times.  Some snoring though no apnea.  No heat or cold intolerance.  She wonders about her thyroid.  Denies SI.     Social History   Tobacco Use  Smoking Status Never Smoker  Smokeless Tobacco Never Used     ROS see history of present illness  Objective  Physical Exam Vitals:   07/03/17 1004  BP: 120/80  Pulse: 89  Temp: 98.3 F (36.8 C)  SpO2: 98%    BP Readings from Last 3 Encounters:  07/03/17 120/80  12/15/16 110/80  08/15/16 118/88   Wt Readings from Last 3 Encounters:  07/03/17 279 lb 3.2 oz (126.6 kg)  12/15/16 279 lb 6.4 oz (126.7 kg)  08/15/16 290 lb 3.2 oz (131.6 kg)    Physical Exam  Constitutional: No distress.  Cardiovascular: Normal rate, regular rhythm and normal heart sounds.  Pulmonary/Chest: Effort normal and breath sounds normal.  Musculoskeletal: She exhibits no edema.  Neurological: She is alert.  Skin: Skin is warm and dry. She is not diaphoretic.   Diabetic Foot Exam - Simple   Simple Foot Form Diabetic Foot exam was performed with the following findings:  Yes 07/03/2017 10:36 AM  Visual Inspection See comments:   Yes Sensation Testing Intact to touch and monofilament testing bilaterally:  Yes Pulse Check Posterior Tibialis and Dorsalis pulse intact bilaterally:  Yes Comments Pes planus noted bilaterally, otherwise no deformities, ulcerations, or skin breakdown      Assessment/Plan: Please see individual problem list.  Hypertension At goal.  Continue current regimen.  Diabetes mellitus type 2 in obese Check lab work.  Continue current medication.  Foot exam completed.  Offered referral to sports medicine given flat feet though she deferred.  Severe obesity (BMI >= 40) She will start to work on diet and exercise.  Anxiety and depression I suspect some of her fatigue symptoms are related to anxiety and depression.  I offered medication though she declined this.  She opted for therapy.  Referral was placed.  She is given return precautions.  Fatigue due to depression I suspect her fatigue is related to depression.  She does snore though has no other significant apnea symptoms.  Discussed lab evaluation with TSH and CBC.  If symptoms do not improve with treatment of depression would consider evaluation for sleep apnea.   Health Maintenance: Patient sees gynecology for Pap smears and mammograms.  Orders Placed This Encounter  Procedures  . HgB A1c  . TSH  . Comp Met (CMET)  . CBC w/Diff  . Urine Microalbumin w/creat. ratio  . Ambulatory referral to Psychology    Referral Priority:  Routine    Referral Type:   Psychiatric    Referral Reason:   Specialty Services Required    Requested Specialty:   Psychology    Number of Visits Requested:   1    No orders of the defined types were placed in this encounter.    Tommi Rumps, MD [place]

## 2017-07-03 NOTE — Assessment & Plan Note (Addendum)
Check lab work.  Continue current medication.  Foot exam completed.  Offered referral to sports medicine given flat feet though she deferred.

## 2017-07-03 NOTE — Patient Instructions (Signed)
Nice to see you. We will get you to see the psychologist to discuss her anxiety and depression. Please try to work on more exercise and monitor your diet. We will check lab work today and contact you with the results.

## 2017-07-09 ENCOUNTER — Other Ambulatory Visit: Payer: Self-pay | Admitting: Family Medicine

## 2017-07-09 MED ORDER — METFORMIN HCL ER 500 MG PO TB24: 1000.0000 mg | ORAL_TABLET | Freq: Every day | ORAL | 1 refills | Status: DC

## 2017-07-17 ENCOUNTER — Ambulatory Visit: Payer: 59 | Admitting: Psychology

## 2017-07-24 ENCOUNTER — Ambulatory Visit: Payer: 59 | Admitting: Psychology

## 2017-08-07 ENCOUNTER — Ambulatory Visit (INDEPENDENT_AMBULATORY_CARE_PROVIDER_SITE_OTHER): Payer: 59 | Admitting: Psychology

## 2017-08-07 DIAGNOSIS — F4323 Adjustment disorder with mixed anxiety and depressed mood: Secondary | ICD-10-CM

## 2017-08-17 ENCOUNTER — Ambulatory Visit (INDEPENDENT_AMBULATORY_CARE_PROVIDER_SITE_OTHER): Payer: 59 | Admitting: Psychology

## 2017-08-17 DIAGNOSIS — F4323 Adjustment disorder with mixed anxiety and depressed mood: Secondary | ICD-10-CM | POA: Diagnosis not present

## 2017-08-21 DIAGNOSIS — Z01419 Encounter for gynecological examination (general) (routine) without abnormal findings: Secondary | ICD-10-CM | POA: Diagnosis not present

## 2017-08-21 DIAGNOSIS — Z1231 Encounter for screening mammogram for malignant neoplasm of breast: Secondary | ICD-10-CM | POA: Diagnosis not present

## 2017-08-28 ENCOUNTER — Ambulatory Visit (INDEPENDENT_AMBULATORY_CARE_PROVIDER_SITE_OTHER): Payer: 59 | Admitting: Psychology

## 2017-08-28 DIAGNOSIS — F4323 Adjustment disorder with mixed anxiety and depressed mood: Secondary | ICD-10-CM | POA: Diagnosis not present

## 2017-09-05 ENCOUNTER — Ambulatory Visit (INDEPENDENT_AMBULATORY_CARE_PROVIDER_SITE_OTHER): Payer: 59 | Admitting: Psychology

## 2017-09-05 DIAGNOSIS — F4323 Adjustment disorder with mixed anxiety and depressed mood: Secondary | ICD-10-CM

## 2017-09-21 ENCOUNTER — Ambulatory Visit: Payer: 59 | Admitting: Psychology

## 2017-09-27 ENCOUNTER — Other Ambulatory Visit: Payer: Self-pay | Admitting: Family Medicine

## 2017-10-04 ENCOUNTER — Ambulatory Visit: Payer: 59 | Admitting: Family Medicine

## 2017-10-04 ENCOUNTER — Telehealth: Payer: Self-pay

## 2017-10-04 NOTE — Telephone Encounter (Signed)
She can be scheduled at 4:30 on a Monday or Wednesday.

## 2017-10-04 NOTE — Telephone Encounter (Signed)
Please advise 

## 2017-10-04 NOTE — Telephone Encounter (Signed)
Copied from [place] [id number]. Topic: Appointment Scheduling - Scheduling Inquiry for Clinic >> Oct 04, 2017  8:24 AM Veronica Norton, NT wrote: Reason for CRM: patient had to cancel her appointment today with Dr. Caryl Bis due to car trouble. Patient would like to reschedule but he is booked through august and sept. Please contact patient to schedule.

## 2017-10-04 NOTE — Telephone Encounter (Signed)
scheduled

## 2017-10-05 ENCOUNTER — Ambulatory Visit: Payer: 59 | Admitting: Psychology

## 2017-10-10 ENCOUNTER — Ambulatory Visit (INDEPENDENT_AMBULATORY_CARE_PROVIDER_SITE_OTHER): Payer: 59 | Admitting: Family Medicine

## 2017-10-10 ENCOUNTER — Encounter: Payer: Self-pay | Admitting: Family Medicine

## 2017-10-10 VITALS — BP 128/78 | HR 85 | Temp 97.3°F | Resp 18 | Wt 282.0 lb

## 2017-10-10 DIAGNOSIS — E1169 Type 2 diabetes mellitus with other specified complication: Secondary | ICD-10-CM | POA: Diagnosis not present

## 2017-10-10 DIAGNOSIS — F419 Anxiety disorder, unspecified: Secondary | ICD-10-CM | POA: Diagnosis not present

## 2017-10-10 DIAGNOSIS — F32A Depression, unspecified: Secondary | ICD-10-CM

## 2017-10-10 DIAGNOSIS — I10 Essential (primary) hypertension: Secondary | ICD-10-CM | POA: Diagnosis not present

## 2017-10-10 DIAGNOSIS — E669 Obesity, unspecified: Secondary | ICD-10-CM | POA: Diagnosis not present

## 2017-10-10 DIAGNOSIS — F329 Major depressive disorder, single episode, unspecified: Secondary | ICD-10-CM

## 2017-10-10 LAB — POCT GLYCOSYLATED HEMOGLOBIN (HGB A1C): HEMOGLOBIN A1C: 7.5 % — AB (ref 4.0–5.6)

## 2017-10-10 MED ORDER — METFORMIN HCL ER 500 MG PO TB24
1000.0000 mg | ORAL_TABLET | Freq: Two times a day (BID) | ORAL | 1 refills | Status: DC
Start: 2017-10-10 — End: 2018-07-08

## 2017-10-10 NOTE — Patient Instructions (Signed)
Nice to see you. We are going to increase your dose of metformin to 1000 mg twice daily.  If you get an upset stomach with this or diarrhea please let us know. Please continue to see your therapist. If you develop thoughts of harming yourself or others please go to the emergency room.

## 2017-10-10 NOTE — Assessment & Plan Note (Signed)
Continue HCTZ

## 2017-10-10 NOTE — Assessment & Plan Note (Addendum)
A1c unchanged .  Increase metformin to 1000 mg twice daily.  She will monitor for abdominal discomfort and loose stools.  If these occur she will let us know.

## 2017-10-10 NOTE — Assessment & Plan Note (Signed)
Continues to have some issues with this.  Therapy has been beneficial.  We have deferred medication at this time.  Discussed that she should start doing things for herself.

## 2017-10-10 NOTE — Progress Notes (Signed)
  Tommi Rumps, MD Phone: [phone]  Veronica Norton is a 42 y.o. female who presents today for f/u.  CC: htn, dm, anxiety/depression  HYPERTENSION  Disease Monitoring  Home BP Monitoring not checking Chest pain- no    Dyspnea- no Medications  Compliance-  Taking HCTZ.  Edema- no  DIABETES Disease Monitoring: Blood Sugar ranges-not checking Polyuria/phagia/dipsia- no      Optho- UTD Medications: Compliance- taking metformin, victoza Hypoglycemic symptoms- no  Anxiety/depression: Patient reports not having much anxiety or depression.  She does note quite a bit of stress in her work life and home life.  She just does not feel like she has her mojo.  She has been doing therapy which has been helpful.  She is also been traveling some which has been helpful as well.  No SI or HI.    Social History   Tobacco Use  Smoking Status Never Smoker  Smokeless Tobacco Never Used     ROS see history of present illness  Objective  Physical Exam Vitals:   10/10/17 1627 10/10/17 1709  BP: 136/82 128/78  Pulse: 85   Resp: 18   Temp: (!) 97.3 F (36.3 C)   SpO2: 98%     BP Readings from Last 3 Encounters:  10/10/17 128/78  07/03/17 120/80  12/15/16 110/80   Wt Readings from Last 3 Encounters:  10/10/17 282 lb (127.9 kg)  07/03/17 279 lb 3.2 oz (126.6 kg)  12/15/16 279 lb 6.4 oz (126.7 kg)    Physical Exam  Constitutional: No distress.  Cardiovascular: Normal rate, regular rhythm and normal heart sounds.  Pulmonary/Chest: Effort normal and breath sounds normal.  Musculoskeletal: She exhibits no edema.  Neurological: She is alert.  Skin: Skin is warm and dry. She is not diaphoretic.     Assessment/Plan: Please see individual problem list.  Diabetes mellitus type 2 in obese A1c unchanged .  Increase metformin to 1000 mg twice daily.  She will monitor for abdominal discomfort and loose stools.  If these occur she will let us know.  Hypertension Continue  HCTZ.  Anxiety and depression Continues to have some issues with this.  Therapy has been beneficial.  We have deferred medication at this time.  Discussed that she should start doing things for herself.   Orders Placed This Encounter  Procedures  . POCT HgB A1C    Meds ordered this encounter  Medications  . metFORMIN (GLUCOPHAGE-XR) 500 MG 24 hr tablet    Sig: Take 2 tablets (1,000 mg total) by mouth 2 (two) times daily with a meal.    Dispense:  360 tablet    Refill:  [place], MD [place]

## 2017-10-16 ENCOUNTER — Ambulatory Visit (INDEPENDENT_AMBULATORY_CARE_PROVIDER_SITE_OTHER): Payer: 59 | Admitting: Psychology

## 2017-10-16 DIAGNOSIS — F4323 Adjustment disorder with mixed anxiety and depressed mood: Secondary | ICD-10-CM | POA: Diagnosis not present

## 2017-10-30 ENCOUNTER — Ambulatory Visit (INDEPENDENT_AMBULATORY_CARE_PROVIDER_SITE_OTHER): Payer: 59 | Admitting: Psychology

## 2017-10-30 DIAGNOSIS — F4323 Adjustment disorder with mixed anxiety and depressed mood: Secondary | ICD-10-CM

## 2017-11-20 ENCOUNTER — Ambulatory Visit (INDEPENDENT_AMBULATORY_CARE_PROVIDER_SITE_OTHER): Payer: 59 | Admitting: Psychology

## 2017-11-20 DIAGNOSIS — F4323 Adjustment disorder with mixed anxiety and depressed mood: Secondary | ICD-10-CM

## 2017-12-11 ENCOUNTER — Encounter (INDEPENDENT_AMBULATORY_CARE_PROVIDER_SITE_OTHER): Payer: Self-pay

## 2017-12-18 ENCOUNTER — Ambulatory Visit (INDEPENDENT_AMBULATORY_CARE_PROVIDER_SITE_OTHER): Payer: 59 | Admitting: Psychology

## 2017-12-18 DIAGNOSIS — F4323 Adjustment disorder with mixed anxiety and depressed mood: Secondary | ICD-10-CM | POA: Diagnosis not present

## 2017-12-27 ENCOUNTER — Ambulatory Visit (INDEPENDENT_AMBULATORY_CARE_PROVIDER_SITE_OTHER): Payer: Self-pay | Admitting: Bariatrics

## 2018-01-15 ENCOUNTER — Ambulatory Visit: Payer: 59 | Admitting: Psychology

## 2018-02-05 ENCOUNTER — Ambulatory Visit (INDEPENDENT_AMBULATORY_CARE_PROVIDER_SITE_OTHER): Payer: 59 | Admitting: Psychology

## 2018-02-05 DIAGNOSIS — F4323 Adjustment disorder with mixed anxiety and depressed mood: Secondary | ICD-10-CM | POA: Diagnosis not present

## 2018-02-06 ENCOUNTER — Encounter: Payer: Self-pay | Admitting: Family Medicine

## 2018-02-06 ENCOUNTER — Ambulatory Visit (INDEPENDENT_AMBULATORY_CARE_PROVIDER_SITE_OTHER): Payer: 59 | Admitting: Family Medicine

## 2018-02-06 VITALS — BP 128/90 | HR 81 | Temp 97.9°F | Ht 65.0 in | Wt 288.8 lb

## 2018-02-06 DIAGNOSIS — R002 Palpitations: Secondary | ICD-10-CM

## 2018-02-06 DIAGNOSIS — E669 Obesity, unspecified: Secondary | ICD-10-CM

## 2018-02-06 DIAGNOSIS — I10 Essential (primary) hypertension: Secondary | ICD-10-CM | POA: Diagnosis not present

## 2018-02-06 DIAGNOSIS — F32A Depression, unspecified: Secondary | ICD-10-CM

## 2018-02-06 DIAGNOSIS — E1169 Type 2 diabetes mellitus with other specified complication: Secondary | ICD-10-CM

## 2018-02-06 DIAGNOSIS — F419 Anxiety disorder, unspecified: Secondary | ICD-10-CM | POA: Diagnosis not present

## 2018-02-06 DIAGNOSIS — Z23 Encounter for immunization: Secondary | ICD-10-CM

## 2018-02-06 DIAGNOSIS — F329 Major depressive disorder, single episode, unspecified: Secondary | ICD-10-CM

## 2018-02-06 NOTE — Patient Instructions (Signed)
Nice to see you. We will check lab work today and contact you with results. Please start exercising to monitor your anxiety.  If it worsens please let us know.  Please continue to see the therapist.

## 2018-02-06 NOTE — Progress Notes (Signed)
  Tommi Rumps, MD Phone: [phone]  Veronica Norton is a 42 y.o. female who presents today for f/u.  CC: palpitations, anxiety/depression, dm, HTN  Palpitations: Patient notes recently she has noticed intermittent fluttering in her chest.  She notes when this occurs she will feel little short of breath.  Oftentimes occurs when she is just sitting there.  She will close her eyes and try to relax and it will go away.  She notes no chest pain.  She does note this will feel better after exercising.  Seems to be associated with her anxiety.  Anxiety: Patient does note some anxiety.  No depression.  She has been seeing her therapist which is quite helpful.  Most of her anxiety comes from her relationship and a change in how she has been paid through work.  Diabetes: She is not checking sugars.  She is taking Victoza and metformin.  No polyuria or polydipsia.  No hypoglycemia.  She reports she saw ophthalmology over the summer.  Hypertension: Patient is taking her HCTZ.  Patient reports Pap smear in June through gynecology.  Social History   Tobacco Use  Smoking Status Never Smoker  Smokeless Tobacco Never Used     ROS see history of present illness  Objective  Physical Exam Vitals:   02/06/18 1442  BP: 128/90  Pulse: 81  Temp: 97.9 F (36.6 C)  SpO2: 100%    BP Readings from Last 3 Encounters:  02/06/18 128/90  10/10/17 128/78  07/03/17 120/80   Wt Readings from Last 3 Encounters:  02/06/18 288 lb 12.8 oz (131 kg)  10/10/17 282 lb (127.9 kg)  07/03/17 279 lb 3.2 oz (126.6 kg)    Physical Exam  Constitutional: No distress.  Cardiovascular: Normal rate, regular rhythm and normal heart sounds.  Pulmonary/Chest: Effort normal and breath sounds normal.  Musculoskeletal: She exhibits no edema.  Neurological: She is alert.  Skin: Skin is warm and dry. She is not diaphoretic.   EKG: Normal sinus rhythm, diffuse nonspecific T wave abnormality stable from prior, rate  71  Assessment/Plan: Please see individual problem list.  Hypertension Slight diastolic elevation today.  Had been well controlled.  She will continue her current regimen.  She will work on exercise.  Diabetes mellitus type 2 in obese Check A1c.  Continue current regimen.  Anxiety and depression Continues to have issues with anxiety.  Therapy has been quite beneficial.  Discussed medication though we opted to have her start exercising as that has been beneficial thus far.  I believe her anxiety is the cause of her palpitations particularly given that they improve with exercise.  Palpitations This appears to be related to her anxiety.  EKG is reassuring.  We will check lab work for possible other causes.  Treat anxiety per above.   Orders Placed This Encounter  Procedures  . Flu Vaccine QUAD 6+ mos PF IM (Fluarix Quad PF)  . Tdap vaccine greater than or equal to 7yo IM  . Basic Metabolic Panel (BMET)  . CBC  . TSH  . HgB A1c  . EKG 12-Lead    No orders of the defined types were placed in this encounter.    Tommi Rumps, MD [place]

## 2018-02-07 DIAGNOSIS — R002 Palpitations: Secondary | ICD-10-CM | POA: Insufficient documentation

## 2018-02-07 LAB — CBC
HEMATOCRIT: 42.9 % (ref 35.0–45.0)
Hemoglobin: 15 g/dL (ref 11.7–15.5)
MCH: 33.6 pg — ABNORMAL HIGH (ref 27.0–33.0)
MCHC: 35 g/dL (ref 32.0–36.0)
MCV: 96.2 fL (ref 80.0–100.0)
MPV: 9.6 fL (ref 7.5–12.5)
Platelets: 398 10*3/uL (ref 140–400)
RBC: 4.46 10*6/uL (ref 3.80–5.10)
RDW: 12.1 % (ref 11.0–15.0)
WBC: 8.2 10*3/uL (ref 3.8–10.8)

## 2018-02-07 LAB — BASIC METABOLIC PANEL
BUN: 9 mg/dL (ref 7–25)
CALCIUM: 9.4 mg/dL (ref 8.6–10.2)
CO2: 27 mmol/L (ref 20–32)
Chloride: 98 mmol/L (ref 98–110)
Creat: 0.67 mg/dL (ref 0.50–1.10)
GLUCOSE: 129 mg/dL — AB (ref 65–99)
Potassium: 4.3 mmol/L (ref 3.5–5.3)
Sodium: 133 mmol/L — ABNORMAL LOW (ref 135–146)

## 2018-02-07 LAB — TSH: TSH: 1.74 mIU/L

## 2018-02-07 LAB — HEMOGLOBIN A1C
EAG (MMOL/L): 11.9 (calc)
HEMOGLOBIN A1C: 9.1 %{Hb} — AB (ref <5.7)
MEAN PLASMA GLUCOSE: 214 (calc)

## 2018-02-07 NOTE — Assessment & Plan Note (Signed)
This appears to be related to her anxiety.  EKG is reassuring.  We will check lab work for possible other causes.  Treat anxiety per above.

## 2018-02-07 NOTE — Assessment & Plan Note (Signed)
Continues to have issues with anxiety.  Therapy has been quite beneficial.  Discussed medication though we opted to have her start exercising as that has been beneficial thus far.  I believe her anxiety is the cause of her palpitations particularly given that they improve with exercise.

## 2018-02-07 NOTE — Assessment & Plan Note (Signed)
Slight diastolic elevation today.  Had been well controlled.  She will continue her current regimen.  She will work on exercise.

## 2018-02-07 NOTE — Assessment & Plan Note (Signed)
Check A1c.  Continue current regimen. 

## 2018-02-08 ENCOUNTER — Other Ambulatory Visit: Payer: Self-pay | Admitting: Family Medicine

## 2018-02-08 DIAGNOSIS — E871 Hypo-osmolality and hyponatremia: Secondary | ICD-10-CM

## 2018-02-21 ENCOUNTER — Other Ambulatory Visit (INDEPENDENT_AMBULATORY_CARE_PROVIDER_SITE_OTHER): Payer: 59

## 2018-02-21 DIAGNOSIS — E871 Hypo-osmolality and hyponatremia: Secondary | ICD-10-CM

## 2018-02-21 LAB — BASIC METABOLIC PANEL
BUN: 10 mg/dL (ref 6–23)
CO2: 28 meq/L (ref 19–32)
CREATININE: 0.72 mg/dL (ref 0.40–1.20)
Calcium: 8.5 mg/dL (ref 8.4–10.5)
Chloride: 102 mEq/L (ref 96–112)
GFR: 114.37 mL/min (ref >60.00)
Glucose, Bld: 179 mg/dL — ABNORMAL HIGH (ref 70–99)
Potassium: 4 mEq/L (ref 3.5–5.1)
Sodium: 135 mEq/L (ref 135–145)

## 2018-02-28 ENCOUNTER — Ambulatory Visit (INDEPENDENT_AMBULATORY_CARE_PROVIDER_SITE_OTHER): Payer: 59 | Admitting: Psychology

## 2018-02-28 DIAGNOSIS — F4323 Adjustment disorder with mixed anxiety and depressed mood: Secondary | ICD-10-CM

## 2018-03-14 DIAGNOSIS — L700 Acne vulgaris: Secondary | ICD-10-CM | POA: Diagnosis not present

## 2018-03-14 DIAGNOSIS — B078 Other viral warts: Secondary | ICD-10-CM | POA: Diagnosis not present

## 2018-03-14 DIAGNOSIS — L821 Other seborrheic keratosis: Secondary | ICD-10-CM | POA: Diagnosis not present

## 2018-03-28 DIAGNOSIS — Z79899 Other long term (current) drug therapy: Secondary | ICD-10-CM | POA: Diagnosis not present

## 2018-03-28 DIAGNOSIS — L700 Acne vulgaris: Secondary | ICD-10-CM | POA: Diagnosis not present

## 2018-04-01 ENCOUNTER — Ambulatory Visit (INDEPENDENT_AMBULATORY_CARE_PROVIDER_SITE_OTHER): Payer: 59 | Admitting: Psychology

## 2018-04-01 DIAGNOSIS — F4323 Adjustment disorder with mixed anxiety and depressed mood: Secondary | ICD-10-CM | POA: Diagnosis not present

## 2018-04-11 DIAGNOSIS — D485 Neoplasm of uncertain behavior of skin: Secondary | ICD-10-CM | POA: Diagnosis not present

## 2018-04-11 DIAGNOSIS — L700 Acne vulgaris: Secondary | ICD-10-CM | POA: Diagnosis not present

## 2018-04-11 DIAGNOSIS — B078 Other viral warts: Secondary | ICD-10-CM | POA: Diagnosis not present

## 2018-05-02 ENCOUNTER — Ambulatory Visit: Payer: 59 | Admitting: Psychology

## 2018-05-22 ENCOUNTER — Other Ambulatory Visit: Payer: Self-pay

## 2018-05-22 ENCOUNTER — Encounter: Payer: Self-pay | Admitting: Family Medicine

## 2018-05-22 ENCOUNTER — Ambulatory Visit (INDEPENDENT_AMBULATORY_CARE_PROVIDER_SITE_OTHER): Payer: 59 | Admitting: Family Medicine

## 2018-05-22 VITALS — BP 124/80 | Ht 65.0 in | Wt 287.8 lb

## 2018-05-22 DIAGNOSIS — J069 Acute upper respiratory infection, unspecified: Secondary | ICD-10-CM

## 2018-05-22 DIAGNOSIS — F329 Major depressive disorder, single episode, unspecified: Secondary | ICD-10-CM

## 2018-05-22 DIAGNOSIS — F419 Anxiety disorder, unspecified: Secondary | ICD-10-CM

## 2018-05-22 DIAGNOSIS — F32A Depression, unspecified: Secondary | ICD-10-CM

## 2018-05-22 DIAGNOSIS — K219 Gastro-esophageal reflux disease without esophagitis: Secondary | ICD-10-CM

## 2018-05-22 DIAGNOSIS — I10 Essential (primary) hypertension: Secondary | ICD-10-CM | POA: Diagnosis not present

## 2018-05-22 DIAGNOSIS — E669 Obesity, unspecified: Secondary | ICD-10-CM

## 2018-05-22 DIAGNOSIS — E1169 Type 2 diabetes mellitus with other specified complication: Secondary | ICD-10-CM

## 2018-05-22 LAB — POCT GLYCOSYLATED HEMOGLOBIN (HGB A1C): Hemoglobin A1C: 7.8 % — AB (ref 4.0–5.6)

## 2018-05-22 NOTE — Patient Instructions (Signed)
Nice to see you. If you have recurrent reflux issues you should contact us and could try over-the-counter Nexium.  If you develop chest pain, shortness of breath, sweating with it, radiation of the pain, or any new symptoms or changing symptoms please seek medical attention immediately. Please try Flonase or Claritin for your sinus symptoms.  If you develop fever please do not go to work.

## 2018-05-23 ENCOUNTER — Ambulatory Visit: Payer: Self-pay | Admitting: Psychology

## 2018-05-26 DIAGNOSIS — K219 Gastro-esophageal reflux disease without esophagitis: Secondary | ICD-10-CM | POA: Insufficient documentation

## 2018-05-26 NOTE — Progress Notes (Signed)
Tommi Rumps, MD Phone: [phone]  Veronica Norton is a 43 y.o. female who presents today for follow-up.  Diabetes: Not checking sugars.  Taking metformin and Victoza.  No polyuria or polydipsia.  No hypoglycemia.  Hypertension: Not checking at home.  Taking HCTZ.  No chest pressure, shortness of breath, or edema.  URI: Patient notes sinus drainage and congestion over the last 4 days.  Clear mucus out of her nose.  She has had some sneezing.  No fevers.  She has not traveled outside of [place].  She has no coronavirus exposure.  Anxiety/depression: She notes no anxiety or depression.  She has continued therapy and exercise.  She is not in a relationship anymore and notes this has been significantly helpful.  Reflux: Patient notes when she was at a conference in [place] at [employer] last week she was eating lots of greasy food and noted some reflux in the back of her throat with some discomfort consistent with prior episodes of reflux.  She cut down on the greasy food and took an acid reliever and has improved.  She did have some abdominal discomfort that occurred prior to a bowel movement though resolved with a bowel movement.  She notes no blood in her stool.  She notes she took an acid reliever to help prevent her symptoms and that has helped..  Social History   Tobacco Use  Smoking Status Never Smoker  Smokeless Tobacco Never Used     ROS see history of present illness  Objective  Physical Exam Vitals:   05/22/18 1605  BP: 124/80    BP Readings from Last 3 Encounters:  05/22/18 124/80  02/06/18 128/90  10/10/17 128/78   Wt Readings from Last 3 Encounters:  05/22/18 287 lb 12.8 oz (130.5 kg)  02/06/18 288 lb 12.8 oz (131 kg)  10/10/17 282 lb (127.9 kg)    Physical Exam Constitutional:      General: She is not in acute distress.    Appearance: She is not diaphoretic.  Cardiovascular:     Rate and Rhythm: Normal rate and regular rhythm.     Heart  sounds: Normal heart sounds.  Pulmonary:     Effort: Pulmonary effort is normal.     Breath sounds: Normal breath sounds.  Abdominal:     General: Bowel sounds are normal. There is no distension.     Palpations: Abdomen is soft.     Tenderness: There is no abdominal tenderness. There is no guarding or rebound.  Musculoskeletal:     Right lower leg: No edema.     Left lower leg: No edema.  Skin:    General: Skin is warm and dry.  Neurological:     Mental Status: She is alert.      Assessment/Plan: Please see individual problem list.  Hypertension Adequately controlled.  Continue current regimen.  Diabetes mellitus type 2 in obese Improving control.  Discussed continuing with her current regimen and increasing dietary changes and exercise.  Plan to recheck at next visit and if not continuing to improve would consider increasing Victoza.  Anxiety and depression Much improved.  She will continue to monitor.  URI (upper respiratory infection) Symptoms consistent with viral URI versus allergic rhinitis.  She will monitor her symptoms and if not improving be reevaluated.  Discussed supportive care.  GERD (gastroesophageal reflux disease) Symptoms likely related to GERD.  They have improved at this time.  Discussed foods to avoid.  If this becomes a recurrent  issue could consider adding a PPI for a short course.    Orders Placed This Encounter  Procedures  . POCT HgB A1C    No orders of the defined types were placed in this encounter.    Tommi Rumps, MD [place]

## 2018-05-26 NOTE — Assessment & Plan Note (Signed)
Much improved.  She will continue to monitor.

## 2018-05-26 NOTE — Assessment & Plan Note (Addendum)
Symptoms consistent with viral URI versus allergic rhinitis.  She will monitor her symptoms and if not improving be reevaluated.  Discussed supportive care.

## 2018-05-26 NOTE — Assessment & Plan Note (Signed)
Improving control.  Discussed continuing with her current regimen and increasing dietary changes and exercise.  Plan to recheck at next visit and if not continuing to improve would consider increasing Victoza.

## 2018-05-26 NOTE — Assessment & Plan Note (Signed)
Symptoms likely related to GERD.  They have improved at this time.  Discussed foods to avoid.  If this becomes a recurrent issue could consider adding a PPI for a short course.

## 2018-05-26 NOTE — Assessment & Plan Note (Signed)
Adequately controlled.  Continue current regimen. 

## 2018-06-22 ENCOUNTER — Other Ambulatory Visit: Payer: Self-pay | Admitting: Family Medicine

## 2018-07-06 ENCOUNTER — Other Ambulatory Visit: Payer: Self-pay | Admitting: Family Medicine

## 2018-08-01 DIAGNOSIS — L700 Acne vulgaris: Secondary | ICD-10-CM | POA: Diagnosis not present

## 2018-08-01 DIAGNOSIS — Z79899 Other long term (current) drug therapy: Secondary | ICD-10-CM | POA: Diagnosis not present

## 2018-08-01 DIAGNOSIS — L905 Scar conditions and fibrosis of skin: Secondary | ICD-10-CM | POA: Diagnosis not present

## 2018-09-23 ENCOUNTER — Ambulatory Visit (INDEPENDENT_AMBULATORY_CARE_PROVIDER_SITE_OTHER): Payer: 59 | Admitting: Family Medicine

## 2018-09-23 ENCOUNTER — Other Ambulatory Visit: Payer: Self-pay

## 2018-09-23 DIAGNOSIS — E669 Obesity, unspecified: Secondary | ICD-10-CM | POA: Diagnosis not present

## 2018-09-23 DIAGNOSIS — E1169 Type 2 diabetes mellitus with other specified complication: Secondary | ICD-10-CM | POA: Diagnosis not present

## 2018-09-23 DIAGNOSIS — I10 Essential (primary) hypertension: Secondary | ICD-10-CM | POA: Diagnosis not present

## 2018-09-23 NOTE — Progress Notes (Signed)
Virtual Visit via video Note  This visit type was conducted due to national recommendations for restrictions regarding the COVID-19 pandemic (e.g. social distancing).  This format is felt to be most appropriate for this patient at this time.  All issues noted in this document were discussed and addressed.  No physical exam was performed (except for noted visual exam findings with Video Visits).   I connected with Veronica Norton today at  3:30 PM EDT by a video enabled telemedicine application and verified that I am speaking with the correct person using two identifiers. Location patient: home Location provider: work  Persons participating in the virtual visit: patient, provider  I discussed the limitations, risks, security and privacy concerns of performing an evaluation and management service by telephone and the availability of in person appointments. I also discussed with the patient that there may be a patient responsible charge related to this service. The patient expressed understanding and agreed to proceed.   Reason for visit: Follow-up.  HPI:  Hypertension: Not checking at home.  Taking HCTZ.  No chest pain, shortness of breath, or edema.  Diabetes: Not checking blood sugars.  Taking metformin and Victoza.  She has a hard time consistently taking her Victoza each day.  No polyuria or polydipsia.  She has had 2 hypoglycemic episodes since her last visit and it occurred when she did not eat appropriately.  She has an ophthalmology appointment in the next several weeks.  Obesity: She has been working on portion control.  She will typically have [place] sausage and strawberries for breakfast.  She will have chicken noodle soup for lunch.  Dinner varies.  She has skinny pop popcorn for a snack.  She walks daily 30 to 45 minutes.  She does note her clothes are fitting a little bit better.   ROS: See pertinent positives and negatives per HPI.  Past Medical History:  Diagnosis Date  .  Diabetes mellitus   . Hypertension   . Normal cardiac stress test 2009    Past Surgical History:  Procedure Laterality Date  . WISDOM TOOTH EXTRACTION      Family History  Problem Relation Age of Onset  . Heart disease Mother        2005 stent placement  . Diabetes Father   . Stroke Maternal Aunt   . Cancer Maternal Grandfather        prostate  . Heart disease Maternal Grandfather   . Heart disease Maternal Uncle   . Heart disease Maternal Grandmother     SOCIAL HX: Non-smoker.   Current Outpatient Medications:  .  fluticasone (CUTIVATE) 0.05 % cream, APPLY TO AFFECTED AREA UP TO TWICE A DAY AS NEEDED FOR RASH/ITCHING, Disp: 60 g, Rfl: 0 .  glucose blood (FREESTYLE TEST STRIPS) test strip, Check sugar bid, Disp: 100 each, Rfl: 5 .  glucose monitoring kit (FREESTYLE) monitoring kit, Use as directed to check sugars bid, Disp: 1 each, Rfl: 0 .  hydrochlorothiazide (MICROZIDE) 12.5 MG capsule, TAKE 1 CAPSULE (12.5 MG TOTAL) BY MOUTH DAILY., Disp: 90 capsule, Rfl: 1 .  Insulin Pen Needle 31G X 8 MM MISC, 1 application by Does not apply route daily., Disp: 100 each, Rfl: 3 .  Lancets (ONETOUCH ULTRASOFT) lancets, Use as instructed, Disp: 100 each, Rfl: 5 .  metFORMIN (GLUCOPHAGE-XR) 500 MG 24 hr tablet, TAKE 2 TABLETS BY MOUTH EVERY DAY WITH BREAKFAST, Disp: 180 tablet, Rfl: 1 .  norethindrone (CAMILA) 0.35 MG tablet, Take 1 tablet (0.35 mg  total) by mouth daily., Disp: 28 tablet, Rfl: 6 .  VICTOZA 18 MG/3ML SOPN, INJECT 0.1 MLS (0.6 MG TOTAL) INTO THE SKIN DAILY., Disp: 6 pen, Rfl: 2 .  Dulaglutide (TRULICITY) 2.57 KV/3.5LE SOPN, Inject 0.75 mg into the skin once a week., Disp: 12 pen, Rfl: 1  EXAM:  VITALS per patient if applicable: None.  GENERAL: alert, oriented, appears well and in no acute distress  HEENT: atraumatic, conjunttiva clear, no obvious abnormalities on inspection of external nose and ears  NECK: normal movements of the head and neck  LUNGS: on inspection no  signs of respiratory distress, breathing rate appears normal, no obvious gross SOB, gasping or wheezing  CV: no obvious cyanosis  MS: moves all visible extremities without noticeable abnormality  PSYCH/NEURO: pleasant and cooperative, no obvious depression or anxiety, speech and thought processing grossly intact  ASSESSMENT AND PLAN:  Discussed the following assessment and plan:  Hypertension She will come in for a BP check with nursing and weight check.  Continue current medication.  BMP at that time as well.  Diabetes mellitus type 2 in obese We will change the patient from Victoza to Trulicity.  I believe it will be easier for her to remember to take this once weekly then daily.  She will continue metformin.  Severe obesity (BMI >= 40) Encouraged continued diet and exercise.  She may benefit from some weight loss with Trulicity.    Social distancing precautions and sick precautions given regarding COVID-19.  I discussed the assessment and treatment plan with the patient. The patient was provided an opportunity to ask questions and all were answered. The patient agreed with the plan and demonstrated an understanding of the instructions.   The patient was advised to call back or seek an in-person evaluation if the symptoms worsen or if the condition fails to improve as anticipated.    Tommi Rumps, MD

## 2018-09-25 ENCOUNTER — Other Ambulatory Visit: Payer: Self-pay | Admitting: Obstetrics and Gynecology

## 2018-09-25 DIAGNOSIS — R928 Other abnormal and inconclusive findings on diagnostic imaging of breast: Secondary | ICD-10-CM

## 2018-09-27 ENCOUNTER — Encounter: Payer: Self-pay | Admitting: Family Medicine

## 2018-09-27 MED ORDER — TRULICITY 0.75 MG/0.5ML ~~LOC~~ SOAJ: 0.7500 mg | SUBCUTANEOUS | 1 refills | Status: DC

## 2018-09-27 NOTE — Assessment & Plan Note (Signed)
Encouraged continued diet and exercise.  She may benefit from some weight loss with Trulicity.

## 2018-09-27 NOTE — Assessment & Plan Note (Signed)
She will come in for a BP check with nursing and weight check.  Continue current medication.  BMP at that time as well.

## 2018-09-27 NOTE — Assessment & Plan Note (Signed)
We will change the patient from Victoza to Trulicity.  I believe it will be easier for her to remember to take this once weekly then daily.  She will continue metformin.

## 2018-10-03 ENCOUNTER — Ambulatory Visit
Admission: RE | Admit: 2018-10-03 | Discharge: 2018-10-03 | Disposition: A | Discharge status: Home / Self Care | Payer: 59 | Source: Ambulatory Visit | Attending: Obstetrics and Gynecology | Admitting: Obstetrics and Gynecology

## 2018-10-03 ENCOUNTER — Other Ambulatory Visit: Payer: Self-pay

## 2018-10-03 ENCOUNTER — Other Ambulatory Visit: Payer: Self-pay | Admitting: Obstetrics and Gynecology

## 2018-10-03 DIAGNOSIS — R928 Other abnormal and inconclusive findings on diagnostic imaging of breast: Secondary | ICD-10-CM

## 2018-10-03 DIAGNOSIS — N641 Fat necrosis of breast: Secondary | ICD-10-CM

## 2018-11-14 ENCOUNTER — Other Ambulatory Visit: Payer: Self-pay | Admitting: Family Medicine

## 2018-12-25 ENCOUNTER — Other Ambulatory Visit: Payer: Self-pay | Admitting: Family Medicine

## 2019-01-14 ENCOUNTER — Other Ambulatory Visit: Payer: Self-pay

## 2019-01-15 ENCOUNTER — Ambulatory Visit (INDEPENDENT_AMBULATORY_CARE_PROVIDER_SITE_OTHER): Payer: 59 | Admitting: Family Medicine

## 2019-01-15 ENCOUNTER — Encounter: Payer: Self-pay | Admitting: Family Medicine

## 2019-01-15 VITALS — BP 120/80 | HR 92 | Temp 97.8°F | Ht 65.0 in | Wt 292.2 lb

## 2019-01-15 DIAGNOSIS — R928 Other abnormal and inconclusive findings on diagnostic imaging of breast: Secondary | ICD-10-CM | POA: Diagnosis not present

## 2019-01-15 DIAGNOSIS — I10 Essential (primary) hypertension: Secondary | ICD-10-CM

## 2019-01-15 DIAGNOSIS — R040 Epistaxis: Secondary | ICD-10-CM | POA: Diagnosis not present

## 2019-01-15 DIAGNOSIS — E1169 Type 2 diabetes mellitus with other specified complication: Secondary | ICD-10-CM

## 2019-01-15 DIAGNOSIS — E669 Obesity, unspecified: Secondary | ICD-10-CM

## 2019-01-15 NOTE — Patient Instructions (Signed)
Nice to see you. We will get lab work today and contact you with results. Please continue your current medications. If you have recurrent nosebleed please pinch her nose as we discussed.  If it does not stop bleeding within 10 to 15 minutes please seek medical attention. Please make sure you keep your appointment for your follow-up mammogram.

## 2019-01-15 NOTE — Assessment & Plan Note (Signed)
Check CBC.  Discussed nasal saline spray and a humidifier.  She will monitor for recurrence.  She will let us know if it recurs.  Discussed seeking medical attention if she develops bleeding that she cannot get to stop within 10 or 15 minutes.  Discussed appropriate manner for getting the nosebleed to stop.

## 2019-01-15 NOTE — Progress Notes (Signed)
  Tommi Rumps, MD Phone: [phone]  Veronica Norton is a 43 y.o. female who presents today for f/u.  Nosebleed: Patient notes on 2 occasion she has had a nosebleed randomly.  Occurred on 10/20 with moderate bleeding.  She was able to get this to stop and then had another nosebleed with mild bleeding on 11/2.  Has come out of her right nostril.  She notes no bleeding or bruising elsewhere.  Diabetes: Not checking sugars.  She is taking Trulicity which she started this week.  Taking metformin as well.  No polyuria or polydipsia.  She reports she is up-to-date on ophthalmology.  Hypertension: Not checking blood pressures.  Taking HCTZ.  No chest pain or shortness of breath.  Abnormal mammogram: Patient reports this was completed through her gynecologist.  She had a abnormal mammogram followed by diagnostic mammogram and ultrasound.  She has follow-up imaging scheduled in January.  Social History   Tobacco Use  Smoking Status Never Smoker  Smokeless Tobacco Never Used     ROS see history of present illness  Objective  Physical Exam Vitals:   01/15/19 1604 01/15/19 1652  BP: 140/80 120/80  Pulse: 92   Temp: 97.8 F (36.6 C)   SpO2: 99%     BP Readings from Last 3 Encounters:  01/15/19 120/80  05/22/18 124/80  02/06/18 128/90   Wt Readings from Last 3 Encounters:  01/15/19 292 lb 3.2 oz (132.5 kg)  05/22/18 287 lb 12.8 oz (130.5 kg)  02/06/18 288 lb 12.8 oz (131 kg)    Physical Exam Constitutional:      General: She is not in acute distress.    Appearance: She is not diaphoretic.  HENT:     Nose:     Comments: Mild irritation right nasal mucosa though no focal area of bleeding, normal left nasal mucosa Cardiovascular:     Rate and Rhythm: Normal rate and regular rhythm.     Heart sounds: Normal heart sounds.  Pulmonary:     Effort: Pulmonary effort is normal.     Breath sounds: Normal breath sounds.  Musculoskeletal:     Right lower leg: No edema.    Left lower leg: No edema.  Skin:    General: Skin is warm and dry.  Neurological:     Mental Status: She is alert.      Assessment/Plan: Please see individual problem list.  Hypertension Well-controlled on recheck.  Continue current regimen.  Check lab work.  Diabetes mellitus type 2 in obese Check A1c.  She will continue Trulicity and Metformin.  Bleeding nose Check CBC.  Discussed nasal saline spray and a humidifier.  She will monitor for recurrence.  She will let us know if it recurs.  Discussed seeking medical attention if she develops bleeding that she cannot get to stop within 10 or 15 minutes.  Discussed appropriate manner for getting the nosebleed to stop.  Abnormal mammogram I encouraged her to keep her already scheduled appointment for follow-up mammogram.    Orders Placed This Encounter  Procedures  . Lipid panel  . Comp Met (CMET)  . HgB A1c  . Urine Microalbumin w/creat. ratio  . CBC    No orders of the defined types were placed in this encounter.    Tommi Rumps, MD [place]

## 2019-01-15 NOTE — Assessment & Plan Note (Signed)
Check A1c.  She will continue Trulicity and Metformin.

## 2019-01-15 NOTE — Assessment & Plan Note (Signed)
Well-controlled on recheck.  Continue current regimen.  Check lab work.

## 2019-01-15 NOTE — Assessment & Plan Note (Signed)
I encouraged her to keep her already scheduled appointment for follow-up mammogram.

## 2019-01-16 LAB — MICROALBUMIN / CREATININE URINE RATIO
Creatinine,U: 48.4 mg/dL
Microalb Creat Ratio: 1.6 mg/g (ref 0.0–30.0)
Microalb, Ur: 0.8 mg/dL (ref 0.0–1.9)

## 2019-01-16 LAB — COMPREHENSIVE METABOLIC PANEL
ALT: 17 U/L (ref 0–35)
AST: 14 U/L (ref 0–37)
Albumin: 3.8 g/dL (ref 3.5–5.2)
Alkaline Phosphatase: 72 U/L (ref 39–117)
BUN: 12 mg/dL (ref 6–23)
CO2: 28 mEq/L (ref 19–32)
Calcium: 8.9 mg/dL (ref 8.4–10.5)
Chloride: 97 mEq/L (ref 96–112)
Creatinine, Ser: 0.78 mg/dL (ref 0.40–1.20)
GFR: 97.69 mL/min (ref >60.00)
Glucose, Bld: 186 mg/dL — ABNORMAL HIGH (ref 70–99)
Potassium: 3.8 mEq/L (ref 3.5–5.1)
Sodium: 132 mEq/L — ABNORMAL LOW (ref 135–145)
Total Bilirubin: 0.2 mg/dL (ref 0.2–1.2)
Total Protein: 7.1 g/dL (ref 6.0–8.3)

## 2019-01-16 LAB — LIPID PANEL
Cholesterol: 139 mg/dL (ref 0–200)
HDL: 42.4 mg/dL (ref >39.00)
LDL Cholesterol: 74 mg/dL (ref 0–99)
NonHDL: 97
Total CHOL/HDL Ratio: 3
Triglycerides: 115 mg/dL (ref 0.0–149.0)
VLDL: 23 mg/dL (ref 0.0–40.0)

## 2019-01-16 LAB — CBC
HCT: 44.9 % (ref 36.0–46.0)
Hemoglobin: 15.2 g/dL — ABNORMAL HIGH (ref 12.0–15.0)
MCHC: 33.9 g/dL (ref 30.0–36.0)
MCV: 97.2 fl (ref 78.0–100.0)
Platelets: 360 10*3/uL (ref 150.0–400.0)
RBC: 4.62 Mil/uL (ref 3.87–5.11)
RDW: 12.9 % (ref 11.5–15.5)
WBC: 7.8 10*3/uL (ref 4.0–10.5)

## 2019-01-16 LAB — HEMOGLOBIN A1C: Hgb A1c MFr Bld: 10.9 % — ABNORMAL HIGH (ref 4.6–6.5)

## 2019-01-17 ENCOUNTER — Other Ambulatory Visit: Payer: Self-pay | Admitting: Family Medicine

## 2019-01-17 DIAGNOSIS — E1169 Type 2 diabetes mellitus with other specified complication: Secondary | ICD-10-CM

## 2019-01-17 DIAGNOSIS — E669 Obesity, unspecified: Secondary | ICD-10-CM

## 2019-01-17 MED ORDER — BLOOD GLUCOSE MONITOR KIT: PACK | 0 refills | Status: DC

## 2019-01-20 ENCOUNTER — Telehealth: Payer: Self-pay | Admitting: Family Medicine

## 2019-01-20 NOTE — Progress Notes (Signed)
erronous encounter

## 2019-01-20 NOTE — Chronic Care Management (AMB) (Signed)
  Chronic Care Management   Outreach Note  01/20/2019 Name: Veronica Norton MRN: [id number] DOB: 02/01/1976  Veronica Norton is a 43 y.o. year old female who is a primary care patient of Caryl Bis, Angela Adam, MD. I reached out to [place] by phone today in response to a referral sent by Veronica Norton's PCP, Dr. Tommi Rumps     An unsuccessful telephone outreach was attempted today. The patient was referred to the case management team by for assistance with care management and care coordination.   Follow Up Plan: A HIPPA compliant phone message was left for the patient providing contact information and requesting a return call.  The care management team will reach out to the patient again over the next 7 days.  If patient returns call to provider office, please advise to call [place] Glenna Durand LPN at [phone]  Nickeah Allen LPN Nurse Health Advisor . [place]  [email or web address] ??[phone]

## 2019-01-20 NOTE — Telephone Encounter (Signed)
Patient is returning a call to the nurse.  Please advise and call her back.

## 2019-01-20 NOTE — Chronic Care Management (AMB) (Signed)
  Care Management   Note  01/20/2019 Name: Syd Beare MRN: [id number] DOB: [birth year]  Paulette Blanch Ohlin is a 43 y.o. year old female who is a primary care patient of Caryl Bis, Angela Adam, MD. I reached out to [place] by phone today in response to a referral sent by Ms. Paulette Blanch Brizuela's health plan.    Ms. Holz was given information about care management services today including:  1. Care management services include personalized support from designated clinical staff supervised by her physician, including individualized plan of care and coordination with other care providers 2. 24/7 contact phone numbers for assistance for urgent and routine care needs. 3. The patient may stop care management services at any time by phone call to the office staff.  Patient agreed to services and verbal consent obtained.   Follow up plan: Telephone appointment with CCM team member scheduled for:02/10/2019  [place] . [place]  [email or web address] ??[phone]

## 2019-02-03 ENCOUNTER — Ambulatory Visit (INDEPENDENT_AMBULATORY_CARE_PROVIDER_SITE_OTHER): Payer: 59 | Admitting: Family Medicine

## 2019-02-03 ENCOUNTER — Other Ambulatory Visit: Payer: Self-pay

## 2019-02-03 ENCOUNTER — Encounter (INDEPENDENT_AMBULATORY_CARE_PROVIDER_SITE_OTHER): Payer: Self-pay | Admitting: Family Medicine

## 2019-02-03 VITALS — BP 120/83 | HR 82 | Temp 98.1°F | Ht 65.0 in | Wt 284.0 lb

## 2019-02-03 DIAGNOSIS — I10 Essential (primary) hypertension: Secondary | ICD-10-CM

## 2019-02-03 DIAGNOSIS — F3289 Other specified depressive episodes: Secondary | ICD-10-CM | POA: Diagnosis not present

## 2019-02-03 DIAGNOSIS — R0602 Shortness of breath: Secondary | ICD-10-CM | POA: Diagnosis not present

## 2019-02-03 DIAGNOSIS — G4730 Sleep apnea, unspecified: Secondary | ICD-10-CM | POA: Diagnosis not present

## 2019-02-03 DIAGNOSIS — Z6841 Body Mass Index (BMI) 40.0 and over, adult: Secondary | ICD-10-CM

## 2019-02-03 DIAGNOSIS — R5383 Other fatigue: Secondary | ICD-10-CM

## 2019-02-03 DIAGNOSIS — E119 Type 2 diabetes mellitus without complications: Secondary | ICD-10-CM

## 2019-02-03 DIAGNOSIS — Z9189 Other specified personal risk factors, not elsewhere classified: Secondary | ICD-10-CM

## 2019-02-03 DIAGNOSIS — E559 Vitamin D deficiency, unspecified: Secondary | ICD-10-CM

## 2019-02-03 DIAGNOSIS — Z0289 Encounter for other administrative examinations: Secondary | ICD-10-CM

## 2019-02-03 MED ORDER — TRULICITY 1.5 MG/0.5ML ~~LOC~~ SOAJ: 1.5000 mg | SUBCUTANEOUS | 0 refills | Status: DC

## 2019-02-04 ENCOUNTER — Encounter (INDEPENDENT_AMBULATORY_CARE_PROVIDER_SITE_OTHER): Payer: Self-pay | Admitting: Family Medicine

## 2019-02-04 LAB — VITAMIN D 25 HYDROXY (VIT D DEFICIENCY, FRACTURES): Vit D, 25-Hydroxy: 28.2 ng/mL — ABNORMAL LOW (ref 30.0–100.0)

## 2019-02-04 LAB — VITAMIN B12: Vitamin B-12: 620 pg/mL (ref 232–1245)

## 2019-02-04 LAB — T3: T3, Total: 107 ng/dL (ref 71–180)

## 2019-02-04 LAB — T4, FREE: Free T4: 1.09 ng/dL (ref 0.82–1.77)

## 2019-02-04 LAB — TSH: TSH: 1.48 u[IU]/mL (ref 0.450–4.500)

## 2019-02-04 NOTE — Progress Notes (Signed)
Office: [phone]  /  Fax: [phone]   Dear Dr. Garwin Norton,   Thank you for referring Veronica Norton to our clinic. The following note includes my evaluation and treatment recommendations.  HPI:   Chief Complaint: OBESITY    Veronica Norton has been referred by Veronica Salina, MD for consultation regarding her obesity and obesity related comorbidities.    Veronica Norton (MR# [id number]) is a 43 y.o. female who presents on 02/03/2019 for obesity evaluation and treatment. Current BMI is Body mass index is 47.26 kg/m. Veronica Norton has been struggling with her weight for many years and has been unsuccessful in either losing weight, maintaining weight loss, or reaching her healthy weight goal.     Veronica Norton states she is currently in the action stage of change and ready to dedicate time achieving and maintaining a healthier weight. Veronica Norton is interested in becoming our patient and working on intensive lifestyle modifications including (but not limited to) diet, exercise and weight loss.    Veronica Norton states her family eats meals together she thinks her family will eat healthier with  her her desired weight loss is 84 lbs she has been heavy most of  her life she started gaining weight over the years with stress, and COVID she is a picky eater and doesn't like to eat healthier foods  she has significant food cravings issues  she snacks frequently in the evenings she skips meals frequently she is frequently drinking liquids with calories she frequently makes poor food choices she frequently eats larger portions than normal  she struggles with emotional eating    Fatigue Veronica Norton feels her energy is lower than it should be. This has worsened with weight gain and has not worsened recently. Veronica Norton admits to daytime somnolence and admits to waking up refreshed and waking up still tired. Patient is at risk for obstructive sleep apnea. Veronica Norton has a history of symptoms of daytime fatigue.  Patient generally gets 6-8 hours of sleep per night, and states they generally have generally restful sleep. Snoring is present. Apneic episodes are not present. Epworth Sleepiness Score is 6.  Dyspnea on exertion Veronica Norton notes increasing shortness of breath with exercising and seems to be worsening over time with weight gain. She notes getting out of breath sooner with activity than she used to. This has not gotten worse recently. Veronica Norton denies orthopnea.  Diabetes II Veronica Norton has a diagnosis of diabetes type II (uncontrolled). Veronica Norton's last A1c was 10.9 on 01/15/2019. She is taking Trulicity, and metformin prescribed by her primary care physician. She denies hypoglycemia. She has been working on intensive lifestyle modifications including diet, exercise, and weight loss to help control her blood glucose levels.  Hypertension Veronica Norton is a 43 y.o. female with hypertension. Veronica Norton's blood pressure is at goal. She is working on weight loss to help control her blood pressure with the goal of decreasing her risk of heart attack and stroke. Veronica Norton's blood pressure is currently controlled.  At risk for cardiovascular disease Veronica Norton is at a higher than average risk for cardiovascular disease due to obesity, diabetes II, and hypertension. She currently denies any chest pain.  Sleep Disorder Breathing Veronica Norton has sleep disorder breathing. Her epworth score is 6, and she notes snoring.  Depression with Emotional Eating Behaviors Veronica Norton is struggling with emotional eating and using food for comfort to the extent that it is negatively impacting her health. She often snacks when she is not hungry. Veronica Norton sometimes feels she is out of control and  then feels guilty that she made poor food choices. She has been working on behavior modification techniques to help reduce her emotional eating and has been somewhat successful. She shows no sign of suicidal or homicidal ideations.  Depression Screen Veronica Norton's Food and  Mood (modified PHQ-9) score was  Depression screen PHQ 2/9 02/03/2019  Decreased Interest 1  Down, Depressed, Hopeless 1  PHQ - 2 Score 2  Altered sleeping 0  Tired, decreased energy 1  Change in appetite 2  Feeling bad or failure about yourself  1  Trouble concentrating 0  Moving slowly or fidgety/restless 1  Suicidal thoughts 0  PHQ-9 Score 7  Difficult doing work/chores Not difficult at all    ASSESSMENT AND PLAN:  Other fatigue - Plan: EKG 12-Lead, T3, T4, free, TSH, B12, Vitamin D (25 hydroxy)  Shortness of breath on exertion  Essential hypertension  Sleep disorder breathing  Other depression, emotional eating  Type 2 diabetes mellitus without complication, without long-term current use of insulin (HCC) - Plan: Dulaglutide (TRULICITY) 1.5 JF/3.5KT SOPN  At risk for heart disease  Class 3 severe obesity with serious comorbidity and body mass index (BMI) of 45.0 to 49.9 in adult, unspecified obesity type (HCC)  PLAN:  Fatigue Veronica Norton was informed that her fatigue may be related to obesity, depression or many other causes. Labs will be ordered, and in the meanwhile Veronica Norton has agreed to work on diet, exercise and weight loss to help with fatigue. Proper sleep hygiene was discussed including the need for 7-8 hours of quality sleep each night. A sleep study was not ordered based on symptoms and Epworth score.  Dyspnea on exertion Veronica Norton's shortness of breath appears to be obesity related and exercise induced. She has agreed to work on weight loss and gradually increase exercise to treat her exercise induced shortness of breath. If Veronica Norton follows our instructions and loses weight without improvement of her shortness of breath, we will plan to refer to pulmonology. We will monitor this condition regularly. Veronica Norton agrees to this plan.  Diabetes II Veronica Norton has been given extensive diabetes education by myself today including ideal fasting and post-prandial blood glucose readings,  individual ideal Hgb A1c goals and hypoglycemia prevention. We discussed the importance of good blood sugar control to decrease the likelihood of diabetic complications such as nephropathy, neuropathy, limb loss, blindness, coronary artery disease, and death. We discussed the importance of intensive lifestyle modification including diet, exercise and weight loss as the first line treatment for diabetes. Holy agrees to continue her diabetes medications, and she agrees to increase Trulicity to 1.5 mg SubQ weekly #4 pens with no refills. Shakesha agrees to follow up with our clinic in 2 weeks.  Hypertension We discussed sodium restriction, working on healthy weight loss, and a regular exercise program as the means to achieve improved blood pressure control. Veronica Norton agreed with this plan and agreed to follow up as directed. We will continue to monitor her blood pressure as well as her progress with the above lifestyle modifications. Veronica Norton will continue her medications as prescribed and will watch for signs of hypotension as she continues her lifestyle modifications. Veronica Norton agrees to follow up with our clinic in 2 weeks.  Cardiovascular risk counseling Veronica Norton was given extended (15 minutes) coronary artery disease prevention counseling today. She is 43 y.o. female and has risk factors for heart disease including obesity, diabetes II, and hypertension. We discussed intensive lifestyle modifications today with an emphasis on specific weight loss instructions and strategies. Pt was  also informed of the importance of increasing exercise and decreasing saturated fats to help prevent heart disease.  Sleep Disorder Breathing We will continue to monitor. Veronica Norton agrees to follow up with our clinic in 2 weeks.  Depression with Emotional Eating Behaviors We discussed behavior modification techniques today to help Sian deal with her emotional eating and depression.   Depression Screen Veronica Norton had a mildly positive depression  screening. Depression is commonly associated with obesity and often results in emotional eating behaviors. We will monitor this closely and work on CBT to help improve the non-hunger eating patterns. Referral to Psychology may be required if no improvement is seen as she continues in our clinic.  Obesity Veronica Norton is currently in the action stage of change and her goal is to continue with weight loss efforts. I recommend Veronica Norton begin the structured treatment plan as follows:  She has agreed to follow the Category 1 plan Veronica Norton has been instructed to eventually work up to a goal of 150 minutes of combined cardio and strengthening exercise per week for weight loss and overall health benefits. We discussed the following Behavioral Modification Strategies today: increasing lean protein intake, decreasing simple carbohydrates, increasing vegetables, increase H20 intake, work on meal planning and easy cooking plans, holiday eating strategies  and decrease liquid calories   She was informed of the importance of frequent follow up visits to maximize her success with intensive lifestyle modifications for her multiple health conditions. She was informed we would discuss her lab results at her next visit unless there is a critical issue that needs to be addressed sooner. Ailah agreed to keep her next visit at the agreed upon time to discuss these results.  ALLERGIES: No Known Allergies  MEDICATIONS: Current Outpatient Medications on File Prior to Visit  Medication Sig Dispense Refill  . APPLE CIDER VINEGAR PO Take by mouth. 2-3 gummies 2 times per day    . Ascorbic Acid (VITAMIN C ADULT GUMMIES PO) Take by mouth.    . blood glucose meter kit and supplies KIT Dispense based on patient and insurance preference. Use fasting, prior to lunch and prior to dinner. (FOR ICD-10 E11.9) 1 each 0  . ELDERBERRY PO Take by mouth. 1 gummy a day    . fluticasone (CUTIVATE) 0.05 % cream APPLY TO AFFECTED AREA UP TO TWICE A DAY  AS NEEDED FOR RASH/ITCHING 60 g 0  . glucose blood (FREESTYLE TEST STRIPS) test strip Check sugar bid 100 each 5  . glucose monitoring kit (FREESTYLE) monitoring kit Use as directed to check sugars bid 1 each 0  . hydrochlorothiazide (MICROZIDE) 12.5 MG capsule TAKE 1 CAPSULE BY MOUTH EVERY DAY 90 capsule 1  . Insulin Pen Needle 31G X 8 MM MISC 1 application by Does not apply route daily. 100 each 3  . Lancets (ONETOUCH ULTRASOFT) lancets Use as instructed 100 each 5  . metFORMIN (GLUCOPHAGE-XR) 500 MG 24 hr tablet TAKE 2 TABLETS BY MOUTH EVERY DAY WITH BREAKFAST 180 tablet 1  . norethindrone (CAMILA) 0.35 MG tablet Take 1 tablet (0.35 mg total) by mouth daily. 28 tablet 6   No current facility-administered medications on file prior to visit.     PAST MEDICAL HISTORY: Past Medical History:  Diagnosis Date  . Diabetes mellitus   . H/O pilonidal cyst   . Hypertension   . Normal cardiac stress test 2009  . Pre-diabetes     PAST SURGICAL HISTORY: Past Surgical History:  Procedure Laterality Date  . PILONIDAL CYST EXCISION    .  WISDOM TOOTH EXTRACTION      SOCIAL HISTORY: Social History   Tobacco Use  . Smoking status: Never Smoker  . Smokeless tobacco: Never Used  Substance Use Topics  . Alcohol use: Yes    Comment: Occasional  . Drug use: No    FAMILY HISTORY: Family History  Problem Relation Age of Onset  . Heart disease Mother        2005 stent placement  . Hypertension Mother   . Diabetes Father   . Hypertension Father   . Obesity Father   . Stroke Maternal Aunt   . Cancer Maternal Grandfather        prostate  . Heart disease Maternal Grandfather   . Heart disease Maternal Uncle   . Heart disease Maternal Grandmother     ROS: Review of Systems  Constitutional: Positive for malaise/fatigue. Negative for weight loss.       + Trouble sleeping (sometimes)  Eyes:       + Wear glasses or contacts  Respiratory: Positive for shortness of breath (with exertion).    Cardiovascular: Negative for chest pain and orthopnea.  Endo/Heme/Allergies:       Negative hypoglycemia  Psychiatric/Behavioral: Positive for depression. Negative for suicidal ideas.       + Stress    PHYSICAL EXAM: Blood pressure 120/83, pulse 82, temperature 98.1 F (36.7 C), temperature source Oral, height _0  (1.651 m), weight 284 lb (128.8 kg), last menstrual period 01/15/2019, SpO2 95 %. Body mass index is 47.26 kg/m. Physical Exam Vitals signs reviewed.  Constitutional:      Appearance: Normal appearance. She is obese.  HENT:     Head: Normocephalic and atraumatic.     Nose: Nose normal.  Eyes:     General: No scleral icterus.    Extraocular Movements: Extraocular movements intact.  Neck:     Musculoskeletal: Normal range of motion and neck supple.     Comments: No thyromegaly present Cardiovascular:     Rate and Rhythm: Normal rate and regular rhythm.     Pulses: Normal pulses.     Heart sounds: Normal heart sounds.  Pulmonary:     Effort: Pulmonary effort is normal. No respiratory distress.     Breath sounds: Normal breath sounds.  Abdominal:     Palpations: Abdomen is soft.     Tenderness: There is no abdominal tenderness.     Comments: + Obesity  Musculoskeletal: Normal range of motion.     Right lower leg: No edema.     Left lower leg: No edema.  Skin:    General: Skin is warm and dry.  Neurological:     Mental Status: She is alert and oriented to person, place, and time.     Coordination: Coordination normal.  Psychiatric:        Mood and Affect: Mood normal.        Behavior: Behavior normal.     RECENT LABS AND TESTS: BMET    Component Value Date/Time   NA 132 (L) 01/15/2019 1629   K 3.8 01/15/2019 1629   CL 97 01/15/2019 1629   CO2 28 01/15/2019 1629   GLUCOSE 186 (H) 01/15/2019 1629   BUN 12 01/15/2019 1629   CREATININE 0.78 01/15/2019 1629   CREATININE 0.67 02/06/2018 1506   CALCIUM 8.9 01/15/2019 1629   Lab Results  Component  Value Date   HGBA1C 10.9 (H) 01/15/2019   No results found for: INSULIN CBC    Component Value Date/Time  WBC 7.8 01/15/2019 1629   RBC 4.62 01/15/2019 1629   HGB 15.2 (H) 01/15/2019 1629   HCT 44.9 01/15/2019 1629   PLT 360.0 01/15/2019 1629   MCV 97.2 01/15/2019 1629   MCH 33.6 (H) 02/06/2018 1506   MCHC 33.9 01/15/2019 1629   RDW 12.9 01/15/2019 1629   LYMPHSABS 2.1 07/03/2017 1031   MONOABS 0.5 07/03/2017 1031   EOSABS 0.1 07/03/2017 1031   BASOSABS 0.0 07/03/2017 1031   Iron/TIBC/Ferritin/ %Sat No results found for: IRON, TIBC, FERRITIN, IRONPCTSAT Lipid Panel     Component Value Date/Time   CHOL 139 01/15/2019 1629   TRIG 115.0 01/15/2019 1629   HDL 42.40 01/15/2019 1629   CHOLHDL 3 01/15/2019 1629   VLDL 23.0 01/15/2019 1629   LDLCALC 74 01/15/2019 1629   Hepatic Function Panel     Component Value Date/Time   PROT 7.1 01/15/2019 1629   ALBUMIN 3.8 01/15/2019 1629   AST 14 01/15/2019 1629   ALT 17 01/15/2019 1629   ALKPHOS 72 01/15/2019 1629   BILITOT 0.2 01/15/2019 1629      Component Value Date/Time   TSH 1.480 02/03/2019 0904   TSH 1.74 02/06/2018 1506   TSH 1.44 07/03/2017 1031    ECG  shows NSR with a rate of 86 BPM INDIRECT CALORIMETER done today shows a VO2 of 144 and a REE of 999.  Her calculated basal metabolic rate is 8502 thus her basal metabolic rate is worse than expected.       OBESITY BEHAVIORAL INTERVENTION VISIT  Today's visit was # 1   Starting weight: 284 lbs Starting date: 02/03/2019 Today's weight : 284 lbs Today's date: 02/03/2019 Total lbs lost to date: 0    ASK: We discussed the diagnosis of obesity with [place] today and Eyva agreed to give [place] permission to discuss obesity behavioral modification therapy today.  ASSESS: Haniah has the diagnosis of obesity and her BMI today is 47.26 Dimitra is in the action stage of change   ADVISE: Rickelle was educated on the multiple health risks of obesity as well  as the benefit of weight loss to improve her health. She was advised of the need for long term treatment and the importance of lifestyle modifications to improve her current health and to decrease her risk of future health problems.  AGREE: Multiple dietary modification options and treatment options were discussed and  Ruthel agreed to follow the recommendations documented in the above note.  ARRANGE: Libra was educated on the importance of frequent visits to treat obesity as outlined per CMS and USPSTF guidelines and agreed to schedule her next follow up appointment today.  Wilhemena Durie, am acting as transcriptionist for Briscoe Deutscher, DO  I have reviewed the above documentation for accuracy and completeness, and I agree with the above. Briscoe Deutscher, DO

## 2019-02-10 ENCOUNTER — Encounter: Payer: Self-pay | Admitting: Pharmacist

## 2019-02-10 ENCOUNTER — Ambulatory Visit: Payer: 59 | Admitting: Pharmacist

## 2019-02-10 DIAGNOSIS — E119 Type 2 diabetes mellitus without complications: Secondary | ICD-10-CM

## 2019-02-10 NOTE — Patient Instructions (Addendum)
Visit Information  Goals Addressed            This Visit's Progress     Patient Stated   . "I want to get my sugars better under control" (pt-stated)       Current Barriers:  . Diabetes: uncontrolled; most recent A1c 10.9% o Patient recently established w/ Weight Management; Trulicity increased to 1.5 mg weekly o Patient notes that previously, she had been fairly nonadherent to her medication regimen. Notes significant stressors lately related to the  pandemic and finances  . Current antihyperglycemic regimen: metformin 1000 mg BID (though prescription for 1000 mg daily, patient notes she has been taking 2 500 mg tabs w/ lunch and 2 with supper) . Current meal patterns- reviewed extensively w/ Dr. Juleen China last week . Current blood glucose readings: not checking, notes she didn't have the money to pick up her meter yet d/t Trulicity being expensive ($75/month) . Cardiovascular risk reduction: o Current hypertensive regimen: HCTZ 12.5 mg daily;  o Current hyperlipidemia regimen: none; 10 year ASCVD risk 3.8%; however, patient contemplating pregnancy in the future  Pharmacist Clinical Goal(s):  Marland Kitchen Over the next 90 days, patient will work with PharmD and primary care provider to address optimized glycemic management  Interventions: . Comprehensive medication review performed, medication list updated in electronic medical record . Discussed goal A1c, goal fasting glucose and goal 2 hour post prandial glucose. Provided this information to patient via [facility] . Discussed use of copay cards to aid in medication access for Trulicity. Walked patient through the process of downloading a copay card online. She will take to the pharmacy.  . Discussed that she has ~2 boxes of Trulicity [id number] mg at home. Will inject 2 pens per week to use this supply up, then will fill Trulicity 1.5 mg pen strength prescribed by Dr. Juleen China . Updating metformin prescription to reflect current usage.  . Patient's last  Vitamin D was subtherapeutic (checked by Dr. Juleen China). Will discuss potentially adding daily OTC Vit D supplementation w/ Dr. Caryl Bis.   Patient Self Care Activities:  . Patient will check blood glucose daily (alternating between fasting and 2 hour post prandial), document, and provide at future appointments . Patient will take medications as prescribed . Patient will report any questions or concerns to provider   Initial goal documentation        The patient verbalized understanding of instructions provided today and declined a print copy of patient instruction materials.    Plan: - Patient has f/u with Dr. Juleen China w/ weight management in 2 weeks. Scheduled f/u phone visit w/ me in ~5 weeks  Catie Darnelle Maffucci, PharmD, [place], [place] Pharmacist [place] [phone]

## 2019-02-10 NOTE — Chronic Care Management (AMB) (Signed)
Chronic Care Management   Note  02/10/2019 Name: Veronica Norton MRN: [id number] DOB: 12/16/1975   Subjective:  Veronica Norton is a 43 y.o. year old female who is a primary care patient of Caryl Bis, Angela Adam, MD. The CCM team was consulted for assistance with chronic disease management and care coordination needs.    Contacted patient for medication management review today.  Review of patient status, including review of consultants reports, laboratory and other test data, was performed as part of comprehensive evaluation and provision of chronic care management services.   Objective:  Lab Results  Component Value Date   CREATININE 0.78 01/15/2019   CREATININE 0.72 02/21/2018   CREATININE 0.67 02/06/2018    Lab Results  Component Value Date   HGBA1C 10.9 (H) 01/15/2019       Component Value Date/Time   CHOL 139 01/15/2019 1629   TRIG 115.0 01/15/2019 1629   HDL 42.40 01/15/2019 1629   CHOLHDL 3 01/15/2019 1629   VLDL 23.0 01/15/2019 1629   LDLCALC 74 01/15/2019 1629    Clinical ASCVD: No  The 10-year ASCVD risk score Mikey Bussing DC Jr., et al., 2013) is: 3.8%   Values used to calculate the score:     Age: 50 years     Sex: Female     Is Non-Hispanic African American: Yes     Diabetic: Yes     Tobacco smoker: No     Systolic Blood Pressure: 712 mmHg     Is BP treated: Yes     HDL Cholesterol: 42.4 mg/dL     Total Cholesterol: 139 mg/dL    BP Readings from Last 3 Encounters:  02/03/19 120/83  01/15/19 120/80  05/22/18 124/80    No Known Allergies  Medications Reviewed Today    Reviewed by De Hollingshead, [facility] (Pharmacist) on 02/10/19 at 76  Med List Status: <None>  Medication Order Taking? Sig Documenting Provider Last Dose Status Informant  APPLE CIDER VINEGAR PO [id number] Yes Take by mouth. 2-3 gummies 2 times per day [provider] Taking Active   Ascorbic Acid (VITAMIN C ADULT GUMMIES PO) [id number] Yes Take by mouth. [provider] Taking Active   blood glucose meter kit and supplies KIT [id number] Yes Dispense based on patient and insurance preference. Use fasting, prior to lunch and prior to dinner. (FOR ICD-10 E11.9) Leone Haven, MD Taking Active   Dulaglutide (TRULICITY) 1.5 WI/0.9BD SOPN [id number] Yes Inject 1.5 mg into the skin once a week. Briscoe Deutscher, DO Taking Active            Med Note Mayo Ao Feb 10, 2019 10:39 AM) Has not picked up yet   ELDERBERRY PO [id number] Yes Take by mouth. 1 gummy a day [provider] Taking Active   fluticasone (CUTIVATE) 0.05 % cream [id number]  APPLY TO AFFECTED AREA UP TO TWICE A DAY AS NEEDED FOR RASH/ITCHING Leone Haven, MD  Active   glucose blood (FREESTYLE TEST STRIPS) test strip [id number] No Check sugar bid  Patient not taking: Reported on 02/10/2019   Jackolyn Confer, MD Not Taking Active   glucose monitoring kit (FREESTYLE) monitoring kit [id number] No Use as directed to check sugars bid  Patient not taking: Reported on 02/10/2019   Jackolyn Confer, MD Not Taking Active   hydrochlorothiazide (MICROZIDE) 12.5 MG capsule [id number]  TAKE 1 CAPSULE BY MOUTH EVERY DAY Leone Haven, MD  Active  Discontinued 02/10/19 1042 (Completed Course)   Lancets (ONETOUCH ULTRASOFT) lancets [id number] Yes Use as instructed Jackolyn Confer, MD Taking Active   metFORMIN (GLUCOPHAGE-XR) 500 MG 24 hr tablet [id number] Yes TAKE 2 TABLETS BY MOUTH EVERY DAY WITH BREAKFAST Leone Haven, MD Taking Active            Med Note Mayo Ao Feb 10, 2019 10:37 AM) Taking 2 tablets BID   norethindrone (CAMILA) 0.35 MG tablet [id number] Yes Take 1 tablet (0.35 mg total) by mouth daily. Leone Haven, MD Taking Active            Assessment:   Goals Addressed            This Visit's Progress     Patient Stated   . "I want to get my sugars better under control" (pt-stated)       Current Barriers:   . Diabetes: uncontrolled; most recent A1c 10.9% o Patient recently established w/ Weight Management; Trulicity increased to 1.5 mg weekly o Patient notes that previously, she had been fairly nonadherent to her medication regimen. Notes significant stressors lately related to the  pandemic and finances  . Current antihyperglycemic regimen: metformin 1000 mg BID (though prescription for 1000 mg daily, patient notes she has been taking 2 500 mg tabs w/ lunch and 2 with supper) . Current meal patterns- reviewed extensively w/ Dr. Juleen China last week . Current blood glucose readings: not checking, notes she didn't have the money to pick up her meter yet d/t Trulicity being expensive ($75/month) . Cardiovascular risk reduction: o Current hypertensive regimen: HCTZ 12.5 mg daily;  o Current hyperlipidemia regimen: none; 10 year ASCVD risk 3.8%; however, patient contemplating pregnancy in the future  Pharmacist Clinical Goal(s):  Marland Kitchen Over the next 90 days, patient will work with PharmD and primary care provider to address optimized glycemic management  Interventions: . Comprehensive medication review performed, medication list updated in electronic medical record . Discussed goal A1c, goal fasting glucose and goal 2 hour post prandial glucose. Provided this information to patient via [employer] . Discussed use of copay cards to aid in medication access for Trulicity. Walked patient through the process of downloading a copay card online. She will take to the pharmacy.  . Discussed that she has ~2 boxes of Trulicity 0.16 mg at home. Will inject 2 pens per week to use this supply up, then will fill Trulicity 1.5 mg pen strength prescribed by Dr. Juleen China . Updating metformin prescription to reflect current usage.  . Patient's last Vitamin D was subtherapeutic (checked by Dr. Juleen China). Will discuss potentially adding daily OTC Vit D supplementation w/ Dr. Caryl Bis.   Patient Self Care Activities:  . Patient  will check blood glucose daily (alternating between fasting and 2 hour post prandial), document, and provide at future appointments . Patient will take medications as prescribed . Patient will report any questions or concerns to provider   Initial goal documentation        Plan: - Patient has f/u with Dr. Juleen China w/ weight management in 2 weeks. Scheduled f/u phone visit w/ me in ~5 weeks  Catie Darnelle Maffucci, PharmD, [place], [place] Pharmacist [place] [phone]

## 2019-02-11 NOTE — Progress Notes (Signed)
I have reviewed the above note and agree. I was available to the pharmacist for consultation. She can add OTC vitamin D 1000 IU daily and we can recheck at her next visit.   Tommi Rumps, MD

## 2019-02-17 ENCOUNTER — Other Ambulatory Visit: Payer: Self-pay

## 2019-02-17 ENCOUNTER — Encounter (INDEPENDENT_AMBULATORY_CARE_PROVIDER_SITE_OTHER): Payer: Self-pay | Admitting: Family Medicine

## 2019-02-17 ENCOUNTER — Ambulatory Visit (INDEPENDENT_AMBULATORY_CARE_PROVIDER_SITE_OTHER): Payer: 59 | Admitting: Family Medicine

## 2019-02-17 VITALS — BP 124/83 | HR 85 | Temp 97.4°F | Ht 65.0 in | Wt 284.0 lb

## 2019-02-17 DIAGNOSIS — F3289 Other specified depressive episodes: Secondary | ICD-10-CM | POA: Diagnosis not present

## 2019-02-17 DIAGNOSIS — Z9189 Other specified personal risk factors, not elsewhere classified: Secondary | ICD-10-CM | POA: Diagnosis not present

## 2019-02-17 DIAGNOSIS — E119 Type 2 diabetes mellitus without complications: Secondary | ICD-10-CM

## 2019-02-17 DIAGNOSIS — Z6841 Body Mass Index (BMI) 40.0 and over, adult: Secondary | ICD-10-CM

## 2019-02-17 DIAGNOSIS — E559 Vitamin D deficiency, unspecified: Secondary | ICD-10-CM | POA: Diagnosis not present

## 2019-02-17 MED ORDER — TRULICITY 1.5 MG/0.5ML ~~LOC~~ SOAJ: 1.5000 mg | SUBCUTANEOUS | 0 refills | Status: DC

## 2019-02-17 MED ORDER — VITAMIN D (ERGOCALCIFEROL) 1.25 MG (50000 UNIT) PO CAPS: 50000.0000 [IU] | ORAL_CAPSULE | ORAL | 0 refills | Status: DC

## 2019-02-18 NOTE — Progress Notes (Signed)
Office: [phone]  /  Fax: [phone]   HPI:   Chief Complaint: OBESITY Veronica Norton is here to discuss her progress with her obesity treatment plan. She is on the Category 1 plan and is following her eating plan approximately 50 % of the time. She states she is exercising 0 minutes 0 times per week. Veronica Norton is a 43 year old female, and she just had her period. She notes increased hunger. She is not checking her blood sugars and denies symptoms of low blood sugar, nausea, or vomiting.  Her weight is 284 lb (128.8 kg) today and has not lost weight since her last visit. She has lost 0 lbs since starting treatment with [place].  Vitamin D Deficiency Veronica Norton has a diagnosis of vitamin D deficiency. She is not currently taking Vit D and denies nausea, vomiting or muscle weakness.  Diabetes II Veronica Norton has a diagnosis of diabetes type II. Veronica Norton states she is not check her blood sugars at home. Last A1c was 10.9 on 01/15/2019, no microalbuminuria. She is taking Trulicity 1.5 mg SubQ weekly and metformin 500 mg XR daily. She denies hypoglycemia. She has been working on intensive lifestyle modifications including diet, exercise, and weight loss to help control her blood glucose levels.  At risk for cardiovascular disease Veronica Norton is at a higher than average risk for cardiovascular disease due to obesity and diabetes II. She currently denies any chest pain.  Depression with Emotional Eating Behaviors Veronica Norton notes some binge eating traits. Veronica Norton struggles with emotional eating and using food for comfort to the extent that it is negatively impacting her health. She often snacks when she is not hungry. Veronica Norton sometimes feels she is out of control and then feels guilty that she made poor food choices. She has been working on behavior modification techniques to help reduce her emotional eating and has been somewhat successful. She shows no sign of suicidal or homicidal ideations.  Depression screen Veronica Norton 2/9 02/03/2019  01/15/2019 07/03/2017 12/15/2016 09/09/2015  Decreased Interest 1 0 1 0 0  Down, Depressed, Hopeless 1 0 1 0 0  PHQ - 2 Score 2 0 2 0 0  Altered sleeping 0 - 2 - -  Tired, decreased energy 1 - 2 - -  Change in appetite 2 - 2 - -  Feeling bad or failure about yourself  1 - 2 - -  Trouble concentrating 0 - 1 - -  Moving slowly or fidgety/restless 1 - 0 - -  Suicidal thoughts 0 - 0 - -  PHQ-9 Score 7 - 11 - -  Difficult doing work/chores Not difficult at all - Somewhat difficult - -    ASSESSMENT AND PLAN:  Vitamin D deficiency - Plan: Vitamin D, Ergocalciferol, (DRISDOL) 1.25 MG (50000 UT) CAPS capsule  Type 2 diabetes mellitus without complication, without long-term current use of insulin (HCC) - Plan: Dulaglutide (TRULICITY) 1.5 JA/2.5KN SOPN  Other depression, emotional eating  At risk for heart disease  Class 3 severe obesity with serious comorbidity and body mass index (BMI) of 45.0 to 49.9 in adult, unspecified obesity type (HCC)  PLAN:  Vitamin D Deficiency Low vitamin D level contributes to fatigue and are associated with obesity, breast, and colon cancer. Veronica Norton agrees to start prescription Vit D 50,000 IU every week #4 with no refills. She will follow up for routine testing of vitamin D, at least 2-3 times per year to avoid over-replacement. Clarie agrees to follow up with our clinic in 2 weeks.  Diabetes II  Veronica Norton has been given diabetes education by myself today. Good blood sugar control is important to decrease the likelihood of diabetic complications such as nephropathy, neuropathy, limb loss, blindness, coronary artery disease, and death. Intensive lifestyle modification including diet, exercise and weight loss were discussed as the first line treatment for diabetes. Veronica Norton agrees to continue Trulicity 1.5 mg SubQ weekly #4 pens and we will refill for 1 month. We will recheck labs in 2 months. Veronica Norton agrees to follow up with our clinic in 2 weeks.  Cardiovascular risk  counseling Veronica Norton was given (~15 minutes) coronary artery disease prevention counseling today. She is 43 y.o. female and has risk factors for heart disease including obesity and diabetes II. We discussed intensive lifestyle modifications today with an emphasis on specific weight loss instructions and strategies.   Emotional Eating Behaviors (other depression) Behavior modification techniques were discussed today to help Veronica Norton deal with her emotional/non-hunger eating behaviors. We will continue to follow and monitor her progress.  Obesity Veronica Norton is currently in the action stage of change. As such, her goal is to continue with weight loss efforts She has agreed to follow the Category 1 plan Veronica Norton has been instructed to work up to a goal of 150 minutes of combined cardio and strengthening exercise per week for weight loss and overall health benefits. We discussed the following Behavioral Modification Strategies today: increasing lean protein intake, decreasing simple carbohydrates, increasing vegetables, increase H20 intake, work on meal planning and easy cooking plans, holiday eating strategies  and decrease liquid calories   Veronica Norton has agreed to follow up with our clinic in 2 weeks. She was informed of the importance of frequent follow up visits to maximize her success with intensive lifestyle modifications for her multiple health conditions.  ALLERGIES: No Known Allergies  MEDICATIONS: Current Outpatient Medications on File Prior to Visit  Medication Sig Dispense Refill  . APPLE CIDER VINEGAR PO Take by mouth. 2-3 gummies 2 times per day    . Ascorbic Acid (VITAMIN C ADULT GUMMIES PO) Take by mouth.    . blood glucose meter kit and supplies KIT Dispense based on patient and insurance preference. Use fasting, prior to lunch and prior to dinner. (FOR ICD-10 E11.9) 1 each 0  . ELDERBERRY PO Take by mouth. 1 gummy a day    . fluticasone (CUTIVATE) 0.05 % cream APPLY TO AFFECTED AREA UP TO TWICE A  DAY AS NEEDED FOR RASH/ITCHING 60 g 0  . glucose blood (FREESTYLE TEST STRIPS) test strip Check sugar bid 100 each 5  . glucose monitoring kit (FREESTYLE) monitoring kit Use as directed to check sugars bid 1 each 0  . hydrochlorothiazide (MICROZIDE) 12.5 MG capsule TAKE 1 CAPSULE BY MOUTH EVERY DAY 90 capsule 1  . Lancets (ONETOUCH ULTRASOFT) lancets Use as instructed 100 each 5  . metFORMIN (GLUCOPHAGE-XR) 500 MG 24 hr tablet TAKE 2 TABLETS BY MOUTH EVERY DAY WITH BREAKFAST 180 tablet 1  . norethindrone (CAMILA) 0.35 MG tablet Take 1 tablet (0.35 mg total) by mouth daily. 28 tablet 6   No current facility-administered medications on file prior to visit.     PAST MEDICAL HISTORY: Past Medical History:  Diagnosis Date  . Diabetes mellitus   . H/O pilonidal cyst   . Hypertension   . Normal cardiac stress test 2009  . Pre-diabetes     PAST SURGICAL HISTORY: Past Surgical History:  Procedure Laterality Date  . PILONIDAL CYST EXCISION    . WISDOM TOOTH EXTRACTION  SOCIAL HISTORY: Social History   Tobacco Use  . Smoking status: Never Smoker  . Smokeless tobacco: Never Used  Substance Use Topics  . Alcohol use: Yes    Comment: Occasional  . Drug use: No    FAMILY HISTORY: Family History  Problem Relation Age of Onset  . Heart disease Mother        2005 stent placement  . Hypertension Mother   . Diabetes Father   . Hypertension Father   . Obesity Father   . Stroke Maternal Aunt   . Cancer Maternal Grandfather        prostate  . Heart disease Maternal Grandfather   . Heart disease Maternal Uncle   . Heart disease Maternal Grandmother    ROS: Review of Systems  Constitutional: Negative for weight loss.  Cardiovascular: Negative for chest pain.  Gastrointestinal: Negative for nausea and vomiting.  Musculoskeletal:       Negative muscle weakness  Endo/Heme/Allergies:       Negative hypoglycemia  Psychiatric/Behavioral: Positive for depression. Negative for  suicidal ideas.    PHYSICAL EXAM: Blood pressure 124/83, pulse 85, temperature (!) 97.4 F (36.3 C), temperature source Oral, height 5' 5" (1.651 m), weight 284 lb (128.8 kg), last menstrual period 02/12/2019, SpO2 98 %. Body mass index is 47.26 kg/m. Physical Exam Vitals signs reviewed.  Constitutional:      Appearance: Normal appearance. She is obese.  Cardiovascular:     Rate and Rhythm: Normal rate.     Pulses: Normal pulses.  Pulmonary:     Effort: Pulmonary effort is normal.     Breath sounds: Normal breath sounds.  Musculoskeletal: Normal range of motion.  Skin:    General: Skin is warm and dry.  Neurological:     Mental Status: She is alert and oriented to person, place, and time.  Psychiatric:        Mood and Affect: Mood normal.        Behavior: Behavior normal.     RECENT LABS AND TESTS: BMET    Component Value Date/Time   NA 132 (L) 01/15/2019 1629   K 3.8 01/15/2019 1629   CL 97 01/15/2019 1629   CO2 28 01/15/2019 1629   GLUCOSE 186 (H) 01/15/2019 1629   BUN 12 01/15/2019 1629   CREATININE 0.78 01/15/2019 1629   CREATININE 0.67 02/06/2018 1506   CALCIUM 8.9 01/15/2019 1629   Lab Results  Component Value Date   HGBA1C 10.9 (H) 01/15/2019   HGBA1C 7.8 (A) 05/22/2018   HGBA1C 9.1 (H) 02/06/2018   HGBA1C 7.5 (A) 10/10/2017   HGBA1C 7.5 (H) 07/03/2017   No results found for: INSULIN CBC    Component Value Date/Time   WBC 7.8 01/15/2019 1629   RBC 4.62 01/15/2019 1629   HGB 15.2 (H) 01/15/2019 1629   HCT 44.9 01/15/2019 1629   PLT 360.0 01/15/2019 1629   MCV 97.2 01/15/2019 1629   MCH 33.6 (H) 02/06/2018 1506   MCHC 33.9 01/15/2019 1629   RDW 12.9 01/15/2019 1629   LYMPHSABS 2.1 07/03/2017 1031   MONOABS 0.5 07/03/2017 1031   EOSABS 0.1 07/03/2017 1031   BASOSABS 0.0 07/03/2017 1031   Iron/TIBC/Ferritin/ %Sat No results found for: IRON, TIBC, FERRITIN, IRONPCTSAT Lipid Panel     Component Value Date/Time   CHOL 139 01/15/2019 1629    TRIG 115.0 01/15/2019 1629   HDL 42.40 01/15/2019 1629   CHOLHDL 3 01/15/2019 1629   VLDL 23.0 01/15/2019 1629   LDLCALC 74 01/15/2019  1629   Hepatic Function Panel     Component Value Date/Time   PROT 7.1 01/15/2019 1629   ALBUMIN 3.8 01/15/2019 1629   AST 14 01/15/2019 1629   ALT 17 01/15/2019 1629   ALKPHOS 72 01/15/2019 1629   BILITOT 0.2 01/15/2019 1629      Component Value Date/Time   TSH 1.480 02/03/2019 0904   TSH 1.74 02/06/2018 1506   TSH 1.44 07/03/2017 1031    OBESITY BEHAVIORAL INTERVENTION VISIT  Today's visit was # 2   Starting weight: 284 lbs Starting date: 02/03/2019 Today's weight : 284 lbs Today's date: 02/17/2019 Total lbs lost to date: 0   ASK: We discussed the diagnosis of obesity with Veronica Norton today and Veronica Norton agreed to give [place] permission to discuss obesity behavioral modification therapy today.  ASSESS: Tahesha has the diagnosis of obesity and her BMI today is 47.26 Veronica Norton is in the action stage of change   ADVISE: Veronica Norton was educated on the multiple health risks of obesity as well as the benefit of weight loss to improve her health. She was advised of the need for long term treatment and the importance of lifestyle modifications to improve her current health and to decrease her risk of future health problems.  AGREE: Multiple dietary modification options and treatment options were discussed and  Veronica Norton agreed to follow the recommendations documented in the above note.  ARRANGE: Veronica Norton was educated on the importance of frequent visits to treat obesity as outlined per CMS and USPSTF guidelines and agreed to schedule her next follow up appointment today.  Veronica Norton, am acting as transcriptionist for Briscoe Deutscher, DO  I have reviewed the above documentation for accuracy and completeness, and I agree with the above. Briscoe Deutscher, DO

## 2019-02-18 NOTE — Progress Notes (Deleted)
Office: [phone]  /  Fax: [phone]   HPI:   Chief Complaint: OBESITY Bernadett is here to discuss her progress with her obesity treatment plan. She is on the  {MWMwtlossportion/plan#2:210800006} and is following her eating plan approximately *** % of the time. She states she is exercising *** minutes *** times per week. Zaia is currently struggling with ***.  Her weight is 284 lb (128.8 kg) today and {misc; weight loss:12477} since her last visit. She has lost *** lbs since starting treatment with [place].  Vitamin D Deficiency Neriah has a diagnosis of vitamin D deficiency. She {ACTION; IS/IS FSE:39532023} currently taking vit D and denies nausea, vomiting or muscle weakness.  Diabetes II Ceniya has a diagnosis of diabetes type II. Ivah states {home sugars:14018} and {Actions; denies/reports/admits to:19208} any hypoglycemic episodes. Last A1c was Hemoglobin A1C Latest Ref Rng & Units 01/15/2019 05/22/2018  HGBA1C 4.6 - 6.5 % 10.9(H) 7.8(A)  Some recent data might be hidden    She has been working on intensive lifestyle modifications including diet, exercise, and weight loss to help control her blood glucose levels.  At risk for cardiovascular disease Albert is at a higher than average risk for cardiovascular disease due to obesity and diabetes II. She currently denies any chest pain.  Depression with Emotional Eating Behaviors Brenna is struggling with emotional eating and using food for comfort to the extent that it is negatively impacting her health. She often snacks when she is not hungry. Dyasia sometimes feels she is out of control and then feels guilty that she made poor food choices. She has been working on behavior modification techniques to help reduce her emotional eating and has been {success:31859}. She shows no sign of suicidal or homicidal ideations.  Depression screen [facility] 2/9 02/03/2019 01/15/2019 07/03/2017 12/15/2016 09/09/2015  Decreased Interest 1 0 1 0 0  Down, Depressed,  Hopeless 1 0 1 0 0  PHQ - 2 Score 2 0 2 0 0  Altered sleeping 0 - 2 - -  Tired, decreased energy 1 - 2 - -  Change in appetite 2 - 2 - -  Feeling bad or failure about yourself  1 - 2 - -  Trouble concentrating 0 - 1 - -  Moving slowly or fidgety/restless 1 - 0 - -  Suicidal thoughts 0 - 0 - -  PHQ-9 Score 7 - 11 - -  Difficult doing work/chores Not difficult at all - Somewhat difficult - -    ASSESSMENT AND PLAN:  Vitamin D deficiency - Plan: Vitamin D, Ergocalciferol, (DRISDOL) 1.25 MG (50000 UT) CAPS capsule  Type 2 diabetes mellitus without complication, without long-term current use of insulin (HCC) - Plan: Dulaglutide (TRULICITY) 1.5 XI/3.5WY SOPN  Other depression, emotional eating  At risk for heart disease  Class 3 severe obesity with serious comorbidity and body mass index (BMI) of 45.0 to 49.9 in adult, unspecified obesity type (HCC)  PLAN:  Vitamin D Deficiency Low vitamin D level contributes to fatigue and are associated with obesity, breast, and colon cancer. She agrees to continue to take prescription Vit D 50,000 IU every week and will follow up for routine testing of vitamin D, at least 2-3 times per year to avoid over-replacement.  Diabetes II Evalena has been given diabetes education by myself today. Good blood sugar control is important to decrease the likelihood of diabetic complications such as nephropathy, neuropathy, limb loss, blindness, coronary artery disease, and death. Intensive lifestyle modification including diet, exercise and weight loss  were discussed as the first line treatment for diabetes.   Cardiovascular risk counseling Deondria was given (~15 minutes) coronary artery disease prevention counseling today. She is 43 y.o. female and has risk factors for heart disease including obesity and diabetes II. We discussed intensive lifestyle modifications today with an emphasis on specific weight loss instructions and strategies.   Emotional Eating Behaviors  (other depression) Behavior modification techniques were discussed today to help Kawehi deal with her emotional/non-hunger eating behaviors. Will continue to follow and monitor her progress.  Obesity Marylen {CHL AMB IS/IS NOT:210130109} currently in the action stage of change. As such, her goal is to {MWMwtloss#1:210800005} She has agreed to {MWMwtlossportion/plan#2:210800006} Deyana has been instructed to work up to a goal of 150 minutes of combined cardio and strengthening exercise per week for weight loss and overall health benefits. We discussed the following Behavioral Modification Strategies today: {MWMwtlossdietstrategies#3:210800007}   Lillyanne has agreed to follow up with our clinic in {NUMBER 1-10:22536} weeks. She was informed of the importance of frequent follow up visits to maximize her success with intensive lifestyle modifications for her multiple health conditions.  ALLERGIES: No Known Allergies  MEDICATIONS: Current Outpatient Medications on File Prior to Visit  Medication Sig Dispense Refill  . APPLE CIDER VINEGAR PO Take by mouth. 2-3 gummies 2 times per day    . Ascorbic Acid (VITAMIN C ADULT GUMMIES PO) Take by mouth.    . blood glucose meter kit and supplies KIT Dispense based on patient and insurance preference. Use fasting, prior to lunch and prior to dinner. (FOR ICD-10 E11.9) 1 each 0  . ELDERBERRY PO Take by mouth. 1 gummy a day    . fluticasone (CUTIVATE) 0.05 % cream APPLY TO AFFECTED AREA UP TO TWICE A DAY AS NEEDED FOR RASH/ITCHING 60 g 0  . glucose blood (FREESTYLE TEST STRIPS) test strip Check sugar bid 100 each 5  . glucose monitoring kit (FREESTYLE) monitoring kit Use as directed to check sugars bid 1 each 0  . hydrochlorothiazide (MICROZIDE) 12.5 MG capsule TAKE 1 CAPSULE BY MOUTH EVERY DAY 90 capsule 1  . Lancets (ONETOUCH ULTRASOFT) lancets Use as instructed 100 each 5  . metFORMIN (GLUCOPHAGE-XR) 500 MG 24 hr tablet TAKE 2 TABLETS BY MOUTH EVERY DAY WITH  BREAKFAST 180 tablet 1  . norethindrone (CAMILA) 0.35 MG tablet Take 1 tablet (0.35 mg total) by mouth daily. 28 tablet 6   No current facility-administered medications on file prior to visit.     PAST MEDICAL HISTORY: Past Medical History:  Diagnosis Date  . Diabetes mellitus   . H/O pilonidal cyst   . Hypertension   . Normal cardiac stress test 2009  . Pre-diabetes     PAST SURGICAL HISTORY: Past Surgical History:  Procedure Laterality Date  . PILONIDAL CYST EXCISION    . WISDOM TOOTH EXTRACTION      SOCIAL HISTORY: Social History   Tobacco Use  . Smoking status: Never Smoker  . Smokeless tobacco: Never Used  Substance Use Topics  . Alcohol use: Yes    Comment: Occasional  . Drug use: No    FAMILY HISTORY: Family History  Problem Relation Age of Onset  . Heart disease Mother        2005 stent placement  . Hypertension Mother   . Diabetes Father   . Hypertension Father   . Obesity Father   . Stroke Maternal Aunt   . Cancer Maternal Grandfather        prostate  .  Heart disease Maternal Grandfather   . Heart disease Maternal Uncle   . Heart disease Maternal Grandmother     ROS: Review of Systems  Constitutional: Negative for weight loss.  Cardiovascular: Negative for chest pain.  Gastrointestinal: Negative for nausea and vomiting.  Musculoskeletal:       Negative muscle weakness  Endo/Heme/Allergies:       Negative hypoglycemia  Psychiatric/Behavioral: Positive for depression. Negative for suicidal ideas.    PHYSICAL EXAM: Blood pressure 124/83, pulse 85, temperature (!) 97.4 F (36.3 C), temperature source Oral, height '5\' 5"'$  (1.651 m), weight 284 lb (128.8 kg), last menstrual period 02/12/2019, SpO2 98 %. Body mass index is 47.26 kg/m. Physical Exam Vitals signs reviewed.  Constitutional:      Appearance: Normal appearance. She is obese.  Cardiovascular:     Rate and Rhythm: Normal rate.     Pulses: Normal pulses.  Pulmonary:     Effort:  Pulmonary effort is normal.     Breath sounds: Normal breath sounds.  Musculoskeletal: Normal range of motion.  Skin:    General: Skin is warm and dry.  Neurological:     Mental Status: She is alert and oriented to person, place, and time.  Psychiatric:        Mood and Affect: Mood normal.        Behavior: Behavior normal.     RECENT LABS AND TESTS: BMET    Component Value Date/Time   NA 132 (L) 01/15/2019 1629   K 3.8 01/15/2019 1629   CL 97 01/15/2019 1629   CO2 28 01/15/2019 1629   GLUCOSE 186 (H) 01/15/2019 1629   BUN 12 01/15/2019 1629   CREATININE 0.78 01/15/2019 1629   CREATININE 0.67 02/06/2018 1506   CALCIUM 8.9 01/15/2019 1629   Lab Results  Component Value Date   HGBA1C 10.9 (H) 01/15/2019   HGBA1C 7.8 (A) 05/22/2018   HGBA1C 9.1 (H) 02/06/2018   HGBA1C 7.5 (A) 10/10/2017   HGBA1C 7.5 (H) 07/03/2017   No results found for: INSULIN CBC    Component Value Date/Time   WBC 7.8 01/15/2019 1629   RBC 4.62 01/15/2019 1629   HGB 15.2 (H) 01/15/2019 1629   HCT 44.9 01/15/2019 1629   PLT 360.0 01/15/2019 1629   MCV 97.2 01/15/2019 1629   MCH 33.6 (H) 02/06/2018 1506   MCHC 33.9 01/15/2019 1629   RDW 12.9 01/15/2019 1629   LYMPHSABS 2.1 07/03/2017 1031   MONOABS 0.5 07/03/2017 1031   EOSABS 0.1 07/03/2017 1031   BASOSABS 0.0 07/03/2017 1031   Iron/TIBC/Ferritin/ %Sat No results found for: IRON, TIBC, FERRITIN, IRONPCTSAT Lipid Panel     Component Value Date/Time   CHOL 139 01/15/2019 1629   TRIG 115.0 01/15/2019 1629   HDL 42.40 01/15/2019 1629   CHOLHDL 3 01/15/2019 1629   VLDL 23.0 01/15/2019 1629   LDLCALC 74 01/15/2019 1629   Hepatic Function Panel     Component Value Date/Time   PROT 7.1 01/15/2019 1629   ALBUMIN 3.8 01/15/2019 1629   AST 14 01/15/2019 1629   ALT 17 01/15/2019 1629   ALKPHOS 72 01/15/2019 1629   BILITOT 0.2 01/15/2019 1629      Component Value Date/Time   TSH 1.480 02/03/2019 0904   TSH 1.74 02/06/2018 1506   TSH  1.44 07/03/2017 1031      OBESITY BEHAVIORAL INTERVENTION VISIT  Today's visit was # {Numbers; 3-42:87681}   Starting weight: *** Starting date: *** Today's weight : 284 lbs Today's date: 02/17/2019  Total lbs lost to date: ***    ASK: We discussed the diagnosis of obesity with [place] today and Kiya agreed to give [place] permission to discuss obesity behavioral modification therapy today.  ASSESS: Bethenny has the diagnosis of obesity and her BMI today is _0 @ [place] {ACTION; IS/IS DTH:43888757} in the action stage of change   ADVISE: Crysten was educated on the multiple health risks of obesity as well as the benefit of weight loss to improve her health. She was advised of the need for long term treatment and the importance of lifestyle modifications to improve her current health and to decrease her risk of future health problems.  AGREE: Multiple dietary modification options and treatment options were discussed and  Romey agreed to follow the recommendations documented in the above note.  ARRANGE: Michaelyn was educated on the importance of frequent visits to treat obesity as outlined per CMS and USPSTF guidelines and agreed to schedule her next follow up appointment today.  I, ***, am acting as transcriptionist for Briscoe Deutscher, DO

## 2019-03-03 ENCOUNTER — Encounter (INDEPENDENT_AMBULATORY_CARE_PROVIDER_SITE_OTHER): Payer: Self-pay | Admitting: Family Medicine

## 2019-03-03 ENCOUNTER — Ambulatory Visit (INDEPENDENT_AMBULATORY_CARE_PROVIDER_SITE_OTHER): Payer: 59 | Admitting: Family Medicine

## 2019-03-03 ENCOUNTER — Other Ambulatory Visit: Payer: Self-pay

## 2019-03-03 VITALS — BP 128/83 | HR 88 | Temp 98.6°F | Ht 65.0 in | Wt 283.0 lb

## 2019-03-03 DIAGNOSIS — F3289 Other specified depressive episodes: Secondary | ICD-10-CM | POA: Diagnosis not present

## 2019-03-03 DIAGNOSIS — Z6841 Body Mass Index (BMI) 40.0 and over, adult: Secondary | ICD-10-CM

## 2019-03-03 DIAGNOSIS — E559 Vitamin D deficiency, unspecified: Secondary | ICD-10-CM | POA: Diagnosis not present

## 2019-03-03 DIAGNOSIS — E119 Type 2 diabetes mellitus without complications: Secondary | ICD-10-CM

## 2019-03-03 DIAGNOSIS — Z9189 Other specified personal risk factors, not elsewhere classified: Secondary | ICD-10-CM | POA: Diagnosis not present

## 2019-03-03 MED ORDER — VITAMIN D (ERGOCALCIFEROL) 1.25 MG (50000 UNIT) PO CAPS: 50000.0000 [IU] | ORAL_CAPSULE | ORAL | 0 refills | Status: DC

## 2019-03-10 ENCOUNTER — Encounter (INDEPENDENT_AMBULATORY_CARE_PROVIDER_SITE_OTHER): Payer: Self-pay | Admitting: Family Medicine

## 2019-03-10 MED ORDER — TRULICITY 1.5 MG/0.5ML ~~LOC~~ SOAJ: 1.5000 mg | SUBCUTANEOUS | 0 refills | Status: DC

## 2019-03-10 MED ORDER — METFORMIN HCL ER 500 MG PO TB24: ORAL_TABLET | ORAL | 0 refills | Status: DC

## 2019-03-10 NOTE — Progress Notes (Signed)
Office: [phone]  /  Fax: [phone]   HPI:  Chief Complaint: OBESITY Veronica Norton is here to discuss her progress with her obesity treatment plan. She is on the Category 1 plan and states she is following her eating plan approximately 50 % of the time. She states she is exercising 0 minutes 0 times per week.  Veronica Norton states she is not hungry, but is making herself focus on protein. She is not drinking much alcohol. She is unfortunately not losing weight.  Today's visit was # 3  Starting weight: 284 lbs Starting date: 02/03/2019 Today's weight : 283 lbs Today's date: 03/03/2019 Total lbs lost to date: 1 Total lbs lost since last in-office visit: 1  Vitamin D Deficiency Veronica Norton has a diagnosis of vitamin D deficiency. She is stable on Vit D.  At risk for osteopenia and osteoporosis Veronica Norton is at higher risk of osteopenia and osteoporosis due to Vitamin D deficiency.   Diabetes II Veronica Norton has a diagnosis of diabetes type II. Last A1c was 10.9 on 01/25/2019. She is taking Trulicity 1.5 mg and metformin 500 mg q AM. She is stable and denies polyphagia.  Depression with Emotional Eating Behaviors Veronica Norton notes some poor eating patterns. She has some signs of ADD. She is "afraid to take the test".   ASSESSMENT AND PLAN:  Vitamin D deficiency - Plan: Vitamin D, Ergocalciferol, (DRISDOL) 1.25 MG (50000 UT) CAPS capsule  Type 2 diabetes mellitus without complication, without long-term current use of insulin (HCC)  Other depression, emotional eating  At risk for osteoporosis  Class 3 severe obesity with serious comorbidity and body mass index (BMI) of 45.0 to 49.9 in adult, unspecified obesity type ([place])  PLAN:  Vitamin D Deficiency Low Vitamin D level contributes to fatigue and are associated with obesity, breast, and colon cancer. Reise agrees to continue taking prescription Vitamin D 50,000 IU every week #4 and we will refill for 1 month. She will follow up for routine testing of  vitamin D, at least 2-3 times per year to avoid over-replacement. Veronica Norton agrees to follow up with [place] as directed.  At risk for osteopenia and osteoporosis [name] was given extended (15 minutes) osteoporosis prevention counseling today. [name] is at risk for osteopenia and osteoporosis due to her Vitamin D deficiency. She was encouraged to take her Vitamin D and follow her higher calcium diet and increase strengthening exercise to help strengthen her bones and decrease her risk of osteopenia and osteoporosis.  Diabetes II Veronica Norton has been given diabetes education by myself today. Good blood sugar control is important to decrease the likelihood of diabetic complications such as nephropathy, neuropathy, limb loss, blindness, coronary artery disease, and death. Intensive lifestyle modification including diet, exercise and weight loss were discussed as the first line treatment for diabetes. Veronica Norton agrees to continue taking metformin 500 mg 2 tablets PO q AM #60 and we will refill for 1 month; she agrees to continue Trulicity 1.5 mg SubQ weekly #4 pens and we will refill for 1 month. Veronica Norton agrees to follow up with [place] as directed.  Emotional Eating Behaviors (Other Depression) Behavior modification techniques were discussed today to help Malyn deal with her emotional/non-hunger eating behaviors. Andretta is to keep a strict food log, and we will continue to follow and monitor her progress.  Obesity Veronica Norton is currently in the action stage of change. As such, her goal is to continue with weight loss efforts. She has agreed to follow the Category 1 plan. Veronica Norton has been instructed to  work up to a goal of 150 minutes of combined cardio and strengthening exercise per week for weight loss and overall health benefits. We discussed the following Behavioral Modification Strategies today: increasing lean protein intake, increasing water intake and meal planning and cooking strategies.   Veronica Norton has agreed to follow-up with  our clinic in 3 weeks. She was informed of the importance of frequent follow-up visits to maximize her success with intensive lifestyle modifications for her multiple health conditions.  ALLERGIES: No Known Allergies  MEDICATIONS: Current Outpatient Medications on File Prior to Visit  Medication Sig Dispense Refill  . APPLE CIDER VINEGAR PO Take by mouth. 2-3 gummies 2 times per day    . Ascorbic Acid (VITAMIN C ADULT GUMMIES PO) Take by mouth.    . blood glucose meter kit and supplies KIT Dispense based on patient and insurance preference. Use fasting, prior to lunch and prior to dinner. (FOR ICD-10 E11.9) 1 each 0  . Dulaglutide (TRULICITY) 1.5 KG/2.5KY SOPN Inject 1.5 mg into the skin once a week. 4 pen 0  . ELDERBERRY PO Take by mouth. 1 gummy a day    . fluticasone (CUTIVATE) 0.05 % cream APPLY TO AFFECTED AREA UP TO TWICE A DAY AS NEEDED FOR RASH/ITCHING 60 g 0  . glucose blood (FREESTYLE TEST STRIPS) test strip Check sugar bid 100 each 5  . glucose monitoring kit (FREESTYLE) monitoring kit Use as directed to check sugars bid 1 each 0  . hydrochlorothiazide (MICROZIDE) 12.5 MG capsule TAKE 1 CAPSULE BY MOUTH EVERY DAY 90 capsule 1  . Lancets (ONETOUCH ULTRASOFT) lancets Use as instructed 100 each 5  . metFORMIN (GLUCOPHAGE-XR) 500 MG 24 hr tablet TAKE 2 TABLETS BY MOUTH EVERY DAY WITH BREAKFAST 180 tablet 1  . norethindrone (CAMILA) 0.35 MG tablet Take 1 tablet (0.35 mg total) by mouth daily. 28 tablet 6   No current facility-administered medications on file prior to visit.    PAST MEDICAL HISTORY: Past Medical History:  Diagnosis Date  . Diabetes mellitus   . H/O pilonidal cyst   . Hypertension   . Normal cardiac stress test 2009  . Pre-diabetes     PAST SURGICAL HISTORY: Past Surgical History:  Procedure Laterality Date  . PILONIDAL CYST EXCISION    . WISDOM TOOTH EXTRACTION      SOCIAL HISTORY: Social History   Tobacco Use  . Smoking status: Never Smoker  .  Smokeless tobacco: Never Used  Substance Use Topics  . Alcohol use: Yes    Comment: Occasional  . Drug use: No    FAMILY HISTORY: Family History  Problem Relation Age of Onset  . Heart disease Mother        2005 stent placement  . Hypertension Mother   . Diabetes Father   . Hypertension Father   . Obesity Father   . Stroke Maternal Aunt   . Cancer Maternal Grandfather        prostate  . Heart disease Maternal Grandfather   . Heart disease Maternal Uncle   . Heart disease Maternal Grandmother     ROS: Review of Systems  Constitutional: Positive for weight loss.  Endo/Heme/Allergies:       Negative polyphagia  Psychiatric/Behavioral: Positive for depression.    PHYSICAL EXAM: Blood pressure 128/83, pulse 88, temperature 98.6 F (37 C), temperature source Oral, height 5' 5"  (1.651 m), weight 283 lb (128.4 kg), last menstrual period 02/12/2019, SpO2 97 %. Body mass index is 47.09 kg/m.  General: Cooperative, alert,  well developed, in no acute distress. HEENT: Conjunctivae and lids unremarkable. Neck: No thyromegaly.  Cardiovascular: Regular rhythm.  Lungs: Normal work of breathing. Extremities: No edema.  Neurologic: No focal deficits.   RECENT LABS AND TESTS: BMET    Component Value Date/Time   NA 132 (L) 01/15/2019 1629   K 3.8 01/15/2019 1629   CL 97 01/15/2019 1629   CO2 28 01/15/2019 1629   GLUCOSE 186 (H) 01/15/2019 1629   BUN 12 01/15/2019 1629   CREATININE 0.78 01/15/2019 1629   CREATININE 0.67 02/06/2018 1506   CALCIUM 8.9 01/15/2019 1629   Lab Results  Component Value Date   HGBA1C 10.9 (H) 01/15/2019   HGBA1C 7.8 (A) 05/22/2018   HGBA1C 9.1 (H) 02/06/2018   HGBA1C 7.5 (A) 10/10/2017   HGBA1C 7.5 (H) 07/03/2017   No results found for: INSULIN CBC    Component Value Date/Time   WBC 7.8 01/15/2019 1629   RBC 4.62 01/15/2019 1629   HGB 15.2 (H) 01/15/2019 1629   HCT 44.9 01/15/2019 1629   PLT 360.0 01/15/2019 1629   MCV 97.2 01/15/2019  1629   MCH 33.6 (H) 02/06/2018 1506   MCHC 33.9 01/15/2019 1629   RDW 12.9 01/15/2019 1629   LYMPHSABS 2.1 07/03/2017 1031   MONOABS 0.5 07/03/2017 1031   EOSABS 0.1 07/03/2017 1031   BASOSABS 0.0 07/03/2017 1031   Iron/TIBC/Ferritin/ %Sat No results found for: IRON, TIBC, FERRITIN, IRONPCTSAT Lipid Panel     Component Value Date/Time   CHOL 139 01/15/2019 1629   TRIG 115.0 01/15/2019 1629   HDL 42.40 01/15/2019 1629   CHOLHDL 3 01/15/2019 1629   VLDL 23.0 01/15/2019 1629   LDLCALC 74 01/15/2019 1629   Hepatic Function Panel     Component Value Date/Time   PROT 7.1 01/15/2019 1629   ALBUMIN 3.8 01/15/2019 1629   AST 14 01/15/2019 1629   ALT 17 01/15/2019 1629   ALKPHOS 72 01/15/2019 1629   BILITOT 0.2 01/15/2019 1629      Component Value Date/Time   TSH 1.480 02/03/2019 0904   TSH 1.74 02/06/2018 1506   TSH 1.44 07/03/2017 1031    I, Trixie Dredge, am acting as Location manager for [employer], DO.  I have reviewed the above documentation for accuracy and completeness, and I agree with the above. Briscoe Deutscher, DO

## 2019-03-17 ENCOUNTER — Telehealth: Payer: 59

## 2019-03-17 ENCOUNTER — Ambulatory Visit: Payer: Self-pay | Admitting: Pharmacist

## 2019-03-17 NOTE — Chronic Care Management (AMB) (Signed)
  Chronic Care Management   Note  03/17/2019 Name: Veronica Norton MRN: [id number] DOB: 1975-05-24  Veronica Norton is a 44 y.o. year old female who is a primary care patient of Caryl Bis, Angela Adam, MD. The CCM team was consulted for assistance with chronic disease management and care coordination needs.    Attempted to contact patient for medication management follow up. Left message for her to return my call at her convenience.   Follow up plan: - Will collaborate w/ Care Guide to outreach to reschedule follow up phone call with me  Catie Darnelle Maffucci, PharmD, [place], [place] Pharmacist [place] [phone]

## 2019-03-18 ENCOUNTER — Telehealth: Payer: Self-pay | Admitting: Family Medicine

## 2019-03-18 NOTE — Chronic Care Management (AMB) (Signed)
  Care Management   Note  03/18/2019 Name: Veronica Norton MRN: [id number] DOB: [birth year]  Veronica Norton is a 44 y.o. year old female who is a primary care patient of Leone Haven, MD and is actively engaged with the care management team. I reached out to [facility] by phone today to assist with re-scheduling a follow up appointment with the RN Case Manager  Follow up plan: Telephone appointment with care management team member scheduled for: 04/17/2019  [place], [place] [facility]  [place], [place] [address] Direct Dial: [email or web address]  Website: [email or web address]

## 2019-03-24 ENCOUNTER — Other Ambulatory Visit: Payer: Self-pay

## 2019-03-24 ENCOUNTER — Encounter (INDEPENDENT_AMBULATORY_CARE_PROVIDER_SITE_OTHER): Payer: Self-pay | Admitting: Family Medicine

## 2019-03-24 ENCOUNTER — Ambulatory Visit (INDEPENDENT_AMBULATORY_CARE_PROVIDER_SITE_OTHER): Payer: 59 | Admitting: Family Medicine

## 2019-03-24 VITALS — BP 129/84 | HR 88 | Temp 98.3°F | Ht 65.0 in | Wt 280.0 lb

## 2019-03-24 DIAGNOSIS — Z6841 Body Mass Index (BMI) 40.0 and over, adult: Secondary | ICD-10-CM

## 2019-03-24 DIAGNOSIS — E559 Vitamin D deficiency, unspecified: Secondary | ICD-10-CM

## 2019-03-24 DIAGNOSIS — Z9189 Other specified personal risk factors, not elsewhere classified: Secondary | ICD-10-CM

## 2019-03-24 DIAGNOSIS — F3289 Other specified depressive episodes: Secondary | ICD-10-CM | POA: Diagnosis not present

## 2019-03-24 DIAGNOSIS — E119 Type 2 diabetes mellitus without complications: Secondary | ICD-10-CM

## 2019-03-24 MED ORDER — VITAMIN D (ERGOCALCIFEROL) 1.25 MG (50000 UNIT) PO CAPS: 50000.0000 [IU] | ORAL_CAPSULE | ORAL | 0 refills | Status: DC

## 2019-03-25 ENCOUNTER — Other Ambulatory Visit: Payer: Self-pay | Admitting: Family Medicine

## 2019-03-25 NOTE — Progress Notes (Signed)
Chief Complaint:   OBESITY Veronica Norton is here to discuss her progress with her obesity treatment plan along with follow-up of her obesity related diagnoses. Veronica Norton is on the Category 1 Plan and states she is following her eating plan approximately 50% of the time. 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  Today's visit was #: 4 Starting weight: 284 lbs Starting date: 02/03/2019 Today's weight: 280 lbs Today's date: 03/23/2018 Total lbs lost to date: 4 lbs Total lbs lost since last in-office visit: 3 lbs  Interim History: Veronica Norton was off the plan during the holidays but made good decisions regarding serving sizes.  Says she feels down (has SAD).    Recommend she increase sunlight exposure and exercise and try to adhere to her diet at least 80%.  Subjective:   1. Type 2 diabetes mellitus uncontrolled, without long-term current use of insulin (HCC) Veronica Norton has a diagnosis of prediabetes based on her elevated HgA1c and was informed this puts her at greater risk of developing diabetes. She continues to work on diet and exercise to decrease her risk of diabetes. She denies nausea or hypoglycemia.  She is taking Trulicity 1.5 mg weekly and metformin 1000 mg daily.  Lab Results  Component Value Date   HGBA1C 10.9 (H) 01/15/2019   HGBA1C 7.8 (A) 05/22/2018   HGBA1C 9.1 (H) 02/06/2018   Lab Results  Component Value Date   MICROALBUR 0.8 01/15/2019   LDLCALC 74 01/15/2019   CREATININE 0.78 01/15/2019   2. Other depression, with emotional eating This has worsened due to the patient's seasonal affective disorder.  Veronica Norton is struggling with emotional eating and using food for comfort to the extent that it is negatively impacting her health. She often snacks when she is not hungry. Veronica Norton sometimes feels she is out of control and then feels guilty that she made poor food choices. She has been working on behavior modification techniques to help reduce her  emotional eating and has been unsuccessful. She shows no sign of suicidal or homicidal ideations.  3. Vitamin D deficiency She's Vitamin D level was 28.2 on 02/03/2019. She is currently taking vit D. She denies nausea, vomiting or muscle weakness.  4. At risk for heart disease [name] is at a higher than average risk for cardiovascular disease due to obesity. Reviewed: no chest pain on exertion, no dyspnea on exertion, and no swelling of ankles.  Assessment/Plan:   1. Type 2 diabetes mellitus uncontrolled, without long-term current use of insulin (HCC) Veronica Norton has been given diabetes education by myself today. Good blood sugar control is important to decrease the likelihood of diabetic complications such as nephropathy, neuropathy, limb loss, blindness, coronary artery disease, and death. Intensive lifestyle modification including diet, exercise and weight loss were discussed as the first line treatment for diabetes.   2. Other depression, with emotional eating Behavior modification techniques were discussed today to help Jimmie deal with her emotional/non-hunger eating behaviors.  The patient will work on increasing her sunlight exposure.  Orders and follow up as documented in patient record.   3. Vitamin D deficiency Low Vitamin D level contributes to fatigue and are associated with obesity, breast, and colon cancer. She agrees to continue to take prescription Vitamin D @50 ,000 IU every week and will follow-up for routine testing of vitamin D, at least 2-3 times per year to avoid over-replacement.  Orders - Vitamin D, Ergocalciferol, (DRISDOL) 1.25 MG (50000 UNIT) CAPS capsule; Take 1  capsule (50,000 Units total) by mouth every 7 (seven) days.  Dispense: 4 capsule; Refill: 0  4. At risk for heart disease Lyrics was given approximately 15 minutes of coronary artery disease prevention counseling today. She is 44 y.o. female and has risk factors for heart disease including obesity. We discussed  intensive lifestyle modifications today with an emphasis on specific weight loss instructions and strategies.   5. Class 3 severe obesity with serious comorbidity and body mass index (BMI) of 45.0 to 49.9 in adult, unspecified obesity type (HCC) Veronica Norton is currently in the action stage of change. As such, her goal is to continue with weight loss efforts. She has agreed to on the Category 1 Plan.   We discussed the following exercise goals today: For substantial health benefits, adults should do at least 150 minutes (2 hours and 30 minutes) a week of moderate-intensity, or 75 minutes (1 hour and 15 minutes) a week of vigorous-intensity aerobic physical activity, or an equivalent combination of moderate- and vigorous-intensity aerobic activity. Aerobic activity should be performed in episodes of at least 10 minutes, and preferably, it should be spread throughout the week. Adults should also include muscle-strengthening activities that involve all major muscle groups on 2 or more days a week.  We discussed the following behavioral modification strategies today: increasing lean protein intake and increasing water intake.  Veronica Norton has agreed to follow-up with our clinic in 2 weeks. She was informed of the importance of frequent follow-up visits to maximize her success with intensive lifestyle modifications for her multiple health conditions.   Objective:   Blood pressure 129/84, pulse 88, temperature 98.3 F (36.8 C), temperature source Oral, height 5\' 5"  (1.651 m), weight 280 lb (127 kg), last menstrual period 03/13/2019, SpO2 100 %. Body mass index is 46.59 kg/m.  General: Cooperative, alert, well developed, in no acute distress. HEENT: Conjunctivae and lids unremarkable. Neck: No thyromegaly.  Cardiovascular: Regular rhythm.  Lungs: Normal work of breathing. Extremities: No edema.  Neurologic: No focal deficits.   Lab Results  Component Value Date   CREATININE 0.78 01/15/2019    BUN 12 01/15/2019   NA 132 (L) 01/15/2019   K 3.8 01/15/2019   CL 97 01/15/2019   CO2 28 01/15/2019   Lab Results  Component Value Date   ALT 17 01/15/2019   AST 14 01/15/2019   ALKPHOS 72 01/15/2019   BILITOT 0.2 01/15/2019   Lab Results  Component Value Date   HGBA1C 10.9 (H) 01/15/2019   HGBA1C 7.8 (A) 05/22/2018   HGBA1C 9.1 (H) 02/06/2018   HGBA1C 7.5 (A) 10/10/2017   HGBA1C 7.5 (H) 07/03/2017   No results found for: INSULIN Lab Results  Component Value Date   TSH 1.480 02/03/2019   Lab Results  Component Value Date   CHOL 139 01/15/2019   HDL 42.40 01/15/2019   LDLCALC 74 01/15/2019   TRIG 115.0 01/15/2019   CHOLHDL 3 01/15/2019   Lab Results  Component Value Date   WBC 7.8 01/15/2019   HGB 15.2 (H) 01/15/2019   HCT 44.9 01/15/2019   MCV 97.2 01/15/2019   PLT 360.0 01/15/2019   Attestation Statements:   Reviewed by clinician on day of visit: allergies, medications, problem list, medical history, surgical history, family history, social history, and previous encounter notes.  I, Water quality scientist, CMA, am acting as Location manager for [employer], DO.  I have reviewed the above documentation for accuracy and completeness, and I agree with the above. Briscoe Deutscher,  DO

## 2019-03-26 ENCOUNTER — Encounter (INDEPENDENT_AMBULATORY_CARE_PROVIDER_SITE_OTHER): Payer: Self-pay | Admitting: Family Medicine

## 2019-03-27 NOTE — Telephone Encounter (Signed)
Please find out what she uses this for. It has not been refilled in 2 years. Thanks.

## 2019-03-27 NOTE — Telephone Encounter (Signed)
Refilled: 12/15/2016 Last OV: 01/15/2019 Next OV: 05/21/2019

## 2019-03-28 ENCOUNTER — Ambulatory Visit
Admission: RE | Admit: 2019-03-28 | Discharge: 2019-03-28 | Disposition: A | Discharge status: Home / Self Care | Payer: 59 | Source: Ambulatory Visit | Attending: Obstetrics and Gynecology | Admitting: Obstetrics and Gynecology

## 2019-03-28 ENCOUNTER — Ambulatory Visit: Payer: 59

## 2019-03-28 ENCOUNTER — Other Ambulatory Visit: Payer: Self-pay

## 2019-03-28 DIAGNOSIS — N641 Fat necrosis of breast: Secondary | ICD-10-CM

## 2019-03-28 MED ORDER — FLUTICASONE PROPIONATE 0.05 % EX CREA: TOPICAL_CREAM | CUTANEOUS | 0 refills | Status: DC

## 2019-03-28 NOTE — Telephone Encounter (Signed)
Spoke with pt and she stated that she still uses it PRN when she develops a itchy rash. She stated that she has a rash on her neck that itches and she has used this cream before and it helped.

## 2019-04-07 ENCOUNTER — Ambulatory Visit (INDEPENDENT_AMBULATORY_CARE_PROVIDER_SITE_OTHER): Payer: 59 | Admitting: Family Medicine

## 2019-04-07 ENCOUNTER — Other Ambulatory Visit: Payer: Self-pay

## 2019-04-07 ENCOUNTER — Encounter (INDEPENDENT_AMBULATORY_CARE_PROVIDER_SITE_OTHER): Payer: Self-pay | Admitting: Family Medicine

## 2019-04-07 VITALS — BP 129/86 | HR 93 | Temp 98.5°F | Ht 65.0 in | Wt 280.0 lb

## 2019-04-07 DIAGNOSIS — E119 Type 2 diabetes mellitus without complications: Secondary | ICD-10-CM

## 2019-04-07 DIAGNOSIS — Z6841 Body Mass Index (BMI) 40.0 and over, adult: Secondary | ICD-10-CM

## 2019-04-07 DIAGNOSIS — Z9189 Other specified personal risk factors, not elsewhere classified: Secondary | ICD-10-CM | POA: Diagnosis not present

## 2019-04-07 DIAGNOSIS — F3289 Other specified depressive episodes: Secondary | ICD-10-CM

## 2019-04-07 DIAGNOSIS — E559 Vitamin D deficiency, unspecified: Secondary | ICD-10-CM

## 2019-04-07 DIAGNOSIS — F5081 Binge eating disorder: Secondary | ICD-10-CM

## 2019-04-07 NOTE — Progress Notes (Signed)
Chief Complaint:   OBESITY [name] is here to discuss her progress with her obesity treatment plan along with follow-up of her obesity related diagnoses. Veronica Norton is on the Category 1 Plan and states she is following her eating plan approximately 60% of the time. Veronica Norton states she is exercising for 0 minutes 0 times per week.  Today's visit was #: 5 Starting weight: 284 lbs Starting date: 02/03/2019 Today's weight: 280 lbs Today's date: 04/07/2019 Total lbs lost to date: 4 lbs Total lbs lost since last in-office visit: 0  Interim History: [name] endorses increased fatigue, decreased mood, and says she has been disorganized.  Subjective:   1. Type 2 diabetes mellitus uncontrolled, without long-term current use of insulin (HCC) Medications reviewed.  She is taking Trulicity 1.5 mg weekly.  Home glucose monitoring: is not performed. She endorses more fatigue lately.   Lab Results  Component Value Date   HGBA1C 10.9 (H) 01/15/2019   HGBA1C 7.8 (A) 05/22/2018   HGBA1C 9.1 (H) 02/06/2018   Lab Results  Component Value Date   MICROALBUR 0.8 01/15/2019   LDLCALC 74 01/15/2019   CREATININE 0.78 01/15/2019   2. Other depression, with emotional eating Veronica Norton is struggling with emotional eating and using food for comfort to the extent that it is negatively impacting her health. She has been working on behavior modification techniques to help reduce her emotional eating and has been unsuccessful. She shows no sign of suicidal or homicidal ideations.  3. Vitamin D deficiency [name]'s Vitamin D level was 28.2 on 02/03/2019. She is currently taking vit D. She denies nausea, vomiting or muscle weakness.  4. Binge eating disorder with ADD Veronica Norton has symptoms consistent with binge eating disorder with ADD.  5. At risk for dehydration Veronica Norton is at increased risk for dehydration.  Assessment/Plan:   1. Type 2 diabetes mellitus uncontrolled, without long-term current use of insulin (HCC) Good  blood sugar control is important to decrease the likelihood of diabetic complications such as nephropathy, neuropathy, limb loss, blindness, coronary artery disease, and death. Intensive lifestyle modification including diet, exercise and weight loss are the first line of treatment for diabetes.   2. Other depression, with emotional eating Behavior modification techniques were discussed today to help Veronica Norton deal with her emotional/non-hunger eating behaviors.  Orders and follow up as documented in patient record.   3. Vitamin D deficiency Low Vitamin D level contributes to fatigue and are associated with obesity, breast, and colon cancer. She agrees to continue to take prescription Vitamin D @50 ,000 IU every week and will follow-up for routine testing of Vitamin D, at least 2-3 times per year to avoid over-replacement.  Orders - Vitamin D, Ergocalciferol, (DRISDOL) 1.25 MG (50000 UNIT) CAPS capsule; Take 1 capsule (50,000 Units total) by mouth every 7 (seven) days.  Dispense: 4 capsule; Refill: 0  4. Binge eating disorder with ADD Will start the patient on Vyvanse to help with binge eating and ADD.  Orders - lisdexamfetamine (VYVANSE) 20 MG capsule; Take 1 capsule (20 mg total) by mouth daily.  Dispense: 30 capsule; Refill: 0  5. At risk for dehydration Veronica Norton was given approximately 15 minutes dehydration prevention counseling today. Veronica Norton is at risk for dehydration due to weight loss and current medication(s). She was encouraged to hydrate and monitor fluid status to avoid dehydration as well as weight loss plateaus.   6. Class 3 severe obesity with serious comorbidity and body mass index (BMI) of 45.0 to 49.9 in adult, unspecified obesity  type [facility]) Veronica Norton is currently in the action stage of change. As such, her goal is to continue with weight loss efforts. She has agreed to the Category 1 Plan.   Exercise goals: For substantial health benefits, adults should do at least 150 minutes (2 hours  and 30 minutes) a week of moderate-intensity, or 75 minutes (1 hour and 15 minutes) a week of vigorous-intensity aerobic physical activity, or an equivalent combination of moderate- and vigorous-intensity aerobic activity. Aerobic activity should be performed in episodes of at least 10 minutes, and preferably, it should be spread throughout the week. Adults should also include muscle-strengthening activities that involve all major muscle groups on 2 or more days a week.  Behavioral modification strategies: increasing lean protein intake, decreasing simple carbohydrates and increasing vegetables.  Veronica Norton has agreed to follow-up with our clinic in 2 weeks. She was informed of the importance of frequent follow-up visits to maximize her success with intensive lifestyle modifications for her multiple health conditions.   Objective:   Blood pressure 129/86, pulse 93, temperature 98.5 F (36.9 C), temperature source Oral, height 5\' 5"  (1.651 m), weight 280 lb (127 kg), last menstrual period 03/13/2019, SpO2 98 %. Body mass index is 46.59 kg/m.  General: Cooperative, alert, well developed, in no acute distress. HEENT: Conjunctivae and lids unremarkable. Cardiovascular: Regular rhythm.  Lungs: Normal work of breathing. Neurologic: No focal deficits.   Lab Results  Component Value Date   CREATININE 0.78 01/15/2019   BUN 12 01/15/2019   NA 132 (L) 01/15/2019   K 3.8 01/15/2019   CL 97 01/15/2019   CO2 28 01/15/2019   Lab Results  Component Value Date   ALT 17 01/15/2019   AST 14 01/15/2019   ALKPHOS 72 01/15/2019   BILITOT 0.2 01/15/2019   Lab Results  Component Value Date   HGBA1C 10.9 (H) 01/15/2019   HGBA1C 7.8 (A) 05/22/2018   HGBA1C 9.1 (H) 02/06/2018   HGBA1C 7.5 (A) 10/10/2017   HGBA1C 7.5 (H) 07/03/2017   No results found for: INSULIN Lab Results  Component Value Date   TSH 1.480 02/03/2019   Lab Results  Component Value Date   CHOL 139 01/15/2019   HDL 42.40 01/15/2019    LDLCALC 74 01/15/2019   TRIG 115.0 01/15/2019   CHOLHDL 3 01/15/2019   Lab Results  Component Value Date   WBC 7.8 01/15/2019   HGB 15.2 (H) 01/15/2019   HCT 44.9 01/15/2019   MCV 97.2 01/15/2019   PLT 360.0 01/15/2019   Attestation Statements:   Reviewed by clinician on day of visit: allergies, medications, problem list, medical history, surgical history, family history, social history, and previous encounter notes.  I, Water quality scientist, CMA, am acting as Location manager for [employer], DO.  I have reviewed the above documentation for accuracy and completeness, and I agree with the above. Briscoe Deutscher, DO

## 2019-04-09 ENCOUNTER — Encounter (INDEPENDENT_AMBULATORY_CARE_PROVIDER_SITE_OTHER): Payer: Self-pay | Admitting: Family Medicine

## 2019-04-09 MED ORDER — LISDEXAMFETAMINE DIMESYLATE 20 MG PO CAPS: 20.0000 mg | ORAL_CAPSULE | Freq: Every day | ORAL | 0 refills | Status: DC

## 2019-04-09 MED ORDER — VITAMIN D (ERGOCALCIFEROL) 1.25 MG (50000 UNIT) PO CAPS: 50000.0000 [IU] | ORAL_CAPSULE | ORAL | 0 refills | Status: DC

## 2019-04-16 ENCOUNTER — Encounter (INDEPENDENT_AMBULATORY_CARE_PROVIDER_SITE_OTHER): Payer: Self-pay

## 2019-04-17 ENCOUNTER — Ambulatory Visit: Payer: 59 | Admitting: Pharmacist

## 2019-04-17 DIAGNOSIS — E1169 Type 2 diabetes mellitus with other specified complication: Secondary | ICD-10-CM

## 2019-04-17 DIAGNOSIS — E119 Type 2 diabetes mellitus without complications: Secondary | ICD-10-CM

## 2019-04-17 MED ORDER — METFORMIN HCL ER 500 MG PO TB24: 1000.0000 mg | ORAL_TABLET | Freq: Two times a day (BID) | ORAL | 3 refills | Status: DC

## 2019-04-17 MED ORDER — FREESTYLE LIBRE 14 DAY SENSOR MISC: 1.0000 | 3 refills | Status: DC

## 2019-04-17 NOTE — Chronic Care Management (AMB) (Signed)
Chronic Care Management   Follow Up Note   04/17/2019 Name: Veronica Norton MRN: [id number] DOB: Jul 19, 1975  Referred by: Leone Haven, MD Reason for referral : Chronic Care Management (Medication Management)   Veronica Norton is a 44 y.o. year old female who is a primary care patient of Caryl Bis, Angela Adam, MD. The CCM team was consulted for assistance with chronic disease management and care coordination needs.   Contacted patient for medication management review.   Review of patient status, including review of consultants reports, relevant laboratory and other test results, and collaboration with appropriate care team members and the patient's provider was performed as part of comprehensive patient evaluation and provision of chronic care management services.    SDOH (Social Determinants of Health) screening performed today: Stress. See Care Plan for related entries.   Outpatient Encounter Medications as of 04/17/2019  Medication Sig  . blood glucose meter kit and supplies KIT Dispense based on patient and insurance preference. Use fasting, prior to lunch and prior to dinner. (FOR ICD-10 E11.9)  . Dulaglutide (TRULICITY) 1.5 ZS/0.1UX SOPN Inject 1.5 mg into the skin once a week.  Marland Kitchen glucose blood (FREESTYLE TEST STRIPS) test strip Check sugar bid  . glucose monitoring kit (FREESTYLE) monitoring kit Use as directed to check sugars bid  . hydrochlorothiazide (MICROZIDE) 12.5 MG capsule TAKE 1 CAPSULE BY MOUTH EVERY DAY  . Lancets (ONETOUCH ULTRASOFT) lancets Use as instructed  . metFORMIN (GLUCOPHAGE-XR) 500 MG 24 hr tablet Take 2 tablets (1,000 mg total) by mouth 2 (two) times daily.  . norethindrone (CAMILA) 0.35 MG tablet Take 1 tablet (0.35 mg total) by mouth daily.  . Vitamin D, Ergocalciferol, (DRISDOL) 1.25 MG (50000 UNIT) CAPS capsule Take 1 capsule (50,000 Units total) by mouth every 7 (seven) days.  . [DISCONTINUED] metFORMIN (GLUCOPHAGE-XR) 500 MG 24 hr tablet TAKE  2 TABLETS BY MOUTH EVERY DAY WITH BREAKFAST  . APPLE CIDER VINEGAR PO Take by mouth. 2-3 gummies 2 times per day  . Ascorbic Acid (VITAMIN C ADULT GUMMIES PO) Take by mouth.  . Continuous Blood Gluc Sensor (FREESTYLE LIBRE 14 DAY SENSOR) MISC Inject 1 Device into the skin every 14 (fourteen) days.  Marland Kitchen ELDERBERRY PO Take by mouth. 1 gummy a day  . fluticasone (CUTIVATE) 0.05 % cream APPLY TO AFFECTED AREA UP TO TWICE A DAY AS NEEDED FOR RASH/ITCHING  . lisdexamfetamine (VYVANSE) 20 MG capsule Take 1 capsule (20 mg total) by mouth daily. (Patient not taking: Reported on 04/17/2019)   No facility-administered encounter medications on file as of 04/17/2019.     Objective:   Goals Addressed            This Visit's Progress     Patient Stated   . "I want to get my sugars better under control" (pt-stated)       Current Barriers:  . Diabetes: uncontrolled; most recent A1c 10.9%, DUE for A1c . Current antihyperglycemic regimen: metformin 3235 mg BID, Trulicity 1.5 mg weekly  . Current meal patterns o Breakfast: bagel thins; greek protein cream cheese; [place] sausage x5; cup of peaches o Sometimes she eats breakfast late, so above counts as breakfast and lunch o [employer] bread soup o Dinner: generally does quick things; had chicken and tater tots the other day  o Drinking more water lately  . Current blood glucose readings: not checking, notes she doesn't like sticking herself, she has picked up the meter but has not started using yet. Notes that some people  in her immediate life do not know she has diabetes. . Cardiovascular risk reduction: o Current hypertensive regimen: HCTZ 12.5 mg daily;  o Current hyperlipidemia regimen: none; 10 year ASCVD risk 3.8%; however, patient contemplating pregnancy in the future  Pharmacist Clinical Goal(s):  Marland Kitchen Over the next 90 days, patient will work with PharmD and primary care provider to address optimized glycemic management  Interventions: . Comprehensive  medication review performed, medication list updated in electronic medical record . Provided empathetic listening as patient discussed being tired of worrying about her health.  . Reviewed micro and macrovascular complications of uncontrolled diabetes.  . Suggested FreeStyle Libre to relieve "finger stick" burden. Patient skeptical, as she doesn't want people to see her wearing the sensor and think something is wrong with her. Discussed placement sites under shirt sleeves. Sent prescription to the pharmacy. Patient will research CGM, will see if covered on her insurance, and contemplate. Discussed downloading LibreLink app on her phone. . Increased prescription for metformin to 1000 mg BID as patient is currently taking.   Patient Self Care Activities:  . Patient will check use CGM to check glucose, document, and provide at future appointments . Patient will take medications as prescribed . Patient will report any questions or concerns to provider   Please see past updates related to this goal by clicking on the "Past Updates" button in the selected goal          Plan:  - Scheduled f/u call 06/02/19  Catie Darnelle Maffucci, PharmD, BCACP, [place] Pharmacist [place] [phone]

## 2019-04-17 NOTE — Patient Instructions (Signed)
Visit Information  Goals Addressed            This Visit's Progress     Patient Stated   . "I want to get my sugars better under control" (pt-stated)       Current Barriers:  . Diabetes: uncontrolled; most recent A1c 10.9%, DUE for A1c . Current antihyperglycemic regimen: metformin 123XX123 mg BID, Trulicity 1.5 mg weekly  . Current meal patterns o Breakfast: bagel thins; greek protein cream cheese; [place] sausage x5; cup of peaches o Sometimes she eats breakfast late, so above counts as breakfast and lunch o [employer] bread soup o Dinner: generally does quick things; had chicken and tater tots the other day  o Drinking more water lately  . Current blood glucose readings: not checking, notes she doesn't like sticking herself, she has picked up the meter but has not started using yet. Notes that some people in her immediate life do not know she has diabetes. . Cardiovascular risk reduction: o Current hypertensive regimen: HCTZ 12.5 mg daily;  o Current hyperlipidemia regimen: none; 10 year ASCVD risk 3.8%; however, patient contemplating pregnancy in the future  Pharmacist Clinical Goal(s):  Marland Kitchen Over the next 90 days, patient will work with PharmD and primary care provider to address optimized glycemic management  Interventions: . Comprehensive medication review performed, medication list updated in electronic medical record . Provided empathetic listening as patient discussed being tired of worrying about her health.  . Reviewed micro and macrovascular complications of uncontrolled diabetes.  . Suggested FreeStyle Libre to relieve "finger stick" burden. Patient skeptical, as she doesn't want people to see her wearing the sensor and think something is wrong with her. Discussed placement sites under shirt sleeves. Sent prescription to the pharmacy. Patient will research CGM, will see if covered on her insurance, and contemplate. Discussed downloading LibreLink app on her phone. . Increased  prescription for metformin to 1000 mg BID as patient is currently taking.   Patient Self Care Activities:  . Patient will check use CGM to check glucose, document, and provide at future appointments . Patient will take medications as prescribed . Patient will report any questions or concerns to provider   Please see past updates related to this goal by clicking on the "Past Updates" button in the selected goal         The patient verbalized understanding of instructions provided today and declined a print copy of patient instruction materials.   Plan:  - Scheduled f/u call 06/02/19  Catie Darnelle Maffucci, PharmD, BCACP, CPP Clinical Pharmacist [place] [phone]

## 2019-04-21 ENCOUNTER — Other Ambulatory Visit: Payer: Self-pay

## 2019-04-21 ENCOUNTER — Encounter (INDEPENDENT_AMBULATORY_CARE_PROVIDER_SITE_OTHER): Payer: Self-pay | Admitting: Family Medicine

## 2019-04-21 ENCOUNTER — Ambulatory Visit (INDEPENDENT_AMBULATORY_CARE_PROVIDER_SITE_OTHER): Payer: 59 | Admitting: Family Medicine

## 2019-04-21 VITALS — BP 126/75 | HR 67 | Temp 98.2°F | Ht 65.0 in | Wt 280.0 lb

## 2019-04-21 DIAGNOSIS — F5081 Binge eating disorder: Secondary | ICD-10-CM | POA: Diagnosis not present

## 2019-04-21 DIAGNOSIS — F3289 Other specified depressive episodes: Secondary | ICD-10-CM | POA: Diagnosis not present

## 2019-04-21 DIAGNOSIS — Z9189 Other specified personal risk factors, not elsewhere classified: Secondary | ICD-10-CM

## 2019-04-21 DIAGNOSIS — E119 Type 2 diabetes mellitus without complications: Secondary | ICD-10-CM

## 2019-04-21 DIAGNOSIS — E559 Vitamin D deficiency, unspecified: Secondary | ICD-10-CM

## 2019-04-21 DIAGNOSIS — F50819 Binge eating disorder, unspecified: Secondary | ICD-10-CM

## 2019-04-21 DIAGNOSIS — Z6841 Body Mass Index (BMI) 40.0 and over, adult: Secondary | ICD-10-CM

## 2019-04-21 NOTE — Progress Notes (Signed)
Chief Complaint:   OBESITY Veronica Norton is here to discuss her progress with her obesity treatment plan along with follow-up of her obesity related diagnoses. Veronica Norton is on the Category 1 Plan and states she is following her eating plan approximately 60% of the time. Veronica Norton states she is walking and dancing for 10-15 minutes 2-3 times per week.  Today's visit was #: 6 Starting weight: 284 lbs Starting date: 02/03/2019 Today's weight: 280 lbs Today's date: 04/21/2019 Total lbs lost to date: 4 lbs Total lbs lost since last in-office visit: 0  Interim History: Veronica Norton has been doing a lot of negative self-talk.  She has a [birth year] coming up.  Subjective:   1. Type 2 diabetes mellitus without complication, without long-term current use of insulin (HCC) Medications reviewed. She takes metformin and Trulicity.    Lab Results  Component Value Date   HGBA1C 10.9 (H) 01/15/2019   HGBA1C 7.8 (A) 05/22/2018   HGBA1C 9.1 (H) 02/06/2018   Lab Results  Component Value Date   MICROALBUR 0.8 01/15/2019   LDLCALC 74 01/15/2019   CREATININE 0.78 01/15/2019   2. Vitamin D deficiency Veronica Norton's Vitamin D level was 28.2 on 02/03/2019. She is currently taking vit D. She denies nausea, vomiting or muscle weakness.  3. Binge eating disorder with ADD Veronica Norton has not started taking Vyvanse yet.  4. Other depression, with emotional eating Veronica Norton is struggling with emotional eating and using food for comfort to the extent that it is negatively impacting her health. She has been working on behavior modification techniques to help reduce her emotional eating and has been unsuccessful. She shows no sign of suicidal or homicidal ideations.  5. At risk for constipation Veronica Norton is at increased risk for constipation due to inadequate water intake, changes in diet, and/or use of medications such as GLP1 agonists. Veronica Norton denies hard, infrequent stools currently.   Assessment/Plan:   1. Type 2 diabetes mellitus without  complication, without long-term current use of insulin (HCC) Good blood sugar control is important to decrease the likelihood of diabetic complications such as nephropathy, neuropathy, limb loss, blindness, coronary artery disease, and death. Intensive lifestyle modification including diet, exercise and weight loss are the first line of treatment for diabetes.   2. Vitamin D deficiency Low Vitamin D level contributes to fatigue and are associated with obesity, breast, and colon cancer. She agrees to continue to take prescription Vitamin D @50 ,000 IU every week and will follow-up for routine testing of Vitamin D, at least 2-3 times per year to avoid over-replacement.  3. Binge eating disorder with ADD Veronica Norton will start taking Vyvanse this week.  4. Other depression, with emotional eating Behavior modification techniques were discussed today to help Travia deal with her emotional/non-hunger eating behaviors.  Orders and follow up as documented in patient record.   5. At risk for constipation Veronica Norton was given approximately 15 minutes of counseling today regarding prevention of constipation. She was encouraged to increase water and fiber intake.   6. Class 3 severe obesity with serious comorbidity and body mass index (BMI) of 45.0 to 49.9 in adult, unspecified obesity type (HCC) Veronica Norton is currently in the action stage of change. As such, her goal is to continue with weight loss efforts. She has agreed to the Category 1 Plan.   Exercise goals: For substantial health benefits, adults should do at least 150 minutes (2 hours and 30 minutes) a week of moderate-intensity, or 75 minutes (1 hour and 15 minutes) a week of  vigorous-intensity aerobic physical activity, or an equivalent combination of moderate- and vigorous-intensity aerobic activity. Aerobic activity should be performed in episodes of at least 10 minutes, and preferably, it should be spread throughout the week. Adults should also include  muscle-strengthening activities that involve all major muscle groups on 2 or more days a week.  Behavioral modification strategies: increasing lean protein intake and increasing water intake.  Practice gratitude.  Veronica Norton has agreed to follow-up with our clinic in 2 weeks. She was informed of the importance of frequent follow-up visits to maximize her success with intensive lifestyle modifications for her multiple health conditions.   Objective:   Blood pressure 126/75, pulse 67, temperature 98.2 F (36.8 C), temperature source Oral, height 5\' 5"  (1.651 m), weight 280 lb (127 kg), last menstrual period 04/16/2019, SpO2 96 %. Body mass index is 46.59 kg/m.  General: Cooperative, alert, well developed, in no acute distress. HEENT: Conjunctivae and lids unremarkable. Cardiovascular: Regular rhythm.  Lungs: Normal work of breathing. Neurologic: No focal deficits.   Lab Results  Component Value Date   CREATININE 0.78 01/15/2019   BUN 12 01/15/2019   NA 132 (L) 01/15/2019   K 3.8 01/15/2019   CL 97 01/15/2019   CO2 28 01/15/2019   Lab Results  Component Value Date   ALT 17 01/15/2019   AST 14 01/15/2019   ALKPHOS 72 01/15/2019   BILITOT 0.2 01/15/2019   Lab Results  Component Value Date   HGBA1C 10.9 (H) 01/15/2019   HGBA1C 7.8 (A) 05/22/2018   HGBA1C 9.1 (H) 02/06/2018   HGBA1C 7.5 (A) 10/10/2017   HGBA1C 7.5 (H) 07/03/2017   Lab Results  Component Value Date   TSH 1.480 02/03/2019   Lab Results  Component Value Date   CHOL 139 01/15/2019   HDL 42.40 01/15/2019   LDLCALC 74 01/15/2019   TRIG 115.0 01/15/2019   CHOLHDL 3 01/15/2019   Lab Results  Component Value Date   WBC 7.8 01/15/2019   HGB 15.2 (H) 01/15/2019   HCT 44.9 01/15/2019   MCV 97.2 01/15/2019   PLT 360.0 01/15/2019   Attestation Statements:   Reviewed by clinician on day of visit: allergies, medications, problem list, medical history, surgical history, family history, social history, and  previous encounter notes.  I, Water quality scientist, CMA, am acting as Location manager for [employer], DO.  I have reviewed the above documentation for accuracy and completeness, and I agree with the above. Briscoe Deutscher, DO

## 2019-05-05 ENCOUNTER — Ambulatory Visit (INDEPENDENT_AMBULATORY_CARE_PROVIDER_SITE_OTHER): Payer: 59 | Admitting: Family Medicine

## 2019-05-05 ENCOUNTER — Other Ambulatory Visit: Payer: Self-pay

## 2019-05-05 ENCOUNTER — Encounter (INDEPENDENT_AMBULATORY_CARE_PROVIDER_SITE_OTHER): Payer: Self-pay | Admitting: Family Medicine

## 2019-05-05 VITALS — BP 130/85 | HR 92 | Temp 98.3°F | Ht 65.0 in | Wt 282.0 lb

## 2019-05-05 DIAGNOSIS — E559 Vitamin D deficiency, unspecified: Secondary | ICD-10-CM | POA: Diagnosis not present

## 2019-05-05 DIAGNOSIS — F5081 Binge eating disorder: Secondary | ICD-10-CM | POA: Diagnosis not present

## 2019-05-05 DIAGNOSIS — E119 Type 2 diabetes mellitus without complications: Secondary | ICD-10-CM | POA: Diagnosis not present

## 2019-05-05 DIAGNOSIS — Z6841 Body Mass Index (BMI) 40.0 and over, adult: Secondary | ICD-10-CM

## 2019-05-05 DIAGNOSIS — F3289 Other specified depressive episodes: Secondary | ICD-10-CM

## 2019-05-05 MED ORDER — VITAMIN D (ERGOCALCIFEROL) 1.25 MG (50000 UNIT) PO CAPS: 50000.0000 [IU] | ORAL_CAPSULE | ORAL | 0 refills | Status: DC

## 2019-05-05 MED ORDER — LISDEXAMFETAMINE DIMESYLATE 20 MG PO CAPS: 20.0000 mg | ORAL_CAPSULE | Freq: Every day | ORAL | 0 refills | Status: DC

## 2019-05-05 MED ORDER — TRULICITY 1.5 MG/0.5ML ~~LOC~~ SOAJ: 1.5000 mg | SUBCUTANEOUS | 0 refills | Status: DC

## 2019-05-05 NOTE — Progress Notes (Signed)
Chief Complaint:   OBESITY Veronica Norton is here to discuss her progress with her obesity treatment plan along with follow-up of her obesity related diagnoses. Veronica Norton is on the Category 1 Plan and states she is following her eating plan approximately 60-75% of the time. [name] states she is exercising for 0 minutes 0 times per week.  Today's visit was #: 7 Starting weight: 284 lbs Starting date: 02/03/2019 Today's weight: 282 lbs Today's date: 05/05/2019 Total lbs lost to date: 2 lbs Total lbs lost since last in-office visit: 0  Interim History: [name] reports that she has no appetite sometimes.  She says she feels bloated.  Subjective:   1. Binge eating disorder with ADD [name] is taking Vyvanse 20 mg daily for binge eating disorder with ADD.  2. Type 2 diabetes mellitus without complication, without long-term current use of insulin (HCC) Medications reviewed. Veronica Norton is taking metformin 1000 mg twice daily and Trulicity.  She checks her blood sugar regularly.  Lab Results  Component Value Date   HGBA1C 10.9 (H) 01/15/2019   HGBA1C 7.8 (A) 05/22/2018   HGBA1C 9.1 (H) 02/06/2018   Lab Results  Component Value Date   MICROALBUR 0.8 01/15/2019   LDLCALC 74 01/15/2019   CREATININE 0.78 01/15/2019   3. Vitamin D deficiency Veronica Norton's Vitamin D level was 28.2 on 02/03/2019. She is currently taking vit D. She denies nausea, vomiting or muscle weakness.  4. Other depression, with emotional eating Veronica Norton is struggling with emotional eating and using food for comfort to the extent that it is negatively impacting her health. She has been working on behavior modification techniques to help reduce her emotional eating and has been unsuccessful. She shows no sign of suicidal or homicidal ideations.    Assessment/Plan:   1. Binge eating disorder with ADD Will check labs and refill Vyvanse today.  Orders - Vitamin B12 - Pregnancy, urine - lisdexamfetamine (VYVANSE) 20 MG capsule; Take 1  capsule (20 mg total) by mouth daily.  Dispense: 30 capsule; Refill: 0  2. Type 2 diabetes mellitus without complication, without long-term current use of insulin (HCC) Good blood sugar control is important to decrease the likelihood of diabetic complications such as nephropathy, neuropathy, limb loss, blindness, coronary artery disease, and death. Intensive lifestyle modification including diet, exercise and weight loss are the first line of treatment for diabetes.   Orders - Comprehensive metabolic panel - CBC with Differential/Platelet - Hemoglobin A1c - Lipid panel - Dulaglutide (TRULICITY) 1.5 0000000 SOPN; Inject 1.5 mg into the skin once a week.  Dispense: 4 pen; Refill: 0  3. Vitamin D deficiency Low Vitamin D level contributes to fatigue and are associated with obesity, breast, and colon cancer. She agrees to continue to take prescription Vitamin D @50 ,000 IU every week and will follow-up for routine testing of Vitamin D, at least 2-3 times per year to avoid over-replacement.  Orders - VITAMIN D 25 Hydroxy (Vit-D Deficiency, Fractures) - Vitamin D, Ergocalciferol, (DRISDOL) 1.25 MG (50000 UNIT) CAPS capsule; Take 1 capsule (50,000 Units total) by mouth every 7 (seven) days.  Dispense: 4 capsule; Refill: 0  4. Other depression, with emotional eating Behavior modification techniques were discussed today to help Itsel deal with her emotional/non-hunger eating behaviors.  Orders and follow up as documented in patient record.   5. Class 3 severe obesity with serious comorbidity and body mass index (BMI) of 45.0 to 49.9 in adult, unspecified obesity type (HCC) Veronica Norton is currently in the action stage of change.  As such, her goal is to continue with weight loss efforts. She has agreed to the Category 1 Plan.   Exercise goals: For substantial health benefits, adults should do at least 150 minutes (2 hours and 30 minutes) a week of moderate-intensity, or 75 minutes (1 hour and 15 minutes) a  week of vigorous-intensity aerobic physical activity, or an equivalent combination of moderate- and vigorous-intensity aerobic activity. Aerobic activity should be performed in episodes of at least 10 minutes, and preferably, it should be spread throughout the week.  Behavioral modification strategies: increasing lean protein intake and increasing water intake.  Veronica Norton has agreed to follow-up with our clinic in 2 weeks. She was informed of the importance of frequent follow-up visits to maximize her success with intensive lifestyle modifications for her multiple health conditions.   Veronica Norton was informed we would discuss her lab results at her next visit unless there is a critical issue that needs to be addressed sooner. Veronica Norton agreed to keep her next visit at the agreed upon time to discuss these results.  Objective:   Blood pressure 130/85, pulse 92, temperature 98.3 F (36.8 C), temperature source Oral, height 5\' 5"  (1.651 m), weight 282 lb (127.9 kg), last menstrual period 04/16/2019, SpO2 98 %. Body mass index is 46.93 kg/m.  General: Cooperative, alert, well developed, in no acute distress. HEENT: Conjunctivae and lids unremarkable. Cardiovascular: Regular rhythm.  Lungs: Normal work of breathing. Neurologic: No focal deficits.   Lab Results  Component Value Date   CREATININE 0.78 01/15/2019   BUN 12 01/15/2019   NA 132 (L) 01/15/2019   K 3.8 01/15/2019   CL 97 01/15/2019   CO2 28 01/15/2019   Lab Results  Component Value Date   ALT 17 01/15/2019   AST 14 01/15/2019   ALKPHOS 72 01/15/2019   BILITOT 0.2 01/15/2019   Lab Results  Component Value Date   HGBA1C 10.9 (H) 01/15/2019   HGBA1C 7.8 (A) 05/22/2018   HGBA1C 9.1 (H) 02/06/2018   HGBA1C 7.5 (A) 10/10/2017   HGBA1C 7.5 (H) 07/03/2017   No results found for: INSULIN Lab Results  Component Value Date   TSH 1.480 02/03/2019   Lab Results  Component Value Date   CHOL 139 01/15/2019   HDL 42.40 01/15/2019    LDLCALC 74 01/15/2019   TRIG 115.0 01/15/2019   CHOLHDL 3 01/15/2019   Lab Results  Component Value Date   WBC 7.8 01/15/2019   HGB 15.2 (H) 01/15/2019   HCT 44.9 01/15/2019   MCV 97.2 01/15/2019   PLT 360.0 01/15/2019   Attestation Statements:   Reviewed by clinician on day of visit: allergies, medications, problem list, medical history, surgical history, family history, social history, and previous encounter notes.  I, Water quality scientist, CMA, am acting as Location manager for [employer], DO.  I have reviewed the above documentation for accuracy and completeness, and I agree with the above. Briscoe Deutscher, DO

## 2019-05-06 ENCOUNTER — Encounter (INDEPENDENT_AMBULATORY_CARE_PROVIDER_SITE_OTHER): Payer: Self-pay | Admitting: Family Medicine

## 2019-05-06 LAB — CBC WITH DIFFERENTIAL/PLATELET
Basophils Absolute: 0 10*3/uL (ref 0.0–0.2)
Basos: 1 %
EOS (ABSOLUTE): 0.1 10*3/uL (ref 0.0–0.4)
Eos: 2 %
Hematocrit: 42.8 % (ref 34.0–46.6)
Hemoglobin: 14.9 g/dL (ref 11.1–15.9)
Immature Grans (Abs): 0 10*3/uL (ref 0.0–0.1)
Immature Granulocytes: 0 %
Lymphocytes Absolute: 2.2 10*3/uL (ref 0.7–3.1)
Lymphs: 27 %
MCH: 33.3 pg — ABNORMAL HIGH (ref 26.6–33.0)
MCHC: 34.8 g/dL (ref 31.5–35.7)
MCV: 96 fL (ref 79–97)
Monocytes Absolute: 0.6 10*3/uL (ref 0.1–0.9)
Monocytes: 7 %
Neutrophils Absolute: 5 10*3/uL (ref 1.4–7.0)
Neutrophils: 63 %
Platelets: 432 10*3/uL (ref 150–450)
RBC: 4.47 x10E6/uL (ref 3.77–5.28)
RDW: 12.5 % (ref 11.7–15.4)
WBC: 7.9 10*3/uL (ref 3.4–10.8)

## 2019-05-06 LAB — COMPREHENSIVE METABOLIC PANEL
ALT: 14 IU/L (ref 0–32)
AST: 17 IU/L (ref 0–40)
Albumin/Globulin Ratio: 1.2 (ref 1.2–2.2)
Albumin: 4.1 g/dL (ref 3.8–4.8)
Alkaline Phosphatase: 86 IU/L (ref 39–117)
BUN/Creatinine Ratio: 14 (ref 9–23)
BUN: 10 mg/dL (ref 6–24)
Bilirubin Total: 0.2 mg/dL (ref 0.0–1.2)
CO2: 23 mmol/L (ref 20–29)
Calcium: 9.1 mg/dL (ref 8.7–10.2)
Chloride: 102 mmol/L (ref 96–106)
Creatinine, Ser: 0.73 mg/dL (ref 0.57–1.00)
GFR calc Af Amer: 117 mL/min/{1.73_m2} (ref >59)
GFR calc non Af Amer: 102 mL/min/{1.73_m2} (ref >59)
Globulin, Total: 3.3 g/dL (ref 1.5–4.5)
Glucose: 140 mg/dL — ABNORMAL HIGH (ref 65–99)
Potassium: 4.6 mmol/L (ref 3.5–5.2)
Sodium: 138 mmol/L (ref 134–144)
Total Protein: 7.4 g/dL (ref 6.0–8.5)

## 2019-05-06 LAB — PREGNANCY, URINE: Preg Test, Ur: NEGATIVE

## 2019-05-06 LAB — LIPID PANEL
Chol/HDL Ratio: 2.5 ratio (ref 0.0–4.4)
Cholesterol, Total: 118 mg/dL (ref 100–199)
HDL: 48 mg/dL (ref >39)
LDL Chol Calc (NIH): 56 mg/dL (ref 0–99)
Triglycerides: 65 mg/dL (ref 0–149)
VLDL Cholesterol Cal: 14 mg/dL (ref 5–40)

## 2019-05-06 LAB — HEMOGLOBIN A1C
Est. average glucose Bld gHb Est-mCnc: 163 mg/dL
Hgb A1c MFr Bld: 7.3 % — ABNORMAL HIGH (ref 4.8–5.6)

## 2019-05-06 LAB — VITAMIN D 25 HYDROXY (VIT D DEFICIENCY, FRACTURES): Vit D, 25-Hydroxy: 40.6 ng/mL (ref 30.0–100.0)

## 2019-05-06 LAB — VITAMIN B12: Vitamin B-12: 936 pg/mL (ref 232–1245)

## 2019-05-07 NOTE — Telephone Encounter (Signed)
Please advise 

## 2019-05-08 ENCOUNTER — Other Ambulatory Visit: Payer: Self-pay | Admitting: Family Medicine

## 2019-05-08 MED ORDER — TRULICITY 3 MG/0.5ML ~~LOC~~ SOAJ: 3.0000 mg | SUBCUTANEOUS | 1 refills | Status: DC

## 2019-05-19 ENCOUNTER — Ambulatory Visit (INDEPENDENT_AMBULATORY_CARE_PROVIDER_SITE_OTHER): Payer: 59 | Admitting: Family Medicine

## 2019-05-19 ENCOUNTER — Other Ambulatory Visit: Payer: Self-pay

## 2019-05-19 ENCOUNTER — Encounter (INDEPENDENT_AMBULATORY_CARE_PROVIDER_SITE_OTHER): Payer: Self-pay | Admitting: Family Medicine

## 2019-05-19 VITALS — BP 122/83 | HR 94 | Temp 98.1°F | Ht 65.0 in | Wt 280.0 lb

## 2019-05-19 DIAGNOSIS — E559 Vitamin D deficiency, unspecified: Secondary | ICD-10-CM | POA: Diagnosis not present

## 2019-05-19 DIAGNOSIS — E119 Type 2 diabetes mellitus without complications: Secondary | ICD-10-CM

## 2019-05-19 DIAGNOSIS — Z6841 Body Mass Index (BMI) 40.0 and over, adult: Secondary | ICD-10-CM

## 2019-05-19 DIAGNOSIS — F3289 Other specified depressive episodes: Secondary | ICD-10-CM

## 2019-05-20 ENCOUNTER — Encounter (INDEPENDENT_AMBULATORY_CARE_PROVIDER_SITE_OTHER): Payer: Self-pay | Admitting: Family Medicine

## 2019-05-20 MED ORDER — VITAMIN D (ERGOCALCIFEROL) 1.25 MG (50000 UNIT) PO CAPS: 50000.0000 [IU] | ORAL_CAPSULE | ORAL | 0 refills | Status: DC

## 2019-05-20 MED ORDER — METFORMIN HCL ER 500 MG PO TB24: 1000.0000 mg | ORAL_TABLET | Freq: Two times a day (BID) | ORAL | 0 refills | Status: DC

## 2019-05-20 MED ORDER — TRULICITY 3 MG/0.5ML ~~LOC~~ SOAJ: 3.0000 mg | SUBCUTANEOUS | 1 refills | Status: DC

## 2019-05-20 NOTE — Telephone Encounter (Signed)
FYI

## 2019-05-20 NOTE — Progress Notes (Signed)
Chief Complaint:   OBESITY Veronica Norton is here to discuss her progress with her obesity treatment plan along with follow-up of her obesity related diagnoses. Veronica Norton is on the Category 1 Plan and states she is following her eating plan approximately 60% of the time. [name] states she is more mindful with walking and getting her steps.  Today's visit was #: 8 Starting weight: 284 lbs Starting date: 02/03/2019 Today's weight: 280 lbs Today's date: 05/19/2019 Total lbs lost to date: 4 lbs Total lbs lost since last in-office visit: 2 lbs  Interim History:   Subjective:   1. Type 2 diabetes mellitus without complication, without long-term current use of insulin (HCC) Medications reviewed. [name] is taking Trulicity and metformin.  Her A1c has improved.  Lab Results  Component Value Date   HGBA1C 7.3 (H) 05/05/2019   HGBA1C 10.9 (H) 01/15/2019   HGBA1C 7.8 (A) 05/22/2018   Lab Results  Component Value Date   MICROALBUR 0.8 01/15/2019   LDLCALC 56 05/05/2019   CREATININE 0.73 05/05/2019   2. Vitamin D deficiency Lynessa's Vitamin D level was 40.6 on 05/05/2019. She is currently taking vit D. She denies nausea, vomiting or muscle weakness.  3. Other depression, with emotional eating Veronica Norton is struggling with emotional eating and using food for comfort to the extent that it is negatively impacting her health. She has been working on behavior modification techniques to help reduce her emotional eating and has been unsuccessful. She shows no sign of suicidal or homicidal ideations.  Veronica Norton has signs of binge eating disorder and ADHD.    Assessment/Plan:   1. Type 2 diabetes mellitus without complication, without long-term current use of insulin (HCC) Good blood sugar control is important to decrease the likelihood of diabetic complications such as nephropathy, neuropathy, limb loss, blindness, coronary artery disease, and death. Intensive lifestyle modification including diet, exercise and weight  loss are the first line of treatment for diabetes.   Orders - metFORMIN (GLUCOPHAGE-XR) 500 MG 24 hr tablet; Take 2 tablets (1,000 mg total) by mouth 2 (two) times daily.  Dispense: 180 tablet; Refill: 0  2. Vitamin D deficiency Low Vitamin D level contributes to fatigue and are associated with obesity, breast, and colon cancer. She agrees to continue to take prescription Vitamin D @50 ,000 IU every week and will follow-up for routine testing of Vitamin D, at least 2-3 times per year to avoid over-replacement.  Orders - Dulaglutide (TRULICITY) 3 0000000 SOPN; Inject 3 mg into the skin once a week.  Dispense: 4 pen; Refill: 1 - Vitamin D, Ergocalciferol, (DRISDOL) 1.25 MG (50000 UNIT) CAPS capsule; Take 1 capsule (50,000 Units total) by mouth every 7 (seven) days.  Dispense: 4 capsule; Refill: 0  3. Other depression, with emotional eating Behavior modification techniques were discussed today to help Veronica Norton deal with her emotional/non-hunger eating behaviors.  Orders and follow up as documented in patient record.  Vyvanse is still causing her to feel jittery and irritable.  Will stop and monitor.  Provided information for therapy.  4. Class 3 severe obesity with serious comorbidity and body mass index (BMI) of 45.0 to 49.9 in adult, unspecified obesity type (HCC) [name] is currently in the action stage of change. As such, her goal is to continue with weight loss efforts. She has agreed to the Category 1 Plan.   Exercise goals: As is.  Behavioral modification strategies: increasing lean protein intake, increasing water intake, emotional eating strategies and dealing with family or coworker sabotage.  Veronica Norton has agreed to follow-up with our clinic in 2 weeks. She was informed of the importance of frequent follow-up visits to maximize her success with intensive lifestyle modifications for her multiple health conditions.   Objective:   Blood pressure 122/83, pulse 94, temperature 98.1 F (36.7 C),  temperature source Oral, height 5\' 5"  (1.651 m), weight 280 lb (127 kg), SpO2 97 %. Body mass index is 46.59 kg/m.  General: Cooperative, alert, well developed, in no acute distress. HEENT: Conjunctivae and lids unremarkable. Cardiovascular: Regular rhythm.  Lungs: Normal work of breathing. Neurologic: No focal deficits.   Lab Results  Component Value Date   CREATININE 0.73 05/05/2019   BUN 10 05/05/2019   NA 138 05/05/2019   K 4.6 05/05/2019   CL 102 05/05/2019   CO2 23 05/05/2019   Lab Results  Component Value Date   ALT 14 05/05/2019   AST 17 05/05/2019   ALKPHOS 86 05/05/2019   BILITOT 0.2 05/05/2019   Lab Results  Component Value Date   HGBA1C 7.3 (H) 05/05/2019   HGBA1C 10.9 (H) 01/15/2019   HGBA1C 7.8 (A) 05/22/2018   HGBA1C 9.1 (H) 02/06/2018   HGBA1C 7.5 (A) 10/10/2017   Lab Results  Component Value Date   TSH 1.480 02/03/2019   Lab Results  Component Value Date   CHOL 118 05/05/2019   HDL 48 05/05/2019   LDLCALC 56 05/05/2019   TRIG 65 05/05/2019   CHOLHDL 2.5 05/05/2019   Lab Results  Component Value Date   WBC 7.9 05/05/2019   HGB 14.9 05/05/2019   HCT 42.8 05/05/2019   MCV 96 05/05/2019   PLT 432 05/05/2019   Attestation Statements:   Reviewed by clinician on day of visit: allergies, medications, problem list, medical history, surgical history, family history, social history, and previous encounter notes.  I, Water quality scientist, CMA, am acting as Location manager for [employer], DO.  I have reviewed the above documentation for accuracy and completeness, and I agree with the above. Briscoe Deutscher, DO

## 2019-05-21 ENCOUNTER — Ambulatory Visit: Payer: 59 | Admitting: Family Medicine

## 2019-05-21 ENCOUNTER — Encounter: Payer: Self-pay | Admitting: Family Medicine

## 2019-05-21 ENCOUNTER — Ambulatory Visit (INDEPENDENT_AMBULATORY_CARE_PROVIDER_SITE_OTHER): Payer: 59 | Admitting: Family Medicine

## 2019-05-21 ENCOUNTER — Other Ambulatory Visit: Payer: Self-pay

## 2019-05-21 DIAGNOSIS — I10 Essential (primary) hypertension: Secondary | ICD-10-CM

## 2019-05-21 DIAGNOSIS — R928 Other abnormal and inconclusive findings on diagnostic imaging of breast: Secondary | ICD-10-CM

## 2019-05-21 DIAGNOSIS — F329 Major depressive disorder, single episode, unspecified: Secondary | ICD-10-CM

## 2019-05-21 DIAGNOSIS — E1169 Type 2 diabetes mellitus with other specified complication: Secondary | ICD-10-CM

## 2019-05-21 DIAGNOSIS — F419 Anxiety disorder, unspecified: Secondary | ICD-10-CM

## 2019-05-21 DIAGNOSIS — F32A Depression, unspecified: Secondary | ICD-10-CM

## 2019-05-21 DIAGNOSIS — E669 Obesity, unspecified: Secondary | ICD-10-CM

## 2019-05-21 MED ORDER — HYDROCHLOROTHIAZIDE 12.5 MG PO CAPS: ORAL_CAPSULE | ORAL | 1 refills | Status: DC

## 2019-05-21 NOTE — Assessment & Plan Note (Signed)
No anxiety or depression though I did encourage her to see a therapist to work on her self-esteem.  Discussed that she should ask them about ADD testing and if needed I could refer her.

## 2019-05-21 NOTE — Progress Notes (Signed)
Tommi Rumps, MD Phone: [phone]  Veronica Norton is a 44 y.o. female who presents today for f/u.  HYPERTENSION  Disease Monitoring  Home BP Monitoring not checking Chest pain- no    Dyspnea- no Medications  Compliance-  Taking HCTZ.    DIABETES Disease Monitoring: Blood Sugar ranges-not checking, recent A1c 7.3 Polyuria/phagia/dipsia- no      Optho- due Medications: Compliance- taking metformin, trulicity Hypoglycemic symptoms- no She does report her appetite has slightly decreased since starting on the Trulicity.  She typically gets 2 meals a day.  She is following with weight management in [place].  Depression/anxiety: No depression or anxiety currently.  She is going to start seeing a therapist to work on her mental health and self-esteem.  She did have the winter blues.  Her weight management specialist started her on Vyvanse to see if that would help some of the ADD tendencies.  She did note her heart rate increased some with the Vyvanse.     Social History   Tobacco Use  Smoking Status Never Smoker  Smokeless Tobacco Never Used     ROS see history of present illness  Objective  Physical Exam Vitals:   05/21/19 0902 05/21/19 0929  BP: 140/80 130/80  Pulse: 95   Temp: (!) 96.7 F (35.9 C)   SpO2: 93%     BP Readings from Last 3 Encounters:  05/21/19 130/80  05/19/19 122/83  05/05/19 130/85   Wt Readings from Last 3 Encounters:  05/21/19 284 lb 9.6 oz (129.1 kg)  05/19/19 280 lb (127 kg)  05/05/19 282 lb (127.9 kg)    Physical Exam Constitutional:      General: She is not in acute distress.    Appearance: She is not diaphoretic.  Cardiovascular:     Rate and Rhythm: Normal rate and regular rhythm.     Heart sounds: Normal heart sounds.  Pulmonary:     Effort: Pulmonary effort is normal.     Breath sounds: Normal breath sounds.  Musculoskeletal:     Right lower leg: No edema.     Left lower leg: No edema.  Skin:    General:  Skin is warm and dry.  Neurological:     Mental Status: She is alert.    Diabetic Foot Exam - Simple   Simple Foot Form Diabetic Foot exam was performed with the following findings: Yes 05/21/2019  9:39 AM  Visual Inspection See comments: Yes Sensation Testing Intact to touch and monofilament testing bilaterally: Yes Pulse Check Posterior Tibialis and Dorsalis pulse intact bilaterally: Yes Comments Pes planus bilaterally which is fairly extreme, otherwise no deformities, ulcerations, or skin breakdown      Assessment/Plan: Please see individual problem list.  Hypertension Relatively well controlled.  She will continue her current regimen.  She will continue to work on weight loss.  Diabetes mellitus type 2 in obese Much improved.  Continue Trulicity and Metformin.  Abnormal mammogram Found to be fat necrosis.  She is back on yearly screening.  Severe obesity (BMI >= 40) She will continue to follow with weight management.  Anxiety and depression No anxiety or depression though I did encourage her to see a therapist to work on her self-esteem.  Discussed that she should ask them about ADD testing and if needed I could refer her.   Health Maintenance: She will see GYN for her Pap smear later this year.  She is due for an ophthalmology exam and she will contact them to schedule this.  No orders of the defined types were placed in this encounter.   Meds ordered this encounter  Medications  . hydrochlorothiazide (MICROZIDE) 12.5 MG capsule    Sig: TAKE 1 CAPSULE BY MOUTH EVERY DAY    Dispense:  90 capsule    Refill:  1    This visit occurred during the SARS-CoV-2 public health emergency.  Safety protocols were in place, including screening questions prior to the visit, additional usage of staff PPE, and extensive cleaning of exam room while observing appropriate contact time as indicated for disinfecting solutions.    Tommi Rumps, MD [place]

## 2019-05-21 NOTE — Patient Instructions (Signed)
Nice to see you. If you have not had an eye exam in the last year please call to schedule one. Please continue to work on losing weight. Please continue your current medications.

## 2019-05-21 NOTE — Assessment & Plan Note (Signed)
She will continue to follow with weight management.

## 2019-05-21 NOTE — Assessment & Plan Note (Signed)
Found to be fat necrosis.  She is back on yearly screening.

## 2019-05-21 NOTE — Assessment & Plan Note (Signed)
Relatively well controlled.  She will continue her current regimen.  She will continue to work on weight loss.

## 2019-05-21 NOTE — Assessment & Plan Note (Signed)
Much improved.  Continue Trulicity and Metformin.

## 2019-06-02 ENCOUNTER — Ambulatory Visit: Payer: 59 | Admitting: Pharmacist

## 2019-06-02 DIAGNOSIS — E1169 Type 2 diabetes mellitus with other specified complication: Secondary | ICD-10-CM

## 2019-06-02 NOTE — Patient Instructions (Signed)
Visit Information  Goals Addressed            This Visit's Progress     Patient Stated   . "I want to get my sugars better under control" (pt-stated)       CARE PLAN ENTRY (see longtitudinal plan of care for additional care plan information)  Current Barriers:  . Diabetes: uncontrolled; most recent 7.3% o Notes that she is very proud of the improvement in her A1c. Enjoys following with Weight Management Dr. Juleen China. Is planning on starting therapy as well soon . Current antihyperglycemic regimen: metformin 123XX123 mg BID, Trulicity 3 mg weekly - recent increased by Dr. Juleen China  . Current glucose readings: not checking . Cardiovascular risk reduction: o Current hypertensive regimen: HCTZ 12.5 mg daily; BP at goal on last check o Current hyperlipidemia regimen: none; LDL well controlled and 10 year ASCVD risk 3.8%; however, patient contemplating pregnancy in the future . Binge eating + ADD: Vyvanse 20 mg daily   Pharmacist Clinical Goal(s):  Marland Kitchen Over the next 90 days, patient will work with PharmD and primary care provider to address optimized glycemic management  Interventions: . Patient unable to talk for long today and was unable to schedule follow up at this time. Congratulated her on improvement in A1c . Patient notes that although she follows regularly w/ Dr. Juleen China who is managing her diabetes, she would still like to continue in CCM program with me. Will collaborate w/ Care Guides to outreach to schedule f/u with me.  Patient Self Care Activities:  . Patient will check use CGM to check glucose, document, and provide at future appointments . Patient will take medications as prescribed . Patient will report any questions or concerns to provider   Please see past updates related to this goal by clicking on the "Past Updates" button in the selected goal         Patient verbalizes understanding of instructions provided today.   Plan:  - Will collaborate w/ Care Guide to  outreach patient to schedule f/u with me  Catie Darnelle Maffucci, PharmD, [place], [place] Pharmacist [place] [phone]

## 2019-06-02 NOTE — Chronic Care Management (AMB) (Signed)
Chronic Care Management   Follow Up Note   06/02/2019 Name: Veronica Norton MRN: [id number] DOB: 1975-08-29  Referred by: Leone Haven, MD Reason for referral : Chronic Care Management (Medication Management)   Veronica Norton is a 44 y.o. year old female who is a primary care patient of Caryl Bis, Angela Adam, MD. The CCM team was consulted for assistance with chronic disease management and care coordination needs.    Contacted patient for medication management review. Patient reported she had intended to call to reschedule our appointment, as she cannot talk for long right now.  Review of patient status, including review of consultants reports, relevant laboratory and other test results, and collaboration with appropriate care team members and the patient's provider was performed as part of comprehensive patient evaluation and provision of chronic care management services.    SDOH (Social Determinants of Health) assessments performed: No See Care Plan activities for detailed interventions related to [facility])     Outpatient Encounter Medications as of 06/02/2019  Medication Sig  . APPLE CIDER VINEGAR PO Take by mouth. 2-3 gummies 2 times per day  . Ascorbic Acid (VITAMIN C ADULT GUMMIES PO) Take by mouth.  . blood glucose meter kit and supplies KIT Dispense based on patient and insurance preference. Use fasting, prior to lunch and prior to dinner. (FOR ICD-10 E11.9)  . Continuous Blood Gluc Sensor (FREESTYLE LIBRE 14 DAY SENSOR) MISC Inject 1 Device into the skin every 14 (fourteen) days.  . Dulaglutide (TRULICITY) 3 UM/3.5TI SOPN Inject 3 mg into the skin once a week.  Marland Kitchen ELDERBERRY PO Take by mouth. 1 gummy a day  . fluticasone (CUTIVATE) 0.05 % cream APPLY TO AFFECTED AREA UP TO TWICE A DAY AS NEEDED FOR RASH/ITCHING  . glucose blood (FREESTYLE TEST STRIPS) test strip Check sugar bid  . glucose monitoring kit (FREESTYLE) monitoring kit Use as directed to check sugars bid  .  hydrochlorothiazide (MICROZIDE) 12.5 MG capsule TAKE 1 CAPSULE BY MOUTH EVERY DAY  . Lancets (ONETOUCH ULTRASOFT) lancets Use as instructed  . lisdexamfetamine (VYVANSE) 20 MG capsule Take 1 capsule (20 mg total) by mouth daily.  . metFORMIN (GLUCOPHAGE-XR) 500 MG 24 hr tablet Take 2 tablets (1,000 mg total) by mouth 2 (two) times daily.  . norethindrone (CAMILA) 0.35 MG tablet Take 1 tablet (0.35 mg total) by mouth daily.  . Vitamin D, Ergocalciferol, (DRISDOL) 1.25 MG (50000 UNIT) CAPS capsule Take 1 capsule (50,000 Units total) by mouth every 7 (seven) days.   No facility-administered encounter medications on file as of 06/02/2019.     Objective:   Goals Addressed            This Visit's Progress     Patient Stated   . "I want to get my sugars better under control" (pt-stated)       CARE PLAN ENTRY (see longtitudinal plan of care for additional care plan information)  Current Barriers:  . Diabetes: uncontrolled; most recent 7.3% o Notes that she is very proud of the improvement in her A1c. Enjoys following with Weight Management Dr. Juleen China. Is planning on starting therapy as well soon . Current antihyperglycemic regimen: metformin 1443 mg BID, Trulicity 3 mg weekly - recent increased by Dr. Juleen China  . Current glucose readings: not checking . Cardiovascular risk reduction: o Current hypertensive regimen: HCTZ 12.5 mg daily; BP at goal on last check o Current hyperlipidemia regimen: none; LDL well controlled and 10 year ASCVD risk 3.8%; however, patient contemplating pregnancy  in the future . Binge eating + ADD: Vyvanse 20 mg daily   Pharmacist Clinical Goal(s):  Marland Kitchen Over the next 90 days, patient will work with PharmD and primary care provider to address optimized glycemic management  Interventions: . Patient unable to talk for long today and was unable to schedule follow up at this time. Congratulated her on improvement in A1c . Patient notes that although she follows  regularly w/ Dr. Juleen China who is managing her diabetes, she would still like to continue in CCM program with me. Will collaborate w/ Care Guides to outreach to schedule f/u with me.  Patient Self Care Activities:  . Patient will check use CGM to check glucose, document, and provide at future appointments . Patient will take medications as prescribed . Patient will report any questions or concerns to provider   Please see past updates related to this goal by clicking on the "Past Updates" button in the selected goal          Plan:  - Will collaborate w/ Care Guide to outreach patient to schedule f/u with me  Catie Darnelle Maffucci, PharmD, [place], [place] Pharmacist [place] [phone]

## 2019-06-03 ENCOUNTER — Telehealth: Payer: Self-pay | Admitting: Family Medicine

## 2019-06-03 NOTE — Chronic Care Management (AMB) (Signed)
  Care Management   Note  06/03/2019 Name: Veronica Norton MRN: [id number] DOB: 1975/12/25  Veronica Norton is a 44 y.o. year old female who is a primary care patient of Leone Haven, MD and is actively engaged with the care management team. I reached out to [facility] by phone today to assist with re-scheduling a follow up visit with the Pharmacist  Follow up plan: Telephone appointment with care management team member scheduled for:07/17/2019  Noreene Larsson, [place], [place], [place] [address] Direct Dial: [phone] [email or web address]: [email or web address]

## 2019-06-09 ENCOUNTER — Ambulatory Visit (INDEPENDENT_AMBULATORY_CARE_PROVIDER_SITE_OTHER): Payer: 59 | Admitting: Family Medicine

## 2019-06-09 ENCOUNTER — Other Ambulatory Visit: Payer: Self-pay

## 2019-06-09 ENCOUNTER — Encounter (INDEPENDENT_AMBULATORY_CARE_PROVIDER_SITE_OTHER): Payer: Self-pay | Admitting: Family Medicine

## 2019-06-09 VITALS — BP 130/84 | HR 95 | Temp 98.1°F | Ht 65.0 in | Wt 278.0 lb

## 2019-06-09 DIAGNOSIS — E119 Type 2 diabetes mellitus without complications: Secondary | ICD-10-CM

## 2019-06-09 DIAGNOSIS — F3289 Other specified depressive episodes: Secondary | ICD-10-CM

## 2019-06-09 DIAGNOSIS — F4321 Adjustment disorder with depressed mood: Secondary | ICD-10-CM | POA: Diagnosis not present

## 2019-06-09 DIAGNOSIS — E559 Vitamin D deficiency, unspecified: Secondary | ICD-10-CM | POA: Diagnosis not present

## 2019-06-09 DIAGNOSIS — Z6841 Body Mass Index (BMI) 40.0 and over, adult: Secondary | ICD-10-CM

## 2019-06-10 MED ORDER — TRULICITY 4.5 MG/0.5ML ~~LOC~~ SOAJ: 4.5000 mg | SUBCUTANEOUS | 0 refills | Status: DC

## 2019-06-10 MED ORDER — TRULICITY 3 MG/0.5ML ~~LOC~~ SOAJ: 4.0000 mg | SUBCUTANEOUS | 0 refills | Status: DC

## 2019-06-10 NOTE — Progress Notes (Signed)
Chief Complaint:   OBESITY Veronica Norton is here to discuss her progress with her obesity treatment plan along with follow-up of her obesity related diagnoses. Veronica Norton is on the Category 1 Plan and states she is following her eating plan approximately 50% of the time. Veronica Norton states she is walking for 20 minutes 2 times per week.  Today's visit was #: 9 Starting weight: 284 lbs Starting date: 02/03/2019 Today's weight: 278 lbs Today's date: 06/09/2019 Total lbs lost to date: 6 lbs Total lbs lost since last in-office visit: 2 lbs  Interim History:   Subjective:   1. Type 2 diabetes mellitus without complication, without long-term current use of insulin (HCC) Veronica Norton's A1c has improved to 7.3 from 10.9.    Lab Results  Component Value Date   HGBA1C 7.3 (H) 05/05/2019   HGBA1C 10.9 (H) 01/15/2019   HGBA1C 7.8 (A) 05/22/2018   Lab Results  Component Value Date   MICROALBUR 0.8 01/15/2019   LDLCALC 56 05/05/2019   CREATININE 0.73 05/05/2019   2. Vitamin D deficiency Veronica Norton's Vitamin D level was 40.6 on 05/05/2019. She is currently taking prescription vitamin D 50,000 IU each week. She denies nausea, vomiting or muscle weakness.  3. Other depression, with emotional eating Veronica Norton is struggling with emotional eating and using food for comfort to the extent that it is negatively impacting her health. She has been working on behavior modification techniques to help reduce her emotional eating and has been unsuccessful. She did try to start therapy with [facility] after our last visit, but insurance doesn't cover.  4. Grief Veronica Norton's uncle died last week.  She found him and needed to do CPR.  She lived with him. This has been a difficult, traumatic time for her.  Assessment/Plan:   1. Type 2 diabetes mellitus without complication, without long-term current use of insulin (HCC) Good blood sugar control is important to decrease the likelihood of diabetic complications such as nephropathy,  neuropathy, limb loss, blindness, coronary artery disease, and death. Intensive lifestyle modification including diet, exercise and weight loss are the first line of treatment for diabetes.    2. Vitamin D deficiency Low Vitamin D level contributes to fatigue and are associated with obesity, breast, and colon cancer. She agrees to continue to take prescription Vitamin D @50 ,000 IU every week and will follow-up for routine testing of Vitamin D, at least 2-3 times per year to avoid over-replacement.  3. Other depression, with emotional eating Behavior modification techniques were discussed today to help Veronica Norton deal with her emotional/non-hunger eating behaviors.  Orders and follow up as documented in patient record.   4. Grief Will continue to monitor.  5. Class 3 severe obesity with serious comorbidity and body mass index (BMI) of 45.0 to 49.9 in adult, unspecified obesity type (HCC) Veronica Norton is currently in the action stage of change. As such, her goal is to continue with weight loss efforts. She has agreed to the Category 1 Plan.   Exercise goals: For substantial health benefits, adults should do at least 150 minutes (2 hours and 30 minutes) a week of moderate-intensity, or 75 minutes (1 hour and 15 minutes) a week of vigorous-intensity aerobic physical activity, or an equivalent combination of moderate- and vigorous-intensity aerobic activity. Aerobic activity should be performed in episodes of at least 10 minutes, and preferably, it should be spread throughout the week.  Behavioral modification strategies: increasing lean protein intake and increasing water intake.  Veronica Norton has agreed to follow-up with our clinic  in 2 weeks. She was informed of the importance of frequent follow-up visits to maximize her success with intensive lifestyle modifications for her multiple health conditions.   Objective:   Blood pressure 130/84, pulse 95, temperature 98.1 F (36.7 C), temperature source Oral, height 5'  5" (1.651 m), weight 278 lb (126.1 kg), last menstrual period 05/18/2019, SpO2 100 %. Body mass index is 46.26 kg/m.  General: Cooperative, alert, well developed, in no acute distress. HEENT: Conjunctivae and lids unremarkable. Cardiovascular: Regular rhythm.  Lungs: Normal work of breathing. Neurologic: No focal deficits.   Lab Results  Component Value Date   CREATININE 0.73 05/05/2019   BUN 10 05/05/2019   NA 138 05/05/2019   K 4.6 05/05/2019   CL 102 05/05/2019   CO2 23 05/05/2019   Lab Results  Component Value Date   ALT 14 05/05/2019   AST 17 05/05/2019   ALKPHOS 86 05/05/2019   BILITOT 0.2 05/05/2019   Lab Results  Component Value Date   HGBA1C 7.3 (H) 05/05/2019   HGBA1C 10.9 (H) 01/15/2019   HGBA1C 7.8 (A) 05/22/2018   HGBA1C 9.1 (H) 02/06/2018   HGBA1C 7.5 (A) 10/10/2017   Lab Results  Component Value Date   TSH 1.480 02/03/2019   Lab Results  Component Value Date   CHOL 118 05/05/2019   HDL 48 05/05/2019   LDLCALC 56 05/05/2019   TRIG 65 05/05/2019   CHOLHDL 2.5 05/05/2019   Lab Results  Component Value Date   WBC 7.9 05/05/2019   HGB 14.9 05/05/2019   HCT 42.8 05/05/2019   MCV 96 05/05/2019   PLT 432 05/05/2019   Attestation Statements:   Reviewed by clinician on day of visit: allergies, medications, problem list, medical history, surgical history, family history, social history, and previous encounter notes.  Time spent on visit including pre-visit chart review and post-visit care and charting was 45 minutes.   I, Water quality scientist, CMA, am acting as Location manager for [employer], DO.  I have reviewed the above documentation for accuracy and completeness, and I agree with the above. Briscoe Deutscher, DO

## 2019-06-25 ENCOUNTER — Other Ambulatory Visit: Payer: Self-pay

## 2019-06-25 ENCOUNTER — Encounter (INDEPENDENT_AMBULATORY_CARE_PROVIDER_SITE_OTHER): Payer: Self-pay | Admitting: Family Medicine

## 2019-06-25 ENCOUNTER — Ambulatory Visit (INDEPENDENT_AMBULATORY_CARE_PROVIDER_SITE_OTHER): Payer: 59 | Admitting: Family Medicine

## 2019-06-25 VITALS — BP 134/89 | HR 99 | Temp 98.6°F | Ht 65.0 in | Wt 284.0 lb

## 2019-06-25 DIAGNOSIS — F4323 Adjustment disorder with mixed anxiety and depressed mood: Secondary | ICD-10-CM | POA: Diagnosis not present

## 2019-06-25 DIAGNOSIS — F4522 Body dysmorphic disorder: Secondary | ICD-10-CM

## 2019-06-25 DIAGNOSIS — E119 Type 2 diabetes mellitus without complications: Secondary | ICD-10-CM | POA: Diagnosis not present

## 2019-06-25 DIAGNOSIS — I10 Essential (primary) hypertension: Secondary | ICD-10-CM

## 2019-06-25 DIAGNOSIS — Z9189 Other specified personal risk factors, not elsewhere classified: Secondary | ICD-10-CM

## 2019-06-25 DIAGNOSIS — E1159 Type 2 diabetes mellitus with other circulatory complications: Secondary | ICD-10-CM

## 2019-06-25 DIAGNOSIS — I152 Hypertension secondary to endocrine disorders: Secondary | ICD-10-CM

## 2019-06-25 DIAGNOSIS — Z6841 Body Mass Index (BMI) 40.0 and over, adult: Secondary | ICD-10-CM

## 2019-06-26 MED ORDER — TRULICITY 4.5 MG/0.5ML ~~LOC~~ SOAJ: 4.5000 mg | SUBCUTANEOUS | 0 refills | Status: DC

## 2019-06-26 NOTE — Progress Notes (Signed)
Chief Complaint:   OBESITY Veronica Norton is here to discuss her progress with her obesity treatment plan along with follow-up of her obesity related diagnoses. Veronica Norton is on the Category 1 Plan and states she is following her eating plan approximately 50% of the time. Veronica Norton states she is exercising for 0 minutes 0 times per week.  Today's visit was #: 10 Starting weight: 284 lbs Starting date: 02/03/2019 Today's weight: 284 lbs Today's date: 06/25/2019 Total lbs lost to date: 0 Total lbs lost since last in-office visit: 0  Interim History: Veronica Norton reports that she has been journaling (nonfood-related).  She is doing 1 house project and 2 self-care projects per week.  Subjective:   1. Type 2 diabetes mellitus without complication, without long-term current use of insulin (HCC) Medications reviewed.  Veronica Norton is tolerating the maximum dose of Trulicity. Her next HbA1c will be due after 08/02/19. She denies polyphagia.  Lab Results  Component Value Date   HGBA1C 7.3 (H) 05/05/2019   HGBA1C 10.9 (H) 01/15/2019   HGBA1C 7.8 (A) 05/22/2018   Lab Results  Component Value Date   MICROALBUR 0.8 01/15/2019   LDLCALC 56 05/05/2019   CREATININE 0.73 05/05/2019   2. Adjustment disorder with mixed anxiety and depressed mood Veronica Norton is now living alone in the family home since her uncle died.  She was his caregiver, so she is adjusting to the change.  She would like to work on her career.  Her dream is to do social work.  3. Body dysmorphic disorder Veronica Norton is very uncomfortable showing her body.    4. Hypertension associated with diabetes ([place]) Review: taking medications as instructed, no medication side effects noted, no chest pain on exertion, no dyspnea on exertion, no swelling of ankles.   BP Readings from Last 3 Encounters:  06/25/19 134/89  06/09/19 130/84  05/21/19 130/80   5. At risk for heart disease Veronica Norton is at a higher than average risk for cardiovascular disease due to obesity.    The ASCVD Risk score Veronica Bussing DC Jr., Veronica al., 2013) failed to calculate for the following reasons:   The valid total cholesterol range is 130 to 320 mg/dL  Assessment/Plan:   1. Type 2 diabetes mellitus without complication, without long-term current use of insulin (HCC) Good blood sugar control is important to decrease the likelihood of diabetic complications such as nephropathy, neuropathy, limb loss, blindness, coronary artery disease, and death. Intensive lifestyle modification including diet, exercise and weight loss are the first line of treatment for diabetes.   Orders - Dulaglutide (TRULICITY) 4.5 0000000 SOPN; Inject 4.5 mg as directed once a week.  Dispense: 12 pen; Refill: 0  2. Adjustment disorder with mixed anxiety and depressed mood This problem is worsening. Counseling: Intensive lifestyle modifications are the first line treatment for this issue. We discussed several lifestyle modifications today and she will continue to work on diet, exercise and weight loss efforts. We will continue to monitor. Orders and follow up as documented in patient record.  3. Body dysmorphic disorder We discussed therapy for this, but she is still searching for a therapist.  4. Hypertension associated with diabetes ([place]) Veronica Norton is working on healthy weight loss and exercise to improve blood pressure control. We will watch for signs of hypotension as she continues her lifestyle modifications.  5. At risk for heart disease Veronica Norton was given approximately 15 minutes of coronary artery disease prevention counseling today. She is 44 y.o. female and has risk factors for heart disease  including obesity. We discussed intensive lifestyle modifications today with an emphasis on specific weight loss instructions and strategies.   Repetitive spaced learning was employed today to elicit superior memory formation and behavioral change.  6. Class 3 severe obesity with serious comorbidity and body mass index (BMI)  of 45.0 to 49.9 in adult, unspecified obesity type (HCC) Veronica Norton is currently in the action stage of change. As such, her goal is to continue with weight loss efforts. She has agreed to the Category 1 Plan.   Exercise goals: For substantial health benefits, adults should do at least 150 minutes (2 hours and 30 minutes) a week of moderate-intensity, or 75 minutes (1 hour and 15 minutes) a week of vigorous-intensity aerobic physical activity, or an equivalent combination of moderate- and vigorous-intensity aerobic activity. Aerobic activity should be performed in episodes of at least 10 minutes, and preferably, it should be spread throughout the week.  Behavioral modification strategies: increasing lean protein intake, decreasing simple carbohydrates, increasing vegetables and increasing water intake.  Veronica Norton has agreed to follow-up with our clinic in 3 weeks. She was informed of the importance of frequent follow-up visits to maximize her success with intensive lifestyle modifications for her multiple health conditions.   Objective:   Blood pressure 134/89, pulse 99, temperature 98.6 F (37 C), temperature source Oral, height 5\' 5"  (1.651 m), weight 284 lb (128.8 kg), SpO2 98 %. Body mass index is 47.26 kg/m.  General: Cooperative, alert, well developed, in no acute distress. HEENT: Conjunctivae and lids unremarkable. Cardiovascular: Regular rhythm.  Lungs: Normal work of breathing. Neurologic: No focal deficits.   Lab Results  Component Value Date   CREATININE 0.73 05/05/2019   BUN 10 05/05/2019   NA 138 05/05/2019   K 4.6 05/05/2019   CL 102 05/05/2019   CO2 23 05/05/2019   Lab Results  Component Value Date   ALT 14 05/05/2019   AST 17 05/05/2019   ALKPHOS 86 05/05/2019   BILITOT 0.2 05/05/2019   Lab Results  Component Value Date   HGBA1C 7.3 (H) 05/05/2019   HGBA1C 10.9 (H) 01/15/2019   HGBA1C 7.8 (A) 05/22/2018   HGBA1C 9.1 (H) 02/06/2018   HGBA1C 7.5 (A) 10/10/2017   Lab  Results  Component Value Date   TSH 1.480 02/03/2019   Lab Results  Component Value Date   CHOL 118 05/05/2019   HDL 48 05/05/2019   LDLCALC 56 05/05/2019   TRIG 65 05/05/2019   CHOLHDL 2.5 05/05/2019   Lab Results  Component Value Date   WBC 7.9 05/05/2019   HGB 14.9 05/05/2019   HCT 42.8 05/05/2019   MCV 96 05/05/2019   PLT 432 05/05/2019   Attestation Statements:   Reviewed by clinician on day of visit: allergies, medications, problem list, medical history, surgical history, family history, social history, and previous encounter notes.  I, Water quality scientist, CMA, am acting as Location manager for [employer], DO.  I have reviewed the above documentation for accuracy and completeness, and I agree with the above. Briscoe Deutscher, DO

## 2019-07-06 ENCOUNTER — Other Ambulatory Visit (INDEPENDENT_AMBULATORY_CARE_PROVIDER_SITE_OTHER): Payer: Self-pay | Admitting: Family Medicine

## 2019-07-06 DIAGNOSIS — E119 Type 2 diabetes mellitus without complications: Secondary | ICD-10-CM

## 2019-07-17 ENCOUNTER — Ambulatory Visit (INDEPENDENT_AMBULATORY_CARE_PROVIDER_SITE_OTHER): Payer: 59 | Admitting: Dermatology

## 2019-07-17 ENCOUNTER — Ambulatory Visit: Payer: 59 | Admitting: Pharmacist

## 2019-07-17 ENCOUNTER — Other Ambulatory Visit: Payer: Self-pay

## 2019-07-17 VITALS — BP 128/81 | HR 81

## 2019-07-17 DIAGNOSIS — L309 Dermatitis, unspecified: Secondary | ICD-10-CM

## 2019-07-17 DIAGNOSIS — L709 Acne, unspecified: Secondary | ICD-10-CM | POA: Diagnosis not present

## 2019-07-17 DIAGNOSIS — I10 Essential (primary) hypertension: Secondary | ICD-10-CM

## 2019-07-17 DIAGNOSIS — L810 Postinflammatory hyperpigmentation: Secondary | ICD-10-CM

## 2019-07-17 DIAGNOSIS — E669 Obesity, unspecified: Secondary | ICD-10-CM

## 2019-07-17 DIAGNOSIS — L818 Other specified disorders of pigmentation: Secondary | ICD-10-CM

## 2019-07-17 DIAGNOSIS — F439 Reaction to severe stress, unspecified: Secondary | ICD-10-CM

## 2019-07-17 DIAGNOSIS — E1169 Type 2 diabetes mellitus with other specified complication: Secondary | ICD-10-CM

## 2019-07-17 MED ORDER — DAPSONE 7.5 % EX GEL: CUTANEOUS | 2 refills | Status: DC

## 2019-07-17 MED ORDER — SPIRONOLACTONE 100 MG PO TABS: 100.0000 mg | ORAL_TABLET | Freq: Every day | ORAL | 4 refills | Status: DC

## 2019-07-17 NOTE — Patient Instructions (Signed)
Recommend daily broad spectrum sunscreen SPF 30+ to sun-exposed areas, reapply every 2 hours as needed. Call for new or changing lesions.  

## 2019-07-17 NOTE — Chronic Care Management (AMB) (Signed)
Chronic Care Management   Follow Up Note  07/17/2019 Name: Veronica Norton MRN: [id number] DOB: 12/11/1975  Referred by: Leone Haven, MD Reason for referral : Chronic Care Management (Medication Management)   Veronica Norton is a 44 y.o. year old female who is a primary care patient of Caryl Bis, Angela Adam, MD. The CCM team was consulted for assistance with chronic disease management and care coordination needs.    Contacted patient for medication management review.   Review of patient status, including review of consultants reports, relevant laboratory and other test results, and collaboration with appropriate care team members and the patient's provider was performed as part of comprehensive patient evaluation and provision of chronic care management services.    SDOH (Social Determinants of Health) assessments performed: Yes See Care Plan activities for detailed interventions related to [facility])     Outpatient Encounter Medications as of 07/17/2019  Medication Sig  . Ascorbic Acid (VITAMIN C ADULT GUMMIES PO) Take by mouth.  . blood glucose meter kit and supplies KIT Dispense based on patient and insurance preference. Use fasting, prior to lunch and prior to dinner. (FOR ICD-10 E11.9)  . Dapsone (ACZONE) 7.5 % GEL Apply small amount once daily to affected areas  . Dulaglutide (TRULICITY) 4.5 NO/7.0JG SOPN Inject 4.5 mg as directed once a week.  Marland Kitchen glucose blood (FREESTYLE TEST STRIPS) test strip Check sugar bid  . glucose monitoring kit (FREESTYLE) monitoring kit Use as directed to check sugars bid  . hydrochlorothiazide (MICROZIDE) 12.5 MG capsule TAKE 1 CAPSULE BY MOUTH EVERY DAY  . Lancets (ONETOUCH ULTRASOFT) lancets Use as instructed  . metFORMIN (GLUCOPHAGE-XR) 500 MG 24 hr tablet Take 2 tablets (1,000 mg total) by mouth 2 (two) times daily.  . norethindrone (CAMILA) 0.35 MG tablet Take 1 tablet (0.35 mg total) by mouth daily.  Marland Kitchen spironolactone (ALDACTONE) 100 MG  tablet Take 1 tablet (100 mg total) by mouth daily.  . Vitamin D, Ergocalciferol, (DRISDOL) 1.25 MG (50000 UNIT) CAPS capsule Take 1 capsule (50,000 Units total) by mouth every 7 (seven) days.  . [DISCONTINUED] ELDERBERRY PO Take by mouth. 1 gummy a day  . APPLE CIDER VINEGAR PO Take by mouth. 2-3 gummies 2 times per day  . Continuous Blood Gluc Sensor (FREESTYLE LIBRE 14 DAY SENSOR) MISC Inject 1 Device into the skin every 14 (fourteen) days.  . fluticasone (CUTIVATE) 0.05 % cream APPLY TO AFFECTED AREA UP TO TWICE A DAY AS NEEDED FOR RASH/ITCHING   No facility-administered encounter medications on file as of 07/17/2019.     Objective:   Goals Addressed            This Visit's Progress     Patient Stated   . "I want to get my sugars better under control" (pt-stated)       CARE PLAN ENTRY (see longtitudinal plan of care for additional care plan information)  Current Barriers:  . Diabetes: uncontrolled; most recent 7.3%, will be due for A1c later this month o [facility]ues [facility] o Appointment with therapist on 07/31/19. Recent stressors include her uncle, who she was the primary caretaker for, passing away. Notes concerns with stress eating.  . Current antihyperglycemic regimen: metformin 2836 mg BID, Trulicity 4.5 mg weekly . Current glucose readings: not checking right now, does not like finger sticks. Never investigated cost and coverage of FreeStyle Libre . Cardiovascular risk reduction o Current hypertensive regimen: HCTZ 12.5 mg daily, spironolactone 100 mg QAM;  BP at dermatology appointment today at goal <130/80 o Current hyperlipidemia regimen: none; LDL well controlled and 10 year ASCVD risk 3.8%; however, patient previously noted she was contemplating pregnancy in the future  Pharmacist Clinical Goal(s):  Marland Kitchen Over the next 90 days, patient will work with PharmD and primary care provider to address optimized glycemic  management  Interventions: . Comprehensive medication review performed, medication list updated in electronic medical record . Inter-disciplinary care team collaboration (see longitudinal plan of care) . Reviewed goal A1c, goal fasting, and goal 2 hour post prandial sugars. Encouraged to check occasional sugar readings to allow for estimation of control before next A1c check. She verbalized understanding . Praised for scheduling w/ therapist. Noted that if she needs help determining therapist in network with her insurance in the future, she can call me, and I will place a Care Guide referral for support with this . Reviewed goal BP <130/80  Patient Self Care Activities:  . Patient will check use CGM to check glucose, document, and provide at future appointments . Patient will take medications as prescribed . Patient will report any questions or concerns to provider   Please see past updates related to this goal by clicking on the "Past Updates" button in the selected goal          Plan:  - Scheduled f/u call in ~ 12 weeks  Catie Darnelle Maffucci, PharmD, [place], [place] Pharmacist [place] [phone]

## 2019-07-17 NOTE — Progress Notes (Signed)
   Follow-Up Visit   Subjective  Veronica Norton is a 44 y.o. female who presents for the following: Acne (has been taking spironolactone 100mg  QD, no curernt breakout at this time.) and Eczema (forehead, seems to be gone, using Hydrocortisone cream 2.5%).  Forehead is discolored/dark.  No side effects from spironolactone.   The following portions of the chart were reviewed this encounter and updated as appropriate:     Review of Systems:  No other skin or systemic complaints except as noted in HPI or Assessment and Plan.  Objective  Well appearing patient in no apparent distress; mood and affect are within normal limits.  A focused examination was performed including face. Relevant physical exam findings are noted in the Assessment and Plan.  Objective  Face: Few resolving inflammatory papules on right chin and left jaw BP 128/81  Objective  Mid Forehead: Hyperpigmented patch forehead   Assessment & Plan  Acne, unspecified acne type Face  Start Aczone 7.5% gel daily to face Continue Spironolactone 100 mg PO daily  Dapsone (ACZONE) 7.5 % GEL - Face  Reordered Medications spironolactone (ALDACTONE) 100 MG tablet  Post-inflammatory hyperpigmentation Mid Forehead  Secondary to Eczema (improved) HC 2.5% cream qd/bid prn flares eczema Start Skin Medicinals fade cream (pt has) at night and use spf qam.  Recommend daily broad spectrum sunscreen SPF 30+ to sun-exposed areas, reapply every 2 hours as needed.   Return in about 4 months (around 11/17/2019) for Acne, with Dr. Laurence Ferrari.   Marene Lenz, CMA, am acting as scribe for Brendolyn Patty, MD .  Documentation: I have reviewed the above documentation for accuracy and completeness, and I agree with the above.  Brendolyn Patty MD

## 2019-07-17 NOTE — Patient Instructions (Signed)
Visit Information  Goals Addressed            This Visit's Progress     Patient Stated   . "I want to get my sugars better under control" (pt-stated)       CARE PLAN ENTRY (see longtitudinal plan of care for additional care plan information)  Current Barriers:  . Diabetes: uncontrolled; most recent 7.3%, will be due for A1c later this month o Continues to follow w/ [facility] o Appointment with therapist on 07/31/19. Recent stressors include her uncle, who she was the primary caretaker for, passing away. Notes concerns with stress eating.  . Current antihyperglycemic regimen: metformin 123XX123 mg BID, Trulicity 4.5 mg weekly . Current glucose readings: not checking right now, does not like finger sticks. Never investigated cost and coverage of FreeStyle Libre . Cardiovascular risk reduction o Current hypertensive regimen: HCTZ 12.5 mg daily, spironolactone 100 mg QAM; BP at dermatology appointment today at goal <130/80 o Current hyperlipidemia regimen: none; LDL well controlled and 10 year ASCVD risk 3.8%; however, patient previously noted she was contemplating pregnancy in the future  Pharmacist Clinical Goal(s):  Marland Kitchen Over the next 90 days, patient will work with PharmD and primary care provider to address optimized glycemic management  Interventions: . Comprehensive medication review performed, medication list updated in electronic medical record . Inter-disciplinary care team collaboration (see longitudinal plan of care) . Reviewed goal A1c, goal fasting, and goal 2 hour post prandial sugars. Encouraged to check occasional sugar readings to allow for estimation of control before next A1c check. She verbalized understanding . Praised for scheduling w/ therapist. Noted that if she needs help determining therapist in network with her insurance in the future, she can call me, and I will place a Care Guide referral for support with this . Reviewed goal BP  <130/80 . Encouraged to set exercise goal, starting small at 2-3 days per week. She verbalized understanding.   Patient Self Care Activities:  . Patient will check use CGM to check glucose, document, and provide at future appointments . Patient will take medications as prescribed . Patient will report any questions or concerns to provider   Please see past updates related to this goal by clicking on the "Past Updates" button in the selected goal         Patient verbalizes understanding of instructions provided today.   Plan:  - Scheduled f/u call in ~ 12 weeks  Catie Darnelle Maffucci, PharmD, [place], [place] Pharmacist [place] [phone]

## 2019-07-21 ENCOUNTER — Other Ambulatory Visit: Payer: Self-pay

## 2019-07-21 ENCOUNTER — Encounter (INDEPENDENT_AMBULATORY_CARE_PROVIDER_SITE_OTHER): Payer: Self-pay | Admitting: Family Medicine

## 2019-07-21 ENCOUNTER — Ambulatory Visit (INDEPENDENT_AMBULATORY_CARE_PROVIDER_SITE_OTHER): Payer: 59 | Admitting: Family Medicine

## 2019-07-21 VITALS — BP 125/80 | HR 91 | Temp 98.5°F | Ht 65.0 in | Wt 283.0 lb

## 2019-07-21 DIAGNOSIS — F4323 Adjustment disorder with mixed anxiety and depressed mood: Secondary | ICD-10-CM | POA: Diagnosis not present

## 2019-07-21 DIAGNOSIS — E119 Type 2 diabetes mellitus without complications: Secondary | ICD-10-CM | POA: Diagnosis not present

## 2019-07-21 DIAGNOSIS — E559 Vitamin D deficiency, unspecified: Secondary | ICD-10-CM

## 2019-07-21 DIAGNOSIS — Z6841 Body Mass Index (BMI) 40.0 and over, adult: Secondary | ICD-10-CM

## 2019-07-22 ENCOUNTER — Other Ambulatory Visit (INDEPENDENT_AMBULATORY_CARE_PROVIDER_SITE_OTHER): Payer: Self-pay | Admitting: Family Medicine

## 2019-07-22 DIAGNOSIS — E559 Vitamin D deficiency, unspecified: Secondary | ICD-10-CM

## 2019-07-22 MED ORDER — VITAMIN D (ERGOCALCIFEROL) 1.25 MG (50000 UNIT) PO CAPS: 50000.0000 [IU] | ORAL_CAPSULE | ORAL | 0 refills | Status: DC

## 2019-07-22 NOTE — Progress Notes (Signed)
Chief Complaint:   OBESITY Veronica Norton is here to discuss her progress with her obesity treatment plan along with follow-up of her obesity related diagnoses. Veronica Norton is on the Category 1 Plan and states she is following her eating plan approximately 65% of the time. Veronica Norton states she is walking.  Today's visit was #: 11 Starting weight: 284 lbs Starting date: 02/03/2019 Today's weight: 283 lbs Today's date: 07/21/2019 Total lbs lost to date: 1 lb Total lbs lost since last in-office visit: 1 lb  Interim History: Veronica Norton denies polyphagia.  She is frustrated with the lack of weight change.  She is interested in bariatric surgery.  Subjective:   1. Vitamin D deficiency Veronica Norton's Vitamin D level was 40.6 on 05/05/2019. She is currently taking prescription vitamin D 50,000 IU each week. She denies nausea, vomiting or muscle weakness.  2. Type 2 diabetes mellitus without complication, without long-term current use of insulin ([place]) Veronica Norton is tolerating Trulicity.  A1c is due after 08/02/2019.  Denies polyphagia.  3. Adjustment disorder with mixed anxiety and depressed mood Veronica Norton is now living alone in the family home since her uncle died.  She was his caregiver, so she is adjusting to the change.  She would like to work on her career.  Her dream is to do social work.  Assessment/Plan:   1. Vitamin D deficiency Low Vitamin D level contributes to fatigue and are associated with obesity, breast, and colon cancer. She agrees to continue to take prescription Vitamin D @50 ,000 IU every week and will follow-up for routine testing of Vitamin D, at least 2-3 times per year to avoid over-replacement.  Orders - Vitamin D, Ergocalciferol, (DRISDOL) 1.25 MG (50000 UNIT) CAPS capsule; Take 1 capsule (50,000 Units total) by mouth every 7 (seven) days.  Dispense: 4 capsule; Refill: 0  2. Type 2 diabetes mellitus without complication, without long-term current use of insulin (HCC) Good blood sugar control is  important to decrease the likelihood of diabetic complications such as nephropathy, neuropathy, limb loss, blindness, coronary artery disease, and death. Intensive lifestyle modification including diet, exercise and weight loss are the first line of treatment for diabetes.   3. Adjustment disorder with mixed anxiety and depressed mood This problem is worsening. Counseling: Intensive lifestyle modifications are the first line treatment for this issue. We discussed several lifestyle modifications today and she will continue to work on diet, exercise and weight loss efforts. We will continue to monitor. Orders and follow up as documented in patient record.  4. Class 3 severe obesity with serious comorbidity and body mass index (BMI) of 45.0 to 49.9 in adult, unspecified obesity type (HCC) Veronica Norton is currently in the action stage of change. As such, her goal is to continue with weight loss efforts. She has agreed to the Category 1 Plan.   Exercise goals: For substantial health benefits, adults should do at least 150 minutes (2 hours and 30 minutes) a week of moderate-intensity, or 75 minutes (1 hour and 15 minutes) a week of vigorous-intensity aerobic physical activity, or an equivalent combination of moderate- and vigorous-intensity aerobic activity. Aerobic activity should be performed in episodes of at least 10 minutes, and preferably, it should be spread throughout the week.  Behavioral modification strategies: increasing lean protein intake and increasing water intake.  Veronica Norton has agreed to follow-up with our clinic in 2 weeks. She was informed of the importance of frequent follow-up visits to maximize her success with intensive lifestyle modifications for her multiple health conditions.  Objective:   Blood pressure 125/80, pulse 91, temperature 98.5 F (36.9 C), temperature source Oral, height 5\' 5"  (1.651 m), weight 283 lb (128.4 kg), last menstrual period 07/07/2019, SpO2 98 %. Body mass index is  47.09 kg/m.  General: Cooperative, alert, well developed, in no acute distress. HEENT: Conjunctivae and lids unremarkable. Cardiovascular: Regular rhythm.  Lungs: Normal work of breathing. Neurologic: No focal deficits.   Lab Results  Component Value Date   CREATININE 0.73 05/05/2019   BUN 10 05/05/2019   NA 138 05/05/2019   K 4.6 05/05/2019   CL 102 05/05/2019   CO2 23 05/05/2019   Lab Results  Component Value Date   ALT 14 05/05/2019   AST 17 05/05/2019   ALKPHOS 86 05/05/2019   BILITOT 0.2 05/05/2019   Lab Results  Component Value Date   HGBA1C 7.3 (H) 05/05/2019   HGBA1C 10.9 (H) 01/15/2019   HGBA1C 7.8 (A) 05/22/2018   HGBA1C 9.1 (H) 02/06/2018   HGBA1C 7.5 (A) 10/10/2017   Lab Results  Component Value Date   TSH 1.480 02/03/2019   Lab Results  Component Value Date   CHOL 118 05/05/2019   HDL 48 05/05/2019   LDLCALC 56 05/05/2019   TRIG 65 05/05/2019   CHOLHDL 2.5 05/05/2019   Lab Results  Component Value Date   WBC 7.9 05/05/2019   HGB 14.9 05/05/2019   HCT 42.8 05/05/2019   MCV 96 05/05/2019   PLT 432 05/05/2019   Attestation Statements:   Reviewed by clinician on day of visit: allergies, medications, problem list, medical history, surgical history, family history, social history, and previous encounter notes.  I, Water quality scientist, CMA, am acting as Location manager for [employer], DO.  I have reviewed the above documentation for accuracy and completeness, and I agree with the above. Briscoe Deutscher, DO

## 2019-08-18 ENCOUNTER — Ambulatory Visit (INDEPENDENT_AMBULATORY_CARE_PROVIDER_SITE_OTHER): Payer: 59 | Admitting: Family Medicine

## 2019-08-18 ENCOUNTER — Other Ambulatory Visit: Payer: Self-pay

## 2019-08-18 ENCOUNTER — Encounter (INDEPENDENT_AMBULATORY_CARE_PROVIDER_SITE_OTHER): Payer: Self-pay | Admitting: Family Medicine

## 2019-08-18 VITALS — BP 106/76 | HR 85 | Temp 98.4°F | Ht 65.0 in | Wt 277.0 lb

## 2019-08-18 DIAGNOSIS — Z9189 Other specified personal risk factors, not elsewhere classified: Secondary | ICD-10-CM | POA: Diagnosis not present

## 2019-08-18 DIAGNOSIS — F3289 Other specified depressive episodes: Secondary | ICD-10-CM | POA: Diagnosis not present

## 2019-08-18 DIAGNOSIS — E119 Type 2 diabetes mellitus without complications: Secondary | ICD-10-CM

## 2019-08-18 DIAGNOSIS — Z6841 Body Mass Index (BMI) 40.0 and over, adult: Secondary | ICD-10-CM

## 2019-08-19 ENCOUNTER — Encounter (INDEPENDENT_AMBULATORY_CARE_PROVIDER_SITE_OTHER): Payer: Self-pay | Admitting: Family Medicine

## 2019-08-19 LAB — COMPREHENSIVE METABOLIC PANEL
ALT: 18 IU/L (ref 0–32)
AST: 14 IU/L (ref 0–40)
Albumin/Globulin Ratio: 1.1 — ABNORMAL LOW (ref 1.2–2.2)
Albumin: 4.1 g/dL (ref 3.8–4.8)
Alkaline Phosphatase: 98 IU/L (ref 48–121)
BUN/Creatinine Ratio: 18 (ref 9–23)
BUN: 13 mg/dL (ref 6–24)
Bilirubin Total: 0.3 mg/dL (ref 0.0–1.2)
CO2: 24 mmol/L (ref 20–29)
Calcium: 9.6 mg/dL (ref 8.7–10.2)
Chloride: 97 mmol/L (ref 96–106)
Creatinine, Ser: 0.73 mg/dL (ref 0.57–1.00)
GFR calc Af Amer: 117 mL/min/{1.73_m2} (ref >59)
GFR calc non Af Amer: 101 mL/min/{1.73_m2} (ref >59)
Globulin, Total: 3.7 g/dL (ref 1.5–4.5)
Glucose: 105 mg/dL — ABNORMAL HIGH (ref 65–99)
Potassium: 4.3 mmol/L (ref 3.5–5.2)
Sodium: 137 mmol/L (ref 134–144)
Total Protein: 7.8 g/dL (ref 6.0–8.5)

## 2019-08-19 LAB — CBC WITH DIFFERENTIAL/PLATELET
Basophils Absolute: 0 10*3/uL (ref 0.0–0.2)
Basos: 0 %
EOS (ABSOLUTE): 0.1 10*3/uL (ref 0.0–0.4)
Eos: 1 %
Hematocrit: 44.2 % (ref 34.0–46.6)
Hemoglobin: 15.2 g/dL (ref 11.1–15.9)
Immature Grans (Abs): 0 10*3/uL (ref 0.0–0.1)
Immature Granulocytes: 0 %
Lymphocytes Absolute: 2.8 10*3/uL (ref 0.7–3.1)
Lymphs: 31 %
MCH: 32.9 pg (ref 26.6–33.0)
MCHC: 34.4 g/dL (ref 31.5–35.7)
MCV: 96 fL (ref 79–97)
Monocytes Absolute: 0.6 10*3/uL (ref 0.1–0.9)
Monocytes: 7 %
Neutrophils Absolute: 5.6 10*3/uL (ref 1.4–7.0)
Neutrophils: 61 %
Platelets: 425 10*3/uL (ref 150–450)
RBC: 4.62 x10E6/uL (ref 3.77–5.28)
RDW: 12.6 % (ref 11.7–15.4)
WBC: 9.3 10*3/uL (ref 3.4–10.8)

## 2019-08-19 LAB — HEMOGLOBIN A1C
Est. average glucose Bld gHb Est-mCnc: 163 mg/dL
Hgb A1c MFr Bld: 7.3 % — ABNORMAL HIGH (ref 4.8–5.6)

## 2019-08-19 NOTE — Telephone Encounter (Signed)
Please advise. Thanks.  

## 2019-08-19 NOTE — Progress Notes (Signed)
Chief Complaint:   OBESITY Veronica Norton is here to discuss her progress with her obesity treatment plan along with follow-up of her obesity related diagnoses. Veronica Norton is on the Category 1 Plan and states she is following her eating plan approximately 60% of the time. Veronica Norton states she is exercising for 0 minutes 0 times per week.  Today's visit was #: 12 Starting weight: 284 lbs Starting date: 02/03/2019 Today's weight: 277 lbs Today's date: 08/18/2019 Total lbs lost to date: 7 llbs Total lbs lost since last in-office visit: 6 lbs  Interim History: Veronica Norton is in school and says it is going well.  She has been seeing Dr. Gwenlyn Saran in Psychology.  She says she is working on finding herself and living alone without a relationship.  She is using OTC cleanse again.  Subjective:   1. Type 2 diabetes mellitus without complication, without long-term current use of insulin (HCC) Medications reviewed. Diabetic ROS: no polyuria or polydipsia, no chest pain, dyspnea or TIA's, no numbness, tingling or pain in extremities.  She is due for labs today.  Lab Results  Component Value Date   HGBA1C 7.3 (H) 08/18/2019   HGBA1C 7.3 (H) 05/05/2019   HGBA1C 10.9 (H) 01/15/2019   Lab Results  Component Value Date   MICROALBUR 0.8 01/15/2019   LDLCALC 56 05/05/2019   CREATININE 0.73 08/18/2019   2. Other depression, with emotional eating Veronica Norton is struggling with emotional eating and using food for comfort to the extent that it is negatively impacting her health. She has been working on behavior modification techniques to help reduce her emotional eating and has been somewhat successful. She shows no sign of suicidal or homicidal ideations.  3. At risk for heart disease Veronica Norton is at a higher than average risk for cardiovascular disease due to obesity.   Assessment/Plan:   1. Type 2 diabetes mellitus without complication, without long-term current use of insulin (HCC) Good blood sugar control is important to  decrease the likelihood of diabetic complications such as nephropathy, neuropathy, limb loss, blindness, coronary artery disease, and death. Intensive lifestyle modification including diet, exercise and weight loss are the first line of treatment for diabetes.   Orders - Comprehensive metabolic panel - CBC with Differential/Platelet - Hemoglobin A1c  2. Other depression, with emotional eating Behavior modification techniques were discussed today to help Namrata deal with her emotional/non-hunger eating behaviors.  Orders and follow up as documented in patient record.   3. At risk for heart disease Veronica Norton was given approximately 15 minutes of coronary artery disease prevention counseling today. She is 44 y.o. female and has risk factors for heart disease including obesity. We discussed intensive lifestyle modifications today with an emphasis on specific weight loss instructions and strategies.   Repetitive spaced learning was employed today to elicit superior memory formation and behavioral change.  4. Class 3 severe obesity with serious comorbidity and body mass index (BMI) of 45.0 to 49.9 in adult, unspecified obesity type (HCC) Veronica Norton is currently in the action stage of change. As such, her goal is to continue with weight loss efforts. She has agreed to the Category 1 Plan.   Exercise goals: For substantial health benefits, adults should do at least 150 minutes (2 hours and 30 minutes) a week of moderate-intensity, or 75 minutes (1 hour and 15 minutes) a week of vigorous-intensity aerobic physical activity, or an equivalent combination of moderate- and vigorous-intensity aerobic activity. Aerobic activity should be performed in episodes of at least 10 minutes,  and preferably, it should be spread throughout the week.  Behavioral modification strategies: increasing lean protein intake.  Veronica Norton has agreed to follow-up with our clinic in 2 weeks. She was informed of the importance of frequent  follow-up visits to maximize her success with intensive lifestyle modifications for her multiple health conditions.   Veronica Norton was informed we would discuss her lab results at her next visit unless there is a critical issue that needs to be addressed sooner. Veronica Norton agreed to keep her next visit at the agreed upon time to discuss these results.  Objective:   Blood pressure 106/76, pulse 85, temperature 98.4 F (36.9 C), temperature source Oral, height 5\' 5"  (1.651 m), weight 277 lb (125.6 kg), SpO2 99 %. Body mass index is 46.1 kg/m.  General: Cooperative, alert, well developed, in no acute distress. HEENT: Conjunctivae and lids unremarkable. Cardiovascular: Regular rhythm.  Lungs: Normal work of breathing. Neurologic: No focal deficits.   Lab Results  Component Value Date   CREATININE 0.73 08/18/2019   BUN 13 08/18/2019   NA 137 08/18/2019   K 4.3 08/18/2019   CL 97 08/18/2019   CO2 24 08/18/2019   Lab Results  Component Value Date   ALT 18 08/18/2019   AST 14 08/18/2019   ALKPHOS 98 08/18/2019   BILITOT 0.3 08/18/2019   Lab Results  Component Value Date   HGBA1C 7.3 (H) 08/18/2019   HGBA1C 7.3 (H) 05/05/2019   HGBA1C 10.9 (H) 01/15/2019   HGBA1C 7.8 (A) 05/22/2018   HGBA1C 9.1 (H) 02/06/2018   Lab Results  Component Value Date   TSH 1.480 02/03/2019   Lab Results  Component Value Date   CHOL 118 05/05/2019   HDL 48 05/05/2019   LDLCALC 56 05/05/2019   TRIG 65 05/05/2019   CHOLHDL 2.5 05/05/2019   Lab Results  Component Value Date   WBC 9.3 08/18/2019   HGB 15.2 08/18/2019   HCT 44.2 08/18/2019   MCV 96 08/18/2019   PLT 425 08/18/2019   Attestation Statements:   Reviewed by clinician on day of visit: allergies, medications, problem list, medical history, surgical history, family history, social history, and previous encounter notes.  I, Water quality scientist, CMA, am acting as transcriptionist for Briscoe Deutscher, DO  I have reviewed the above documentation for  accuracy and completeness, and I agree with the above. Briscoe Deutscher, DO

## 2019-09-01 ENCOUNTER — Encounter (INDEPENDENT_AMBULATORY_CARE_PROVIDER_SITE_OTHER): Payer: Self-pay | Admitting: Family Medicine

## 2019-09-01 ENCOUNTER — Ambulatory Visit (INDEPENDENT_AMBULATORY_CARE_PROVIDER_SITE_OTHER): Payer: 59 | Admitting: Family Medicine

## 2019-09-01 ENCOUNTER — Other Ambulatory Visit: Payer: Self-pay

## 2019-09-01 VITALS — BP 116/79 | HR 96 | Temp 98.6°F | Ht 65.0 in | Wt 281.0 lb

## 2019-09-01 DIAGNOSIS — Z9189 Other specified personal risk factors, not elsewhere classified: Secondary | ICD-10-CM

## 2019-09-01 DIAGNOSIS — E119 Type 2 diabetes mellitus without complications: Secondary | ICD-10-CM | POA: Diagnosis not present

## 2019-09-01 DIAGNOSIS — F3289 Other specified depressive episodes: Secondary | ICD-10-CM

## 2019-09-01 DIAGNOSIS — Z6841 Body Mass Index (BMI) 40.0 and over, adult: Secondary | ICD-10-CM

## 2019-09-02 MED ORDER — TRULICITY 4.5 MG/0.5ML ~~LOC~~ SOAJ: 4.5000 mg | SUBCUTANEOUS | 0 refills | Status: DC

## 2019-09-02 NOTE — Progress Notes (Signed)
Chief Complaint:   OBESITY Veronica Norton is here to discuss her progress with her obesity treatment plan along with follow-up of her obesity related diagnoses. Veronica Norton is on the Category 1 Plan and states she is following her eating plan approximately 60% of the time. Veronica Norton states she has increased her activity level.  Today's visit was #: 58 Starting weight: 284 lbs Starting date: 02/03/2019 Today's weight: 281 lbs Today's date: 09/01/2019 Total lbs lost to date: 3 lbs Total lbs lost since last in-office visit: 0  Interim History: Veronica Norton says that she is premenstrual right now.  She has an opportunity for advancement at work.  She is still in school and is still going to therapy.  She is up 6.5 pounds of fluid today.  Subjective:   1. Type 2 diabetes mellitus without complication, without long-term current use of insulin (HCC) Medications reviewed. Diabetic ROS: no polyuria or polydipsia, no chest pain, dyspnea or TIA's, no numbness, tingling or pain in extremities.  She is taking Trulicity.  Lab Results  Component Value Date   HGBA1C 7.3 (H) 08/18/2019   HGBA1C 7.3 (H) 05/05/2019   HGBA1C 10.9 (H) 01/15/2019   Lab Results  Component Value Date   MICROALBUR 0.8 01/15/2019   LDLCALC 56 05/05/2019   CREATININE 0.73 08/18/2019   2. Other depression, with emotional eating Veronica Norton is struggling with emotional eating and using food for comfort to the extent that it is negatively impacting her health. She has been working on behavior modification techniques to help reduce her emotional eating and has been minimally successful. She shows no sign of suicidal or homicidal ideations.  3. At risk for heart disease Veronica Norton is at a higher than average risk for cardiovascular disease due to obesity.   Assessment/Plan:   1. Type 2 diabetes mellitus without complication, without long-term current use of insulin (HCC) Good blood sugar control is important to decrease the likelihood of diabetic  complications such as nephropathy, neuropathy, limb loss, blindness, coronary artery disease, and death. Intensive lifestyle modification including diet, exercise and weight loss are the first line of treatment for diabetes.  Will refill Trulicity, as per below.  Orders - Dulaglutide (TRULICITY) 4.5 TM/1.9QQ SOPN; Inject 0.5 mLs (4.5 mg total) as directed once a week.  Dispense: 12 pen; Refill: 0  2. Other depression, with emotional eating Behavior modification techniques were discussed today to help Veronica Norton deal with her emotional/non-hunger eating behaviors.  Orders and follow up as documented in patient record.  Continue going to therapy.  3. At risk for heart disease Veronica Norton was given approximately 15 minutes of coronary artery disease prevention counseling today. She is 44 y.o. female and has risk factors for heart disease including obesity. We discussed intensive lifestyle modifications today with an emphasis on specific weight loss instructions and strategies.   Repetitive spaced learning was employed today to elicit superior memory formation and behavioral change.  4. Class 3 severe obesity with serious comorbidity and body mass index (BMI) of 45.0 to 49.9 in adult, unspecified obesity type (HCC) Veronica Norton is currently in the action stage of change. As such, her goal is to continue with weight loss efforts. She has agreed to the Category 1 Plan.   Exercise goals: Recommend trainer.  Behavioral modification strategies: increasing lean protein intake, increasing water intake and emotional eating strategies.  Veronica Norton has agreed to follow-up with our clinic in 3 weeks. She was informed of the importance of frequent follow-up visits to maximize her success with intensive  lifestyle modifications for her multiple health conditions.   Objective:   Blood pressure 116/79, pulse 96, temperature 98.6 F (37 C), temperature source Oral, height 5\' 5"  (1.651 m), weight 281 lb (127.5 kg), SpO2 97 %. Body  mass index is 46.76 kg/m.  General: Cooperative, alert, well developed, in no acute distress. HEENT: Conjunctivae and lids unremarkable. Cardiovascular: Regular rhythm.  Lungs: Normal work of breathing. Neurologic: No focal deficits.   Lab Results  Component Value Date   CREATININE 0.73 08/18/2019   BUN 13 08/18/2019   NA 137 08/18/2019   K 4.3 08/18/2019   CL 97 08/18/2019   CO2 24 08/18/2019   Lab Results  Component Value Date   ALT 18 08/18/2019   AST 14 08/18/2019   ALKPHOS 98 08/18/2019   BILITOT 0.3 08/18/2019   Lab Results  Component Value Date   HGBA1C 7.3 (H) 08/18/2019   HGBA1C 7.3 (H) 05/05/2019   HGBA1C 10.9 (H) 01/15/2019   HGBA1C 7.8 (A) 05/22/2018   HGBA1C 9.1 (H) 02/06/2018   Lab Results  Component Value Date   TSH 1.480 02/03/2019   Lab Results  Component Value Date   CHOL 118 05/05/2019   HDL 48 05/05/2019   LDLCALC 56 05/05/2019   TRIG 65 05/05/2019   CHOLHDL 2.5 05/05/2019   Lab Results  Component Value Date   WBC 9.3 08/18/2019   HGB 15.2 08/18/2019   HCT 44.2 08/18/2019   MCV 96 08/18/2019   PLT 425 08/18/2019   Attestation Statements:   Reviewed by clinician on day of visit: allergies, medications, problem list, medical history, surgical history, family history, social history, and previous encounter notes.  I, Water quality scientist, CMA, am acting as transcriptionist for Briscoe Deutscher, DO  I have reviewed the above documentation for accuracy and completeness, and I agree with the above. Briscoe Deutscher, DO

## 2019-09-11 ENCOUNTER — Encounter (INDEPENDENT_AMBULATORY_CARE_PROVIDER_SITE_OTHER): Payer: Self-pay

## 2019-09-16 ENCOUNTER — Other Ambulatory Visit: Payer: Self-pay

## 2019-09-16 ENCOUNTER — Ambulatory Visit (INDEPENDENT_AMBULATORY_CARE_PROVIDER_SITE_OTHER): Payer: 59 | Admitting: Family Medicine

## 2019-09-16 ENCOUNTER — Encounter (INDEPENDENT_AMBULATORY_CARE_PROVIDER_SITE_OTHER): Payer: Self-pay | Admitting: Family Medicine

## 2019-09-16 VITALS — BP 104/72 | HR 81 | Temp 98.1°F | Ht 65.0 in | Wt 278.0 lb

## 2019-09-16 DIAGNOSIS — Z9189 Other specified personal risk factors, not elsewhere classified: Secondary | ICD-10-CM | POA: Diagnosis not present

## 2019-09-16 DIAGNOSIS — I10 Essential (primary) hypertension: Secondary | ICD-10-CM

## 2019-09-16 DIAGNOSIS — E119 Type 2 diabetes mellitus without complications: Secondary | ICD-10-CM

## 2019-09-16 DIAGNOSIS — Z6841 Body Mass Index (BMI) 40.0 and over, adult: Secondary | ICD-10-CM

## 2019-09-16 DIAGNOSIS — E1159 Type 2 diabetes mellitus with other circulatory complications: Secondary | ICD-10-CM

## 2019-09-16 DIAGNOSIS — E559 Vitamin D deficiency, unspecified: Secondary | ICD-10-CM

## 2019-09-16 DIAGNOSIS — I152 Hypertension secondary to endocrine disorders: Secondary | ICD-10-CM

## 2019-09-17 NOTE — Progress Notes (Signed)
Chief Complaint:   OBESITY Veronica Norton is here to discuss her progress with her obesity treatment plan along with follow-up of her obesity related diagnoses. Veronica Norton is on the Category 1 Plan and states she is following her eating plan approximately 50-60% of the time. Veronica Norton states she is exercising for 0 minutes 0 times per week.  Today's visit was #: 14 Starting weight: 284 lbs Starting date: 02/03/2019 Today's weight: 278 lbs Today's date: 09/16/2019 Total lbs lost to date: 6 lbs Total lbs lost since last in-office visit: 3 lbs  Interim History: Veronica Norton says her mood has been fairly good.  Her bowel movements have been stable.  She reports that she has been making time for herself more often.  Subjective:   1. Type 2 diabetes mellitus without complication, without long-term current use of insulin (HCC) Medications reviewed. Diabetic ROS: no polyuria or polydipsia, no chest pain, dyspnea or TIA's, no numbness, tingling or pain in extremities.   Lab Results  Component Value Date   HGBA1C 7.3 (H) 08/18/2019   HGBA1C 7.3 (H) 05/05/2019   HGBA1C 10.9 (H) 01/15/2019   Lab Results  Component Value Date   MICROALBUR 0.8 01/15/2019   LDLCALC 56 05/05/2019   CREATININE 0.73 08/18/2019   2. Vitamin D deficiency Veronica Norton's Vitamin D level was 40.6 on 05/05/2019. She is currently taking prescription vitamin D 50,000 IU each week. She denies nausea, vomiting or muscle weakness.  3. Hypertension associated with diabetes ([place]) Review: taking medications as instructed, no medication side effects noted, no chest pain on exertion, no dyspnea on exertion, no swelling of ankles.   BP Readings from Last 3 Encounters:  09/16/19 104/72  09/01/19 116/79  08/18/19 106/76   4. At risk for constipation Glenis is at increased risk for constipation due to her medications.  Veronica Norton denies hard, infrequent stools currently.   Assessment/Plan:   1. Type 2 diabetes mellitus without complication, without  long-term current use of insulin (HCC) Good blood sugar control is important to decrease the likelihood of diabetic complications such as nephropathy, neuropathy, limb loss, blindness, coronary artery disease, and death. Intensive lifestyle modification including diet, exercise and weight loss are the first line of treatment for diabetes.   2. Vitamin D deficiency Low Vitamin D level contributes to fatigue and are associated with obesity, breast, and colon cancer. She agrees to continue to take prescription Vitamin D @50 ,000 IU every week and will follow-up for routine testing of Vitamin D, at least 2-3 times per year to avoid over-replacement.  3. Hypertension associated with diabetes ([place]) Veronica Norton is working on healthy weight loss and exercise to improve blood pressure control. We will watch for signs of hypotension as she continues her lifestyle modifications.  4. At risk for constipation Jannetta was given approximately 15 minutes of counseling today regarding prevention of constipation. She was encouraged to increase water and fiber intake.   5. Class 3 severe obesity with serious comorbidity and body mass index (BMI) of 45.0 to 49.9 in adult, unspecified obesity type (HCC) Veronica Norton is currently in the action stage of change. As such, her goal is to continue with weight loss efforts. She has agreed to the Category 1 Plan.   Exercise goals: For substantial health benefits, adults should do at least 150 minutes (2 hours and 30 minutes) a week of moderate-intensity, or 75 minutes (1 hour and 15 minutes) a week of vigorous-intensity aerobic physical activity, or an equivalent combination of moderate- and vigorous-intensity aerobic activity. Aerobic activity  should be performed in episodes of at least 10 minutes, and preferably, it should be spread throughout the week. Get information on personal trainer.  Behavioral modification strategies: increasing lean protein intake and decreasing simple  carbohydrates.  Veronica Norton has agreed to follow-up with our clinic in 3 weeks. She was informed of the importance of frequent follow-up visits to maximize her success with intensive lifestyle modifications for her multiple health conditions.   Objective:   Blood pressure 104/72, pulse 81, temperature 98.1 F (36.7 C), temperature source Oral, height 5\' 5"  (1.651 m), weight 278 lb (126.1 kg), SpO2 97 %. Body mass index is 46.26 kg/m.  General: Cooperative, alert, well developed, in no acute distress. HEENT: Conjunctivae and lids unremarkable. Cardiovascular: Regular rhythm.  Lungs: Normal work of breathing. Neurologic: No focal deficits.   Lab Results  Component Value Date   CREATININE 0.73 08/18/2019   BUN 13 08/18/2019   NA 137 08/18/2019   K 4.3 08/18/2019   CL 97 08/18/2019   CO2 24 08/18/2019   Lab Results  Component Value Date   ALT 18 08/18/2019   AST 14 08/18/2019   ALKPHOS 98 08/18/2019   BILITOT 0.3 08/18/2019   Lab Results  Component Value Date   HGBA1C 7.3 (H) 08/18/2019   HGBA1C 7.3 (H) 05/05/2019   HGBA1C 10.9 (H) 01/15/2019   HGBA1C 7.8 (A) 05/22/2018   HGBA1C 9.1 (H) 02/06/2018   Lab Results  Component Value Date   TSH 1.480 02/03/2019   Lab Results  Component Value Date   CHOL 118 05/05/2019   HDL 48 05/05/2019   LDLCALC 56 05/05/2019   TRIG 65 05/05/2019   CHOLHDL 2.5 05/05/2019   Lab Results  Component Value Date   WBC 9.3 08/18/2019   HGB 15.2 08/18/2019   HCT 44.2 08/18/2019   MCV 96 08/18/2019   PLT 425 08/18/2019   Attestation Statements:   Reviewed by clinician on day of visit: allergies, medications, problem list, medical history, surgical history, family history, social history, and previous encounter notes.  I, Water quality scientist, CMA, am acting as transcriptionist for Briscoe Deutscher, DO  I have reviewed the above documentation for accuracy and completeness, and I agree with the above. Briscoe Deutscher, DO

## 2019-09-24 ENCOUNTER — Ambulatory Visit: Payer: 59 | Admitting: Family Medicine

## 2019-09-24 ENCOUNTER — Other Ambulatory Visit: Payer: Self-pay

## 2019-09-24 ENCOUNTER — Encounter: Payer: Self-pay | Admitting: Family Medicine

## 2019-09-24 VITALS — BP 136/84 | HR 96 | Temp 98.3°F | Ht 65.0 in | Wt 282.2 lb

## 2019-09-24 DIAGNOSIS — F419 Anxiety disorder, unspecified: Secondary | ICD-10-CM | POA: Diagnosis not present

## 2019-09-24 DIAGNOSIS — Z1159 Encounter for screening for other viral diseases: Secondary | ICD-10-CM

## 2019-09-24 DIAGNOSIS — I10 Essential (primary) hypertension: Secondary | ICD-10-CM

## 2019-09-24 DIAGNOSIS — E669 Obesity, unspecified: Secondary | ICD-10-CM

## 2019-09-24 DIAGNOSIS — F329 Major depressive disorder, single episode, unspecified: Secondary | ICD-10-CM

## 2019-09-24 DIAGNOSIS — R600 Localized edema: Secondary | ICD-10-CM | POA: Diagnosis not present

## 2019-09-24 DIAGNOSIS — E1169 Type 2 diabetes mellitus with other specified complication: Secondary | ICD-10-CM

## 2019-09-24 DIAGNOSIS — F32A Depression, unspecified: Secondary | ICD-10-CM

## 2019-09-24 LAB — TSH: TSH: 1.49 u[IU]/mL (ref 0.35–4.50)

## 2019-09-24 NOTE — Assessment & Plan Note (Signed)
Trace pitting edema on exam today.  Recent CBC and CMP reviewed with no obvious cause.  Check TSH.

## 2019-09-24 NOTE — Patient Instructions (Signed)
Nice to see you. Please buy a blood pressure cuff and start checking your blood pressure several times a week.  We will see you back in 2 months to recheck.  Please bring in a list of your readings. We will get labs today.

## 2019-09-24 NOTE — Assessment & Plan Note (Signed)
Progressively improving with therapy.  I encouraged her to continue that.

## 2019-09-24 NOTE — Assessment & Plan Note (Signed)
Slightly above goal in the office.  Discussed that she needs to start checking this at home with a goal of less than 130/80.  We will have her return in 2 months for recheck.  Continue current medication.

## 2019-09-24 NOTE — Assessment & Plan Note (Signed)
She will continue to see Dr. Juleen China.  I did encourage the patient to continue with walking and to try to build it into her schedule.

## 2019-09-24 NOTE — Assessment & Plan Note (Signed)
Recent A1c unchanged.  Encouraged continued exercise and dietary changes.  She will continue her current medication regimen.

## 2019-09-24 NOTE — Progress Notes (Signed)
Tommi Rumps, MD Phone: [phone]  Veronica Norton is a 44 y.o. female who presents today for f/u.  HYPERTENSION  Disease Monitoring  Home BP Monitoring not checking Chest pain- no    Dyspnea- no Medications  Compliance-  Taking HCTZ, spironolactone.  Edema- no  DIABETES Disease Monitoring: Blood Sugar ranges-not checking Polyuria/phagia/dipsia- no      Optho- has an upcoming appointment Medications: Compliance- taking trulicity, metformin Hypoglycemic symptoms- maybe one time, had not been drinking enough fluids, ate candy and improved.   Depression: Patient notes her uncle passed away back in 06/09/2022.  She notes its been difficult to deal with.  She notes she is progressively getting better.  She is seeing Dr. Zella Ball for therapy.  Notes that has been quite helpful as they have been able to work on her.  Obesity: She continues to [facility] [place].  She is gotten back on track with walking recently.  Oftentimes does not eat any breakfast.  Tries to eat something healthy for lunch.     Social History   Tobacco Use  Smoking Status Never Smoker  Smokeless Tobacco Never Used     ROS see history of present illness  Objective  Physical Exam Vitals:   09/24/19 1426  BP: 136/84  Pulse: 96  Temp: 98.3 F (36.8 C)  SpO2: 98%    BP Readings from Last 3 Encounters:  09/24/19 136/84  09/16/19 104/72  09/01/19 116/79   Wt Readings from Last 3 Encounters:  09/24/19 282 lb 3.2 oz (128 kg)  09/16/19 278 lb (126.1 kg)  09/01/19 281 lb (127.5 kg)    Physical Exam Constitutional:      General: She is not in acute distress.    Appearance: She is not diaphoretic.  Cardiovascular:     Rate and Rhythm: Normal rate and regular rhythm.     Heart sounds: Normal heart sounds.  Pulmonary:     Effort: Pulmonary effort is normal.     Breath sounds: Normal breath sounds.  Musculoskeletal:     Comments: Trace pitting edema  bilateral lower extremities distal to the midshin  Skin:    General: Skin is warm and dry.  Neurological:     Mental Status: She is alert.      Assessment/Plan: Please see individual problem list.  Hypertension Slightly above goal in the office.  Discussed that she needs to start checking this at home with a goal of less than 130/80.  We will have her return in 2 months for recheck.  Continue current medication.  Bilateral leg edema Trace pitting edema on exam today.  Recent CBC and CMP reviewed with no obvious cause.  Check TSH.  Anxiety and depression Progressively improving with therapy.  I encouraged her to continue that.  Severe obesity (BMI >= 40) She will continue to see Dr. Juleen China.  I did encourage the patient to continue with walking and to try to build it into her schedule.  Diabetes mellitus type 2 in obese Recent A1c unchanged.  Encouraged continued exercise and dietary changes.  She will continue her current medication regimen.   Health Maintenance: Hepatitis C screening ordered.  Orders Placed This Encounter  Procedures  . Hepatitis C Antibody  . TSH    No orders of the defined types were placed in this encounter.   This visit occurred during the SARS-CoV-2 public health emergency.  Safety protocols were in place, including screening questions prior to the visit, additional  usage of staff PPE, and extensive cleaning of exam room while observing appropriate contact time as indicated for disinfecting solutions.    Tommi Rumps, MD [place]

## 2019-09-25 ENCOUNTER — Encounter: Payer: Self-pay | Admitting: Family Medicine

## 2019-09-25 LAB — HEPATITIS C ANTIBODY
Hepatitis C Ab: NONREACTIVE
SIGNAL TO CUT-OFF: 0.02 (ref <1.00)

## 2019-10-01 ENCOUNTER — Ambulatory Visit (INDEPENDENT_AMBULATORY_CARE_PROVIDER_SITE_OTHER): Payer: 59 | Admitting: Family Medicine

## 2019-10-02 LAB — HM DIABETES EYE EXAM

## 2019-10-05 ENCOUNTER — Other Ambulatory Visit: Payer: Self-pay | Admitting: Dermatology

## 2019-10-05 ENCOUNTER — Other Ambulatory Visit: Payer: Self-pay | Admitting: Family Medicine

## 2019-10-05 DIAGNOSIS — L709 Acne, unspecified: Secondary | ICD-10-CM

## 2019-10-15 ENCOUNTER — Ambulatory Visit (INDEPENDENT_AMBULATORY_CARE_PROVIDER_SITE_OTHER): Payer: 59 | Admitting: Family Medicine

## 2019-10-15 ENCOUNTER — Encounter (INDEPENDENT_AMBULATORY_CARE_PROVIDER_SITE_OTHER): Payer: Self-pay | Admitting: Family Medicine

## 2019-10-15 ENCOUNTER — Other Ambulatory Visit: Payer: Self-pay

## 2019-10-15 VITALS — BP 111/81 | HR 90 | Temp 98.4°F | Ht 65.0 in | Wt 282.0 lb

## 2019-10-15 DIAGNOSIS — E1169 Type 2 diabetes mellitus with other specified complication: Secondary | ICD-10-CM

## 2019-10-15 DIAGNOSIS — Z6841 Body Mass Index (BMI) 40.0 and over, adult: Secondary | ICD-10-CM

## 2019-10-15 DIAGNOSIS — F419 Anxiety disorder, unspecified: Secondary | ICD-10-CM

## 2019-10-15 DIAGNOSIS — F329 Major depressive disorder, single episode, unspecified: Secondary | ICD-10-CM

## 2019-10-15 DIAGNOSIS — F32A Depression, unspecified: Secondary | ICD-10-CM

## 2019-10-16 ENCOUNTER — Ambulatory Visit: Payer: 59 | Admitting: Pharmacist

## 2019-10-16 DIAGNOSIS — E1169 Type 2 diabetes mellitus with other specified complication: Secondary | ICD-10-CM

## 2019-10-16 DIAGNOSIS — I10 Essential (primary) hypertension: Secondary | ICD-10-CM

## 2019-10-16 NOTE — Chronic Care Management (AMB) (Signed)
Chronic Care Management   Follow Up Note   10/16/2019 Name: Veronica Norton MRN: [id number] DOB: 08-07-1975  Referred by: Leone Haven, MD Reason for referral : Chronic Care Management (Medication Management)   Veronica Norton is a 44 y.o. year old female who is a primary care patient of Caryl Bis, Angela Adam, MD. The CCM team was consulted for assistance with chronic disease management and care coordination needs.    Contacted patient for medication management review.  Review of patient status, including review of consultants reports, relevant laboratory and other test results, and collaboration with appropriate care team members and the patient's provider was performed as part of comprehensive patient evaluation and provision of chronic care management services.    SDOH (Social Determinants of Health) assessments performed: Yes See Care Plan activities for detailed interventions related to SDOH)  SDOH Interventions     Most Recent Value  SDOH Interventions  Physical Activity Interventions Other (Comments)  [encouraged continued committment to exercise]       Outpatient Encounter Medications as of 10/16/2019  Medication Sig  . APPLE CIDER VINEGAR PO Take by mouth. 2-3 gummies 2 times per day  . Dapsone (ACZONE) 7.5 % GEL Apply small amount once daily to affected areas  . Dulaglutide (TRULICITY) 4.5 RA/0.7MA SOPN Inject 0.5 mLs (4.5 mg total) as directed once a week.  Marland Kitchen glucose blood (FREESTYLE TEST STRIPS) test strip Check sugar bid  . glucose monitoring kit (FREESTYLE) monitoring kit Use as directed to check sugars bid  . hydrochlorothiazide (MICROZIDE) 12.5 MG capsule TAKE 1 CAPSULE BY MOUTH EVERY DAY  . Lancets (ONETOUCH ULTRASOFT) lancets Use as instructed  . metFORMIN (GLUCOPHAGE-XR) 500 MG 24 hr tablet Take 2 tablets (1,000 mg total) by mouth 2 (two) times daily.  . norethindrone (CAMILA) 0.35 MG tablet Take 1 tablet (0.35 mg total) by mouth daily.  Marland Kitchen  spironolactone (ALDACTONE) 100 MG tablet TAKE 1 TABLET BY MOUTH EVERY DAY  . fluticasone (CUTIVATE) 0.05 % cream APPLY TO AFFECTED AREA UP TO TWICE A DAY AS NEEDED FOR RASH/ITCHING (Patient not taking: Reported on 10/16/2019)  . Vitamin D, Ergocalciferol, (DRISDOL) 1.25 MG (50000 UNIT) CAPS capsule Take 1 capsule (50,000 Units total) by mouth every 7 (seven) days. (Patient not taking: Reported on 10/16/2019)  . [DISCONTINUED] Ascorbic Acid (VITAMIN C ADULT GUMMIES PO) Take by mouth. (Patient not taking: Reported on 10/16/2019)  . [DISCONTINUED] Continuous Blood Gluc Sensor (FREESTYLE LIBRE 14 DAY SENSOR) MISC Inject 1 Device into the skin every 14 (fourteen) days.   No facility-administered encounter medications on file as of 10/16/2019.     Objective:   Goals Addressed              This Visit's Progress     Patient Stated   .  "I want to get my sugars better under control" (pt-stated)        CARE PLAN ENTRY (see longtitudinal plan of care for additional care plan information)  Current Barriers:  . 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 Diabetes: uncontrolled; most recent 2.6%, complicated by HTN . Current antihyperglycemic regimen: metformin 3335 mg BID, Trulicity 4.5 mg weekly . Current glucose readings: not checking right now, does not like finger sticks. Discussed CGM previously, but did not like the concept of people being able to see her sensor. Notes that she did talk to her insurance and DexCom  is preferred . Current exercise:  o Started 09/25/19- walks for 30 minutes 3 or 4 times weekly . Cardiovascular risk reduction o Current hypertensive regimen: HCTZ 12.5 mg daily, spironolactone 100 mg QAM; has started checking BP readings at home but does not remember any readings, notes differences between L and R arm readings o Current hyperlipidemia regimen: none; ASCVD risk  unable to be calculated d/t lower cholesterol reading (w/o therapy) . Supplements: notes that she has not been taking Vitamin D weekly. Wonders if she needs Vitamin B12 injections to help w/ energy   Pharmacist Clinical Goal(s):  . Over the next 90 days, patient will work with PharmD and primary care provider to address optimized glycemic management  Interventions: . Comprehensive medication review performed, medication list updated in electronic medical record . Inter-disciplinary care team collaboration (see longitudinal plan of care) . Dicussed differences between DexCom and FreeStyle Libre. Reviewed that DexCom is worn under her shirt, so less likely to be seen by those around her. Reader is on her phone, rather than having a physical reader she has to scan. She is interested in DexCom, but plans to do some research and get back to me if she would like a prescription. We discussed the benefit of dietary impact on glucose readings through utilizing CGM . Discussed benefit of checking home BP readings. Reviewing clinic readings; BP well controlled at these office visits. She notes that she is a bit more anxious when she comes to clinic visits here. Encouraged to check some home readings before her next f/u with PCP next month.  . Encouraged to discuss w/ Dr. Wallace that she has not been taking Vitamin D (last level was back in February). Reviewed w/ patient that her last B12 level was at goal, however, encouraged to discuss w/ Dr. Wallace as well.  Patient Self Care Activities:  . Patient will check use CGM to check glucose, document, and provide at future appointments . Patient will take medications as prescribed . Patient will report any questions or concerns to provider   Please see past updates related to this goal by clicking on the "Past Updates" button in the selected goal          Plan:  - Scheduled f/u call in ~ 8 weeks  Catie Travis, PharmD, BCACP, CPP Clinical  Pharmacist [facility] [place] [phone]   

## 2019-10-16 NOTE — Progress Notes (Signed)
Chief Complaint:   OBESITY Veronica Norton is here to discuss her progress with her obesity treatment plan along with follow-up of her obesity related diagnoses. Veronica Norton is on the Category 1 Plan and states she is following her eating plan approximately 60% of the time. Veronica Norton states she is walking for 30 minutes 3-4 times per week.  Today's visit was #: 15 Starting weight: 284 lbs Starting date: 02/03/2019 Today's weight: 282 lbs Today's date: 10/15/2019 Total lbs lost to date: 2 lbs Total lbs lost since last in-office visit: 0  Interim History: Veronica Norton says she is getting some walking in a few days a week.  She got an 'A' in her last class.  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  She is still seeing Dr. Gwenlyn Norton in Psychology.    Subjective:   1. Type 2 diabetes mellitus with other specified complication, without long-term current use of insulin (HCC) Medications reviewed. Diabetic ROS: no polyuria or polydipsia, no chest pain, dyspnea or TIA's, no numbness, tingling or pain in extremities.   Lab Results  Component Value Date   HGBA1C 7.3 (H) 08/18/2019   HGBA1C 7.3 (H) 05/05/2019   HGBA1C 10.9 (H) 01/15/2019   Lab Results  Component Value Date   MICROALBUR 0.8 01/15/2019   LDLCALC 56 05/05/2019   CREATININE 0.73 08/18/2019   2. Anxiety and depression She has been working on behavior modification techniques to help reduce her emotional eating and has been somewhat successful. She shows no sign of suicidal or homicidal ideations.  Assessment/Plan:   1. Type 2 diabetes mellitus with other specified complication, without long-term current use of insulin (HCC) Not at goal. Behavior modification techniques were discussed today to help Veronica Norton deal with her emotional/non-hunger eating behaviors.  Orders and follow up as documented in patient record.   2. Anxiety and depression Not at goal. Behavior modification techniques were discussed today to help Veronica Norton deal with her  emotional/non-hunger eating behaviors.  Orders and follow up as documented in patient record.   3. Class 3 severe obesity with serious comorbidity and body mass index (BMI) of 45.0 to 49.9 in adult, unspecified obesity type (HCC) Veronica Norton is currently in the action stage of change. As such, her goal is to continue with weight loss efforts. She has agreed to the Category 1 Plan.   Exercise goals: For substantial health benefits, adults should do at least 150 minutes (2 hours and 30 minutes) a week of moderate-intensity, or 75 minutes (1 hour and 15 minutes) a week of vigorous-intensity aerobic physical activity, or an equivalent combination of moderate- and vigorous-intensity aerobic activity. Aerobic activity should be performed in episodes of at least 10 minutes, and preferably, it should be spread throughout the week.  Behavioral modification strategies: increasing lean protein intake, increasing high fiber foods, no skipping meals and planning for success.  Veronica Norton has agreed to follow-up with our clinic in 4 weeks. She was informed of the importance of frequent follow-up visits to maximize her success with intensive lifestyle modifications for her multiple health conditions.   Objective:   Blood pressure 111/81, pulse 90, temperature 98.4 F (36.9 C), temperature source Oral, height 5\' 5"  (1.651 m), weight 282 lb (127.9 kg), SpO2 97 %. Body mass index is 46.93 kg/m.  General: Cooperative, alert, well developed, in no acute distress. HEENT: Conjunctivae and lids unremarkable. Cardiovascular: Regular rhythm.  Lungs: Normal work of breathing. Neurologic: No focal deficits.   Lab Results  Component Value Date  CREATININE 0.73 08/18/2019   BUN 13 08/18/2019   NA 137 08/18/2019   K 4.3 08/18/2019   CL 97 08/18/2019   CO2 24 08/18/2019   Lab Results  Component Value Date   ALT 18 08/18/2019   AST 14 08/18/2019   ALKPHOS 98 08/18/2019   BILITOT 0.3 08/18/2019   Lab Results  Component  Value Date   HGBA1C 7.3 (H) 08/18/2019   HGBA1C 7.3 (H) 05/05/2019   HGBA1C 10.9 (H) 01/15/2019   HGBA1C 7.8 (A) 05/22/2018   HGBA1C 9.1 (H) 02/06/2018   Lab Results  Component Value Date   TSH 1.49 09/24/2019   Lab Results  Component Value Date   CHOL 118 05/05/2019   HDL 48 05/05/2019   LDLCALC 56 05/05/2019   TRIG 65 05/05/2019   CHOLHDL 2.5 05/05/2019   Lab Results  Component Value Date   WBC 9.3 08/18/2019   HGB 15.2 08/18/2019   HCT 44.2 08/18/2019   MCV 96 08/18/2019   PLT 425 08/18/2019   Attestation Statements:   Reviewed by clinician on day of visit: allergies, medications, problem list, medical history, surgical history, family history, social history, and previous encounter notes.  Time spent on visit including pre-visit chart review and post-visit care and charting was 25 minutes.   I, Water quality scientist, CMA, am acting as transcriptionist for Briscoe Deutscher, DO  I have reviewed the above documentation for accuracy and completeness, and I agree with the above. Briscoe Deutscher, DO

## 2019-10-16 NOTE — Patient Instructions (Signed)
Visit Information  Goals Addressed              This Visit's Progress     Patient Stated   .  "I want to get my sugars better under control" (pt-stated)        CARE PLAN ENTRY (see longtitudinal plan of care for additional care plan information)  Current Barriers:  . 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 Diabetes: uncontrolled; most recent 1.6%, complicated by HTN . Current antihyperglycemic regimen: metformin 1096 mg BID, Trulicity 4.5 mg weekly . Current glucose readings: not checking right now, does not like finger sticks. Discussed CGM previously, but did not like the concept of people being able to see her sensor. Notes that she did talk to her insurance and DexCom is preferred . Current exercise:  o Started 09/25/19- walks for 30 minutes 3 or 4 times weekly . Cardiovascular risk reduction o Current hypertensive regimen: HCTZ 12.5 mg daily, spironolactone 100 mg QAM; has started checking BP readings at home but does not remember any readings, notes differences between L and R arm readings o Current hyperlipidemia regimen: none; ASCVD risk unable to be calculated d/t lower cholesterol reading (w/o therapy) . Supplements: notes that she has not been taking Vitamin D weekly. Wonders if she needs Vitamin B12 injections to help w/ energy   Pharmacist Clinical Goal(s):  Marland Kitchen Over the next 90 days, patient will work with PharmD and primary care provider to address optimized glycemic management  Interventions: . Comprehensive medication review performed, medication list updated in electronic medical record . Inter-disciplinary care team collaboration (see longitudinal plan of care) . Dicussed differences between [facility] and [employer]. Reviewed that DexCom is worn under her shirt, so less likely to be seen by those around her. Reader is on her phone, rather than having a  physical reader she has to scan. She is interested in [facility], but plans to do some research and get back to me if she would like a prescription. We discussed the benefit of dietary impact on glucose readings through utilizing CGM . Discussed benefit of checking home BP readings. Reviewing clinic readings; BP well controlled at these office visits. She notes that she is a bit more anxious when she comes to clinic visits here. Encouraged to check some home readings before her next f/u with PCP next month.  . Encouraged to discuss w/ Dr. Juleen China that she has not been taking Vitamin D (last level was back in February). Reviewed w/ patient that her last B12 level was at goal, however, encouraged to discuss w/ Dr. Juleen China as well.  Patient Self Care Activities:  . Patient will check use CGM to check glucose, document, and provide at future appointments . Patient will take medications as prescribed . Patient will report any questions or concerns to provider   Please see past updates related to this goal by clicking on the "Past Updates" button in the selected goal         The patient verbalized understanding of instructions provided today and declined a print copy of patient instruction materials.   Plan:  - Scheduled f/u call in ~ 8 weeks  Catie Darnelle Maffucci, PharmD, [place], [place] Pharmacist [place] [phone]

## 2019-11-13 ENCOUNTER — Ambulatory Visit (INDEPENDENT_AMBULATORY_CARE_PROVIDER_SITE_OTHER): Payer: 59 | Admitting: Family Medicine

## 2019-11-13 ENCOUNTER — Other Ambulatory Visit: Payer: Self-pay

## 2019-11-13 ENCOUNTER — Encounter (INDEPENDENT_AMBULATORY_CARE_PROVIDER_SITE_OTHER): Payer: Self-pay | Admitting: Family Medicine

## 2019-11-13 VITALS — BP 119/82 | HR 99 | Temp 98.2°F | Ht 65.0 in | Wt 282.0 lb

## 2019-11-13 DIAGNOSIS — Z6841 Body Mass Index (BMI) 40.0 and over, adult: Secondary | ICD-10-CM

## 2019-11-13 DIAGNOSIS — E559 Vitamin D deficiency, unspecified: Secondary | ICD-10-CM | POA: Diagnosis not present

## 2019-11-13 DIAGNOSIS — E1169 Type 2 diabetes mellitus with other specified complication: Secondary | ICD-10-CM | POA: Diagnosis not present

## 2019-11-13 DIAGNOSIS — E1159 Type 2 diabetes mellitus with other circulatory complications: Secondary | ICD-10-CM

## 2019-11-13 DIAGNOSIS — I152 Hypertension secondary to endocrine disorders: Secondary | ICD-10-CM

## 2019-11-13 DIAGNOSIS — I10 Essential (primary) hypertension: Secondary | ICD-10-CM

## 2019-11-18 NOTE — Progress Notes (Signed)
Chief Complaint:   OBESITY Veronica Norton is here to discuss her progress with her obesity treatment plan along with follow-up of her obesity related diagnoses. Veronica Norton is on the Category 1 Plan and states she is following her eating plan approximately 60% of the time. Veronica Norton states she is walking for 30+ minutes 3-4 times per week.  Today's visit was #: 35 Starting weight: 284 lbs Starting date: 02/03/2019 Today's weight: 282 lbs Today's date: 11/13/2019 Total lbs lost to date: 2 lbs Total lbs lost since last in-office visit: 0  Interim History: Since our last visit, Veronica Norton has been working on school and work balance. She has started dating a new man. She continues to adjust to living alone.   Assessment/Plan:   1. Hypertension associated with diabetes ([place]) Veronica Norton is working on healthy weight loss and exercise to improve blood pressure control. We will watch for signs of hypotension as she continues her lifestyle modifications.  2. Type 2 diabetes mellitus with other specified complication, without long-term current use of insulin (HCC) Diabetes Mellitus: needs improvement Issues reviewed with her: blood sugar goals, complications of diabetes mellitus, hypoglycemia prevention and treatment, exercise, nutrition, and carbohydrate counting.   Lab Results  Component Value Date   HGBA1C 7.3 (H) 08/18/2019   HGBA1C 7.3 (H) 05/05/2019   HGBA1C 10.9 (H) 01/15/2019   Lab Results  Component Value Date   MICROALBUR 0.8 01/15/2019   LDLCALC 56 05/05/2019   CREATININE 0.73 08/18/2019   Health Maintenance BP goal < 130/80, HDL > 40 and TG < 150. All patients with diabetes should be on a statin unless contraindicated. Dietary recommendations: < 150 g carbohydrates daily. Physical activity recommendations: as tolerated aerobic and strength exercises.  3. Vitamin D deficiency Current vitamin D is 40.6, tested on 05/05/2019. Not at goal. Optimal goal > 50 ng/dL. There is also evidence to support a goal  of >70 ng/dL in patients with cancer and heart disease. Plan: Continue Vitamin D @50 ,000 IU every week with follow-up for routine testing of Vitamin D at least 2-3 times per year to avoid over-replacement.  4. Class 3 severe obesity with serious comorbidity and body mass index (BMI) of 45.0 to 49.9 in adult, unspecified obesity type (HCC) Veronica Norton is currently in the action stage of change. As such, her goal is to continue with weight loss efforts. She has agreed to the Category 1 Plan.   Exercise goals: For substantial health benefits, adults should do at least 150 minutes (2 hours and 30 minutes) a week of moderate-intensity, or 75 minutes (1 hour and 15 minutes) a week of vigorous-intensity aerobic physical activity, or an equivalent combination of moderate- and vigorous-intensity aerobic activity. Aerobic activity should be performed in episodes of at least 10 minutes, and preferably, it should be spread throughout the week.  Behavioral modification strategies: no skipping meals and emotional eating strategies.  Veronica Norton has agreed to follow-up with our clinic in 3 weeks. She was informed of the importance of frequent follow-up visits to maximize her success with intensive lifestyle modifications for her multiple health conditions.   Objective:   Blood pressure 119/82, pulse 99, temperature 98.2 F (36.8 C), temperature source Oral, height 5\' 5"  (1.651 m), weight 282 lb (127.9 kg), SpO2 97 %. Body mass index is 46.93 kg/m.  General: Cooperative, alert, well developed, in no acute distress. HEENT: Conjunctivae and lids unremarkable. Cardiovascular: Regular rhythm.  Lungs: Normal work of breathing. Neurologic: No focal deficits.   Lab Results  Component Value  Date   CREATININE 0.73 08/18/2019   BUN 13 08/18/2019   NA 137 08/18/2019   K 4.3 08/18/2019   CL 97 08/18/2019   CO2 24 08/18/2019   Lab Results  Component Value Date   ALT 18 08/18/2019   AST 14 08/18/2019   ALKPHOS 98  08/18/2019   BILITOT 0.3 08/18/2019   Lab Results  Component Value Date   HGBA1C 7.3 (H) 08/18/2019   HGBA1C 7.3 (H) 05/05/2019   HGBA1C 10.9 (H) 01/15/2019   HGBA1C 7.8 (A) 05/22/2018   HGBA1C 9.1 (H) 02/06/2018   Lab Results  Component Value Date   TSH 1.49 09/24/2019   Lab Results  Component Value Date   CHOL 118 05/05/2019   HDL 48 05/05/2019   LDLCALC 56 05/05/2019   TRIG 65 05/05/2019   CHOLHDL 2.5 05/05/2019   Lab Results  Component Value Date   WBC 9.3 08/18/2019   HGB 15.2 08/18/2019   HCT 44.2 08/18/2019   MCV 96 08/18/2019   PLT 425 08/18/2019   Attestation Statements:   Reviewed by clinician on day of visit: allergies, medications, problem list, medical history, surgical history, family history, social history, and previous encounter notes.  Time spent on visit including pre-visit chart review and post-visit care and charting was 25 minutes.   I, Water quality scientist, CMA, am acting as transcriptionist for Briscoe Deutscher, DO  I have reviewed the above documentation for accuracy and completeness, and I agree with the above. Briscoe Deutscher, DO

## 2019-11-19 ENCOUNTER — Encounter: Payer: Self-pay | Admitting: Dermatology

## 2019-11-19 ENCOUNTER — Ambulatory Visit: Payer: 59 | Admitting: Dermatology

## 2019-11-19 ENCOUNTER — Other Ambulatory Visit: Payer: Self-pay

## 2019-11-19 DIAGNOSIS — L700 Acne vulgaris: Secondary | ICD-10-CM

## 2019-11-19 DIAGNOSIS — L309 Dermatitis, unspecified: Secondary | ICD-10-CM

## 2019-11-19 DIAGNOSIS — L818 Other specified disorders of pigmentation: Secondary | ICD-10-CM | POA: Diagnosis not present

## 2019-11-19 DIAGNOSIS — L709 Acne, unspecified: Secondary | ICD-10-CM | POA: Diagnosis not present

## 2019-11-19 MED ORDER — CLOBETASOL PROPIONATE 0.05 % EX OINT: TOPICAL_OINTMENT | CUTANEOUS | 3 refills | Status: DC

## 2019-11-19 MED ORDER — DAPSONE 7.5 % EX GEL: CUTANEOUS | 2 refills | Status: DC

## 2019-11-19 NOTE — Progress Notes (Signed)
Follow-Up Visit   Subjective  Veronica Norton is a 44 y.o. female who presents for the following: Acne.  Patient presents presents for the following: Acne (has been taking spironolactone 100mg  QD and  Aczone 7.5, no curernt breakout at this time.) and Eczema (forehead, seems to be gone, using Hydrocortisone cream 2.5%).  Forehead is discolored/dark.  No side effects from spironolactone. Patient used [facility] fade cream on dark areas on face. Patient states that all areas are better today  The following portions of the chart were reviewed this encounter and updated as appropriate:  Tobacco  Allergies  Meds  Problems  Med Hx  Surg Hx  Fam Hx      Review of Systems:  No other skin or systemic complaints except as noted in HPI or Assessment and Plan.  Objective  Well appearing patient in no apparent distress; mood and affect are within normal limits.  A focused examination was performed including face, neck, chest, back, arms. Relevant physical exam findings are noted in the Assessment and Plan.  Objective  Face: Many small inflammatory papules along jaw and cheeks  Objective  Posterior Mid Neck: Scaly erythematous papules and patches at neck  Objective  Right Buccal Cheek : Hyperpigmented macules   Assessment & Plan  Acne vulgaris Face  Continue Spironolactone 100 mg QD  Start Aczone apply pea size amount to entire face QHS  Accutane deferred at this time  Accutane is discussed fully with the patient. It is a very effective drug to treat acne vulgaris but has many significant side effects. Chief among these are teratogenesis, hepatic injury, dyslipidemia and severe drying of the mucous membranes. All of these issues have been discussed in details. Monthly blood tests to monitor lipids and liver functions will be necessary. Expect painful dryness and/or fissuring around the lips, eyes, and other moist areas of the body. Balms may be protective. Contact lens  may be too painful to wear temporarily while on this drug. Episodes of significant depression have been reported, including suicidal ideation and attempts in rare cases. It may also cause pseudotumor cerebri and hyperostosis. The patient will report any such changes in mood, depressive symptoms or suicidal thoughts, headaches, joint or bone pains.  Female patients MUST use two simultaneous methods of family planning. Accutane is Category X for pregnancy, meaning it will cause fetal teratogenic malformations, and pregnancy MUST be avoided while on this druG  After discussion of these important issues, she indicates complete understanding of all of the above,   Eczema, unspecified type Posterior Mid Neck  Chronic, improved but not at goal  Start Clobetasol ointment BID for 2 weeks then weekends only for stubborn areas of eczema on body Avoid face. Groin, and axilla  Continue hydrocortisone 2.5% cream twice daily as needed to face  Topical steroids (such as triamcinolone, fluocinolone, fluocinonide, mometasone, clobetasol, halobetasol, betamethasone, hydrocortisone) can cause thinning and lightening of the skin if they are used for too long in the same area. Your physician has selected the right strength medicine for your problem and area affected on the body. Please use your medication only as directed by your physician to prevent side effects.      clobetasol ointment (TEMOVATE) 0.05 % - Posterior Mid Neck  Post inflammatory hypopigmentation Right Buccal Cheek   Restart [facility] Hydroquinone 12%/kojic acid/vitamin C cream pea sized amount twice daily to the entire face for up to 3 months. This cannot be used more than 3 months due to risk  of exogenous ochronosis (permanent dark spots). The patient was advised this is not covered by insurance. They will receive an email to check out and the medication will be mailed to their home.     Acne, unspecified acne type  Reordered  Medications Dapsone (ACZONE) 7.5 % GEL  Other Related Medications spironolactone (ALDACTONE) 100 MG tablet  Return in about 3 months (around 02/18/2020) for Acne.  IDonzetta Kohut, CMA, am acting as scribe for Forest Gleason, MD .  Documentation: I have reviewed the above documentation for accuracy and completeness, and I agree with the above.  Forest Gleason, MD

## 2019-11-19 NOTE — Patient Instructions (Addendum)
Recommend daily broad spectrum sunscreen SPF 30+ to sun-exposed areas, reapply every 2 hours as needed. Call for new or changing lesions.  Instructions for [facility] Medications  One or more of your medications was sent to the [facility] mail order compounding pharmacy. You will receive an email from them and can purchase the medicine through that link. It will then be mailed to your home at the address you confirmed. If for any reason you do not receive an email from them, please check your spam folder. If you still do not find the email, please let us know or call [facility] at [phone]  Topical steroids (such as triamcinolone, fluocinolone, fluocinonide, mometasone, clobetasol, halobetasol, betamethasone, hydrocortisone) can cause thinning and lightening of the skin if they are used for too long in the same area. Your physician has selected the right strength medicine for your problem and area affected on the body. Please use your medication only as directed by your physician to prevent side effects.   Avoid applying to face, groin, and axilla. Use as directed. Risk of skin atrophy with long-term use reviewed.    Topical retinoid medications like tretinoin/Retin-A, adapalene/Differin, tazarotene/Fabior, and Epiduo/Epiduo Forte can cause dryness and irritation when first started. Only apply a pea-sized amount to the entire affected area. Avoid applying it around the eyes, edges of mouth and creases at the nose. If you experience irritation, use a good moisturizer first and/or apply the medicine less often. If you are doing well with the medicine, you can increase how often you use it until you are applying every night. Be careful with sun protection while using this medication as it can make you sensitive to the sun. This medicine should not be used by pregnant women.

## 2019-11-26 ENCOUNTER — Encounter: Payer: Self-pay | Admitting: Family Medicine

## 2019-11-26 ENCOUNTER — Other Ambulatory Visit: Payer: Self-pay

## 2019-11-26 ENCOUNTER — Ambulatory Visit: Payer: 59 | Admitting: Family Medicine

## 2019-11-26 VITALS — BP 122/74 | HR 98 | Temp 98.4°F | Ht 65.0 in | Wt 284.0 lb

## 2019-11-26 DIAGNOSIS — I10 Essential (primary) hypertension: Secondary | ICD-10-CM | POA: Diagnosis not present

## 2019-11-26 DIAGNOSIS — E1169 Type 2 diabetes mellitus with other specified complication: Secondary | ICD-10-CM

## 2019-11-26 DIAGNOSIS — Z23 Encounter for immunization: Secondary | ICD-10-CM | POA: Diagnosis not present

## 2019-11-26 DIAGNOSIS — E669 Obesity, unspecified: Secondary | ICD-10-CM

## 2019-11-26 NOTE — Assessment & Plan Note (Addendum)
Check A1c.  Continue Trulicity 4.5 mg once weekly and Metformin 1000 mg twice daily.  Discussed the recommendation of taking a statin and everyone who has diabetes.  Her LDL is been extremely well controlled on no medication.  She is hesitant to start on any additional medicine at this time and we will see how her LDL is with recheck today prior to considering additional medication.

## 2019-11-26 NOTE — Progress Notes (Signed)
  Tommi Rumps, MD Phone: [phone]  Veronica Norton is a 44 y.o. female who presents today for f/u.  HYPERTENSION  Disease Monitoring  Home BP Monitoring not checking Chest pain- no    Dyspnea- no Medications  Compliance-  Taking spironolactone, HCTZ.   Edema- no  DIABETES Disease Monitoring: Blood Sugar ranges-not checking Polyuria/phagia/dipsia- no      Optho- UTD Medications: Compliance- taking trulicity, metformin Hypoglycemic symptoms- no Walking 30+ minutes 3-4 days per week.     Social History   Tobacco Use  Smoking Status Never Smoker  Smokeless Tobacco Never Used     ROS see history of present illness  Objective  Physical Exam Vitals:   11/26/19 1419  BP: 124/80  Pulse: 98  Temp: 98.4 F (36.9 C)  SpO2: 98%    BP Readings from Last 3 Encounters:  11/26/19 124/80  11/13/19 119/82  10/15/19 111/81   Wt Readings from Last 3 Encounters:  11/26/19 284 lb (128.8 kg)  11/13/19 282 lb (127.9 kg)  10/15/19 282 lb (127.9 kg)    Physical Exam Constitutional:      General: She is not in acute distress.    Appearance: She is not diaphoretic.  Cardiovascular:     Rate and Rhythm: Normal rate and regular rhythm.     Heart sounds: Normal heart sounds.  Pulmonary:     Effort: Pulmonary effort is normal.     Breath sounds: Normal breath sounds.  Musculoskeletal:     Right lower leg: No edema.     Left lower leg: No edema.  Skin:    General: Skin is warm and dry.  Neurological:     Mental Status: She is alert.      Assessment/Plan: Please see individual problem list.  Hypertension Right around goal BP is less than 130/80.  She is hesitant to take any more medication.  We will have her work on continued exercise and continue to follow-up with medical weight management for weight loss.  Continue spironolactone 100 mg daily and HCTZ 12.5 mg once daily.  Diabetes mellitus type 2 in obese Check A1c.  Continue Trulicity 4.5 mg once weekly  and Metformin 1000 mg twice daily.  Discussed the recommendation of taking a statin and everyone who has diabetes.  Her LDL is been extremely well controlled on no medication.  She is hesitant to start on any additional medicine at this time and we will see how her LDL is with recheck today prior to considering additional medication.   Orders Placed This Encounter  Procedures  . Hemoglobin A1c  . Direct LDL    No orders of the defined types were placed in this encounter.   Leighton was seen today for follow-up.  Diagnoses and all orders for this visit:  Essential hypertension -     Direct LDL  Diabetes mellitus type 2 in obese (HCC) -     Hemoglobin A1c -     Direct LDL     This visit occurred during the SARS-CoV-2 public health emergency.  Safety protocols were in place, including screening questions prior to the visit, additional usage of staff PPE, and extensive cleaning of exam room while observing appropriate contact time as indicated for disinfecting solutions.    Tommi Rumps, MD [place]

## 2019-11-26 NOTE — Assessment & Plan Note (Addendum)
Right around goal BP is less than 130/80.  She is hesitant to take any more medication.  We will have her work on continued exercise and continue to follow-up with medical weight management for weight loss.  Continue spironolactone 100 mg daily and HCTZ 12.5 mg once daily.

## 2019-11-26 NOTE — Patient Instructions (Signed)
Nice to see you. Please continue with walking for exercise. We will get lab work today and contact you with the results.

## 2019-11-27 LAB — LDL CHOLESTEROL, DIRECT: Direct LDL: 73 mg/dL

## 2019-11-27 LAB — HEMOGLOBIN A1C: Hgb A1c MFr Bld: 7.3 % — ABNORMAL HIGH (ref 4.6–6.5)

## 2019-12-02 ENCOUNTER — Encounter: Payer: Self-pay | Admitting: Dermatology

## 2019-12-03 ENCOUNTER — Encounter (INDEPENDENT_AMBULATORY_CARE_PROVIDER_SITE_OTHER): Payer: Self-pay

## 2019-12-04 ENCOUNTER — Other Ambulatory Visit: Payer: Self-pay

## 2019-12-04 ENCOUNTER — Telehealth: Payer: Self-pay

## 2019-12-04 ENCOUNTER — Ambulatory Visit (INDEPENDENT_AMBULATORY_CARE_PROVIDER_SITE_OTHER): Payer: 59 | Admitting: Family Medicine

## 2019-12-04 ENCOUNTER — Encounter (INDEPENDENT_AMBULATORY_CARE_PROVIDER_SITE_OTHER): Payer: Self-pay | Admitting: Family Medicine

## 2019-12-04 VITALS — BP 121/84 | HR 28 | Temp 98.0°F | Ht 65.0 in | Wt 281.0 lb

## 2019-12-04 DIAGNOSIS — Z6841 Body Mass Index (BMI) 40.0 and over, adult: Secondary | ICD-10-CM | POA: Diagnosis not present

## 2019-12-04 DIAGNOSIS — E1169 Type 2 diabetes mellitus with other specified complication: Secondary | ICD-10-CM | POA: Diagnosis not present

## 2019-12-04 NOTE — Telephone Encounter (Signed)
-----   Message from Leone Haven, MD sent at 11/27/2019 12:21 PM EDT ----- Please let the patient know that her A1c is 7.3.  Its not quite at goal of less than 7.3.  I would like to add an additional medicine called Jardiance to her regimen to see if that would help lower her A1c to goal.  Her LDL is slightly above goal.  I would like her to consider a statin for this.  If she is okay with a statin she would need to remain on birth control and if she were ever thinking about getting pregnant then we would probably need to discontinue the statin at that time as there is risk to the fetus with being on a statin.

## 2019-12-09 ENCOUNTER — Other Ambulatory Visit: Payer: Self-pay | Admitting: Family Medicine

## 2019-12-09 DIAGNOSIS — E1169 Type 2 diabetes mellitus with other specified complication: Secondary | ICD-10-CM

## 2019-12-09 MED ORDER — EMPAGLIFLOZIN 10 MG PO TABS: 10.0000 mg | ORAL_TABLET | Freq: Every day | ORAL | 2 refills | Status: DC

## 2019-12-09 MED ORDER — ROSUVASTATIN CALCIUM 20 MG PO TABS: 20.0000 mg | ORAL_TABLET | Freq: Every day | ORAL | 3 refills | Status: DC

## 2019-12-09 NOTE — Progress Notes (Signed)
Chief Complaint:   OBESITY Veronica Norton is here to discuss her progress with her obesity treatment plan along with follow-up of her obesity related diagnoses. Veronica Norton is on the Category 1 Plan and states she is following her eating plan approximately 75+% of the time. Veronica Norton states she is exercising for 0 minutes 0 times per week.  Today's visit was #: 73 Starting weight: 284 lbs Starting date: 02/03/2019 Today's weight: 281 lbs Today's date: 12/04/2019 Total lbs lost to date: 3 lbs Total lbs lost since last in-office visit: 1 lb Total weight loss percentage to date: -1.06%  Interim History: Veronica Norton says she is still overwhelmed with work and school.  Her relationship is going well.  She says she is not focused on food or exercise.  She is still seeing Dr. Gwenlyn Saran, Psychology.  Assessment/Plan:   1. Type 2 diabetes mellitus with other specified complication, without long-term current use of insulin (HCC) Stable at 7.3.  Continue Trulicity.  I agree with PCP recommendation of adding SGLT2.  Veronica Norton refuses to add medication at this time.  We had a long conversation regarding medications versus lifestyle changes.  She must focus on food choices and keep exercising to decrease A1c.  Will start with journaling.  2. Class 3 severe obesity with serious comorbidity and body mass index (BMI) of 45.0 to 49.9 in adult, unspecified obesity type (HCC)  Veronica Norton is currently in the action stage of change. As such, her goal is to continue with weight loss efforts. She has agreed to practicing portion control and making smarter food choices, such as increasing vegetables and decreasing simple carbohydrates.  Journal all food to focus on mindfulness.   Exercise goals: For substantial health benefits, adults should do at least 150 minutes (2 hours and 30 minutes) a week of moderate-intensity, or 75 minutes (1 hour and 15 minutes) a week of vigorous-intensity aerobic physical activity, or an equivalent combination of  moderate- and vigorous-intensity aerobic activity. Aerobic activity should be performed in episodes of at least 10 minutes, and preferably, it should be spread throughout the week.  Behavioral modification strategies: increasing lean protein intake, increasing water intake, emotional eating strategies and keeping a strict food journal.  Veronica Norton has agreed to follow-up with our clinic in 2-3 weeks. She was informed of the importance of frequent follow-up visits to maximize her success with intensive lifestyle modifications for her multiple health conditions.   Objective:   Blood pressure 121/84, pulse (!) 28, temperature 98 F (36.7 C), temperature source Oral, height 5\' 5"  (1.651 m), weight 281 lb (127.5 kg), SpO2 98 %. Body mass index is 46.76 kg/m.  General: Cooperative, alert, well developed, in no acute distress. HEENT: Conjunctivae and lids unremarkable. Cardiovascular: Regular rhythm.  Lungs: Normal work of breathing. Neurologic: No focal deficits.   Lab Results  Component Value Date   CREATININE 0.73 08/18/2019   BUN 13 08/18/2019   NA 137 08/18/2019   K 4.3 08/18/2019   CL 97 08/18/2019   CO2 24 08/18/2019   Lab Results  Component Value Date   ALT 18 08/18/2019   AST 14 08/18/2019   ALKPHOS 98 08/18/2019   BILITOT 0.3 08/18/2019   Lab Results  Component Value Date   HGBA1C 7.3 (H) 11/26/2019   HGBA1C 7.3 (H) 08/18/2019   HGBA1C 7.3 (H) 05/05/2019   HGBA1C 10.9 (H) 01/15/2019   HGBA1C 7.8 (A) 05/22/2018   Lab Results  Component Value Date   TSH 1.49 09/24/2019   Lab Results  Component Value Date   CHOL 118 05/05/2019   HDL 48 05/05/2019   LDLCALC 56 05/05/2019   LDLDIRECT 73.0 11/26/2019   TRIG 65 05/05/2019   CHOLHDL 2.5 05/05/2019   Lab Results  Component Value Date   WBC 9.3 08/18/2019   HGB 15.2 08/18/2019   HCT 44.2 08/18/2019   MCV 96 08/18/2019   PLT 425 08/18/2019   Attestation Statements:   Reviewed by clinician on day of visit:  allergies, medications, problem list, medical history, surgical history, family history, social history, and previous encounter notes.  Time spent on visit including pre-visit chart review and post-visit care and charting was 30 minutes.   I, Water quality scientist, CMA, am acting as transcriptionist for Briscoe Deutscher, DO  I have reviewed the above documentation for accuracy and completeness, and I agree with the above. Briscoe Deutscher, DO

## 2019-12-10 ENCOUNTER — Telehealth: Payer: Self-pay

## 2019-12-10 ENCOUNTER — Ambulatory Visit (INDEPENDENT_AMBULATORY_CARE_PROVIDER_SITE_OTHER): Payer: 59 | Admitting: Family Medicine

## 2019-12-10 NOTE — Telephone Encounter (Signed)
-----   Message from Leone Haven, MD sent at 12/09/2019  2:06 PM EDT ----- Medication sent to pharmacy.  She needs repeat labs in 6 weeks.  Orders placed.  Please get her scheduled.  Thanks.

## 2019-12-22 ENCOUNTER — Ambulatory Visit: Payer: 59 | Admitting: Pharmacist

## 2019-12-22 DIAGNOSIS — E1169 Type 2 diabetes mellitus with other specified complication: Secondary | ICD-10-CM

## 2019-12-22 DIAGNOSIS — F419 Anxiety disorder, unspecified: Secondary | ICD-10-CM

## 2019-12-22 DIAGNOSIS — I10 Essential (primary) hypertension: Secondary | ICD-10-CM

## 2019-12-22 DIAGNOSIS — F32A Depression, unspecified: Secondary | ICD-10-CM

## 2019-12-22 NOTE — Chronic Care Management (AMB) (Signed)
Chronic Care Management   Follow Up Note   12/22/2019 Name: Veronica Norton MRN: [id number] DOB: 1975/07/01  Referred by: Leone Haven, MD Reason for referral : Chronic Care Management (Medication Management)   Veronica Norton is a 44 y.o. year old female who is a primary care patient of Caryl Bis, Angela Adam, MD. The CCM team was consulted for assistance with chronic disease management and care coordination needs.    Contacted patient for medication management review.  Review of patient status, including review of consultants reports, relevant laboratory and other test results, and collaboration with appropriate care team members and the patient's provider was performed as part of comprehensive patient evaluation and provision of chronic care management services.    SDOH (Social Determinants of Health) assessments performed: No See Care Plan activities for detailed interventions related to [facility])     Outpatient Encounter Medications as of 12/22/2019  Medication Sig   APPLE CIDER VINEGAR PO Take by mouth. 2-3 gummies 2 times per day   clobetasol ointment (TEMOVATE) 0.05 % Apply to stubborn areas of Eczema  For 2 weeks, then only on weekends. Avoid Face, groin, and under arms   Dapsone (ACZONE) 7.5 % GEL Apply small amount once daily to affected areas   Dulaglutide (TRULICITY) 4.5 GL/8.7FI SOPN Inject 0.5 mLs (4.5 mg total) as directed once a week.   empagliflozin (JARDIANCE) 10 MG TABS tablet Take 1 tablet (10 mg total) by mouth daily before breakfast.   glucose blood (FREESTYLE TEST STRIPS) test strip Check sugar bid   glucose monitoring kit (FREESTYLE) monitoring kit Use as directed to check sugars bid   hydrochlorothiazide (MICROZIDE) 12.5 MG capsule TAKE 1 CAPSULE BY MOUTH EVERY DAY   Lancets (ONETOUCH ULTRASOFT) lancets Use as instructed   metFORMIN (GLUCOPHAGE-XR) 500 MG 24 hr tablet Take 2 tablets (1,000 mg total) by mouth 2 (two) times daily.    norethindrone (CAMILA) 0.35 MG tablet Take 1 tablet (0.35 mg total) by mouth daily.   rosuvastatin (CRESTOR) 20 MG tablet Take 1 tablet (20 mg total) by mouth daily.   spironolactone (ALDACTONE) 100 MG tablet TAKE 1 TABLET BY MOUTH EVERY DAY   Vitamin D, Ergocalciferol, (DRISDOL) 1.25 MG (50000 UNIT) CAPS capsule Take 1 capsule (50,000 Units total) by mouth every 7 (seven) days.   fluticasone (CUTIVATE) 0.05 % cream APPLY TO AFFECTED AREA UP TO TWICE A DAY AS NEEDED FOR RASH/ITCHING (Patient not taking: Reported on 12/22/2019)   No facility-administered encounter medications on file as of 12/22/2019.     Objective:   Goals Addressed              This Visit's Progress     Patient Stated     "I want to get my sugars better under control" (pt-stated)        CARE PLAN ENTRY (see longtitudinal plan of care for additional care plan information)  Current Barriers:   Social, financial, and community barriers: o Notes a recent trip with her boyfriend where she had a "mental breakdown" in regards to multiple stressors in her life. Feels much better after that. Does not feel as much "weight" on her  Diabetes: uncontrolled; most recent 4.3%, complicated by HTN, obesity  Current antihyperglycemic regimen: metformin 3295 mg BID, Trulicity 4.5 mg weekly, Jardiance 10 mg - just added by PCP. Notes one episode of vaginal itching, but resolved on its own.  Current glucose readings: not checking right now.   Current exercise:  o Goal to walk  for 30 minutes 3 or 4 times weekly  Cardiovascular risk reduction o Current hypertensive regimen: HCTZ 12.5 mg daily, spironolactone 100 mg QAM; last clinic BP at goal o Current hyperlipidemia regimen: rosuvastatin 10 mg daily; upcoming lab work s/p 6 weeks of therapy   Supplements: Vit D weekly. Notes that she has not been taking prescription because she has been in the sun lately, but plans to restart now that weather is changing.   Pharmacist  Clinical Goal(s):   Over the next 90 days, patient will work with PharmD and primary care provider to address optimized glycemic management  Interventions:  Comprehensive medication review performed, medication list updated in electronic medical record  Inter-disciplinary care team collaboration (see longitudinal plan of care)  Reviewed mechanism of SGLT2, importance of remaining well hydrated. Patient verbalized understanding.   Reviewed goal A1c, goal fasting, and goal 2 hour post prandial. Encouraged to start periodically checking blood sugars to evaluate control. Likely addition of Jardiance will help move closer to goal A1c <7%, and seeing goal glucose readings may provide motivation for patient  Reviewed indication for statin therapy, even though LDL fairly well controlled w/o therapy. Reviewed goal LDL <70 for patient's w/ T2DM w/ HTN. Reviewed that she needs to contact PCP if she was to become pregnant while on this medication. She verbalized understanding.   Encouraged continued collaboration w/ psychiatrist.   Patient Self Care Activities:   Patient will check glucose periodically, document, and provide at future appointments  Patient will take medications as prescribed  Patient will report any questions or concerns to provider   Please see past updates related to this goal by clicking on the "Past Updates" button in the selected goal          Plan:  - Scheduled f/u call in ~ 6 weeks  Catie Darnelle Maffucci, PharmD, [place], [place] Pharmacist [facility] [phone]

## 2019-12-22 NOTE — Patient Instructions (Signed)
Visit Information  Goals Addressed              This Visit's Progress     Patient Stated   .  "I want to get my sugars better under control" (pt-stated)        CARE PLAN ENTRY (see longtitudinal plan of care for additional care plan information)  Current Barriers:  . Social, financial, and community barriers: o Notes a recent trip with her boyfriend where she had a "mental breakdown" in regards to multiple stressors in her life. Feels much better after that. Does not feel as much "weight" on her . Diabetes: uncontrolled; most recent 4.9%, complicated by HTN, obesity . Current antihyperglycemic regimen: metformin 6759 mg BID, Trulicity 4.5 mg weekly, Jardiance 10 mg - just added by PCP. Notes one episode of vaginal itching, but resolved on its own. . Current glucose readings: not checking right now.  . Current exercise:  o Goal to walk for 30 minutes 3 or 4 times weekly . Cardiovascular risk reduction o Current hypertensive regimen: HCTZ 12.5 mg daily, spironolactone 100 mg QAM; last clinic BP at goal o Current hyperlipidemia regimen: rosuvastatin 10 mg daily; upcoming lab work s/p 6 weeks of therapy  . Supplements: Vit D weekly. Notes that she has not been taking prescription because she has been in the sun lately, but plans to restart now that weather is changing.   Pharmacist Clinical Goal(s):  Marland Kitchen Over the next 90 days, patient will work with PharmD and primary care provider to address optimized glycemic management  Interventions: . Comprehensive medication review performed, medication list updated in electronic medical record . Inter-disciplinary care team collaboration (see longitudinal plan of care) . Reviewed mechanism of SGLT2, importance of remaining well hydrated. Patient verbalized understanding.  . Reviewed goal A1c, goal fasting, and goal 2 hour post prandial. Encouraged to start periodically checking blood sugars to evaluate control. Likely addition of Jardiance will  help move closer to goal A1c <7%, and seeing goal glucose readings may provide motivation for patient . Reviewed indication for statin therapy, even though LDL fairly well controlled w/o therapy. Reviewed goal LDL <70 for patient's w/ T2DM w/ HTN. Reviewed that she needs to contact PCP if she was to become pregnant while on this medication. She verbalized understanding.  . Encouraged continued collaboration w/ psychiatrist.   Patient Self Care Activities:  . Patient will check glucose periodically, document, and provide at future appointments . Patient will take medications as prescribed . Patient will report any questions or concerns to provider   Please see past updates related to this goal by clicking on the "Past Updates" button in the selected goal         The patient verbalized understanding of instructions provided today and declined a print copy of patient instruction materials.   Plan:  - Scheduled f/u call in ~ 6 weeks  Catie Darnelle Maffucci, PharmD, [place], [place] Pharmacist [place] [phone]

## 2019-12-23 ENCOUNTER — Other Ambulatory Visit (INDEPENDENT_AMBULATORY_CARE_PROVIDER_SITE_OTHER): Payer: Self-pay | Admitting: Family Medicine

## 2019-12-23 ENCOUNTER — Encounter (INDEPENDENT_AMBULATORY_CARE_PROVIDER_SITE_OTHER): Payer: Self-pay

## 2019-12-23 DIAGNOSIS — E119 Type 2 diabetes mellitus without complications: Secondary | ICD-10-CM

## 2019-12-23 NOTE — Telephone Encounter (Signed)
Message sent to pt.

## 2019-12-31 ENCOUNTER — Ambulatory Visit (INDEPENDENT_AMBULATORY_CARE_PROVIDER_SITE_OTHER): Payer: 59 | Admitting: Family Medicine

## 2019-12-31 NOTE — Progress Notes (Deleted)
     Chief Complaint:   OBESITY Veronica Norton is here to discuss her progress with her obesity treatment plan along with follow-up of her obesity related diagnoses.   Today's visit was #: *** Starting weight: *** Starting date: *** Today's weight: *** Today's date: 12/31/2019 Total lbs lost to date: *** There is no height or weight on file to calculate BMI.  Total weight loss percentage to date: ***  Interim History: ***  Nutrition Plan: ***. Anti-obesity medications: ***. Reported side effects: ***. Hunger is {EWCONTROLASSESSMENT:24261} controlled. Cravings are {EWCONTROLASSESSMENT:24261} controlled.  Activity: *** Sleep: Number of hours slept each night: ***. Sleep {ACTION; IS/IS NOT:21021397} restful.  Stress: ***.  Assessment/Plan:   ***  Veronica Norton is currently in the action stage of change. As such, her goal is to continue with weight loss efforts.   Nutrition goals: She has agreed to {MWMwtlossportion/plan2:23431}.   Exercise goals: {MWM EXERCISE RECS:23473}  Behavioral modification strategies: {MWMwtlossdietstrategies3:23432}.  Ha has agreed to follow-up with our clinic in {NUMBER 1-10:22536} weeks. She was informed of the importance of frequent follow-up visits to maximize her success with intensive lifestyle modifications for her multiple health conditions.   ***delete paragraph if no labs orderedShana was informed we would discuss her lab results at her next visit unless there is a critical issue that needs to be addressed sooner. Veronica Norton agreed to keep her next visit at the agreed upon time to discuss these results.  Objective:   There were no vitals taken for this visit. There is no height or weight on file to calculate BMI.  General: Cooperative, alert, well developed, in no acute distress. HEENT: Conjunctivae and lids unremarkable. Cardiovascular: Regular rhythm.  Lungs: Normal work of breathing. Neurologic: No focal deficits.   Lab Results  Component Value  Date   CREATININE 0.73 08/18/2019   BUN 13 08/18/2019   NA 137 08/18/2019   K 4.3 08/18/2019   CL 97 08/18/2019   CO2 24 08/18/2019   Lab Results  Component Value Date   ALT 18 08/18/2019   AST 14 08/18/2019   ALKPHOS 98 08/18/2019   BILITOT 0.3 08/18/2019   Lab Results  Component Value Date   HGBA1C 7.3 (H) 11/26/2019   HGBA1C 7.3 (H) 08/18/2019   HGBA1C 7.3 (H) 05/05/2019   HGBA1C 10.9 (H) 01/15/2019   HGBA1C 7.8 (A) 05/22/2018   No results found for: INSULIN Lab Results  Component Value Date   TSH 1.49 09/24/2019   Lab Results  Component Value Date   CHOL 118 05/05/2019   HDL 48 05/05/2019   LDLCALC 56 05/05/2019   LDLDIRECT 73.0 11/26/2019   TRIG 65 05/05/2019   CHOLHDL 2.5 05/05/2019   Lab Results  Component Value Date   WBC 9.3 08/18/2019   HGB 15.2 08/18/2019   HCT 44.2 08/18/2019   MCV 96 08/18/2019   PLT 425 08/18/2019   Attestation Statements:   Reviewed by clinician on day of visit: allergies, medications, problem list, medical history, surgical history, family history, social history, and previous encounter notes.  ***(delete if time-based billing not used)Time spent on visit including pre-visit chart review and post-visit care and charting was *** minutes.   I, ***, am acting as transcriptionist for ***.  I have reviewed the above documentation for accuracy and completeness, and I agree with the above. -  ***

## 2020-01-01 ENCOUNTER — Other Ambulatory Visit: Payer: Self-pay

## 2020-01-01 ENCOUNTER — Ambulatory Visit (INDEPENDENT_AMBULATORY_CARE_PROVIDER_SITE_OTHER): Payer: 59 | Admitting: Family Medicine

## 2020-01-01 ENCOUNTER — Encounter (INDEPENDENT_AMBULATORY_CARE_PROVIDER_SITE_OTHER): Payer: Self-pay | Admitting: Family Medicine

## 2020-01-01 VITALS — BP 118/76 | HR 96 | Temp 98.5°F | Ht 65.0 in | Wt 276.0 lb

## 2020-01-01 DIAGNOSIS — I152 Hypertension secondary to endocrine disorders: Secondary | ICD-10-CM

## 2020-01-01 DIAGNOSIS — E559 Vitamin D deficiency, unspecified: Secondary | ICD-10-CM

## 2020-01-01 DIAGNOSIS — E785 Hyperlipidemia, unspecified: Secondary | ICD-10-CM

## 2020-01-01 DIAGNOSIS — E1169 Type 2 diabetes mellitus with other specified complication: Secondary | ICD-10-CM | POA: Diagnosis not present

## 2020-01-01 DIAGNOSIS — E1159 Type 2 diabetes mellitus with other circulatory complications: Secondary | ICD-10-CM | POA: Diagnosis not present

## 2020-01-01 DIAGNOSIS — L680 Hirsutism: Secondary | ICD-10-CM

## 2020-01-01 DIAGNOSIS — Z6841 Body Mass Index (BMI) 40.0 and over, adult: Secondary | ICD-10-CM

## 2020-01-06 ENCOUNTER — Other Ambulatory Visit: Payer: Self-pay | Admitting: Family Medicine

## 2020-01-07 NOTE — Progress Notes (Signed)
Chief Complaint:   OBESITY Veronica Norton is here to discuss her progress with her obesity treatment plan along with follow-up of her obesity related diagnoses.   Today's visit was #: 18 Starting weight: 284 lbs Starting date: 02/03/2019 Today's weight: 276 lbs Today's date: 01/01/2020 Total lbs lost to date: 8 lbs Body mass index is 45.93 kg/m.  Total weight loss percentage to date: -2.82%  Interim History: [name] says she has a boyfriend and is happy in the relationship.  She also got a promotion at work.  She reports that she started Jardiance and Crestor and is tolerating them well.  She had 1 episode of a yeast infection.  She says she feels dry (thirsty).  Nutrition Plan:  Practicing portion control and making smarter food choices, such as increasing vegetables and decreasing simple carbohydrates for 65% of the time. Anti-obesity medications: Trulicity 0.5 mg weekly. Reported side effects: None. Hunger is well controlled controlled. Cravings are moderately controlled controlled.  Activity: Walking for 60 minutes 1-2 times per week.  Assessment/Plan:   1. Type 2 diabetes mellitus with other specified complication, without long-term current use of insulin (HCC) Diabetes Mellitus: stable. Medication: Jardiance 10 mg daily, metformin 1000 mg XR twice daily, Trulicity 0.5 mg subcu weekly. Issues reviewed with her: blood sugar goals, complications of diabetes mellitus, hypoglycemia prevention and treatment, exercise, nutrition, and carbohydrate counting.   Lab Results  Component Value Date   HGBA1C 7.3 (H) 11/26/2019   HGBA1C 7.3 (H) 08/18/2019   HGBA1C 7.3 (H) 05/05/2019   Lab Results  Component Value Date   MICROALBUR 0.8 01/15/2019   LDLCALC 56 05/05/2019   CREATININE 0.73 08/18/2019   2. Hypertension associated with diabetes ([place]) At goal. Medications: HCTZ 12.5 mg daily. We will monitor for hypotension with continued weight loss.   BP Readings from Last 3 Encounters:    01/01/20 118/76  12/04/19 121/84  11/26/19 122/74   Lab Results  Component Value Date   CREATININE 0.73 08/18/2019   3. Hirsutism Jenisha is taking spironolactone 100 mg daily.  Likely has PCOS. She will continue to focus on protein-rich, low simple carbohydrate foods. We reviewed the importance of hydration, regular exercise for stress reduction, and restorative sleep.   4. Class 3 severe obesity with serious comorbidity and body mass index (BMI) of 45.0 to 49.9 in adult, unspecified obesity type [facility])  Discussed bariatric surgery as an option.  She hopes for pregnancy in the next few years.  Will refer to [facility] for bariatric surgery evaluation.  - Ambulatory referral to [place] is currently in the action stage of change. As such, her goal is to continue with weight loss efforts.   Nutrition goals: She has agreed to practicing portion control and making smarter food choices, such as increasing vegetables and decreasing simple carbohydrates.   Exercise goals: For substantial health benefits, adults should do at least 150 minutes (2 hours and 30 minutes) a week of moderate-intensity, or 75 minutes (1 hour and 15 minutes) a week of vigorous-intensity aerobic physical activity, or an equivalent combination of moderate- and vigorous-intensity aerobic activity. Aerobic activity should be performed in episodes of at least 10 minutes, and preferably, it should be spread throughout the week.  Behavioral modification strategies: increasing lean protein intake, decreasing simple carbohydrates, increasing vegetables and increasing water intake.  [name] has agreed to follow-up with our clinic in 3 weeks. She was informed of the importance of frequent follow-up visits to maximize her success  with intensive lifestyle modifications for her multiple health conditions.   Objective:   Blood pressure 118/76, pulse 96, temperature 98.5 F (36.9 C), height 5\' 5"  (1.651 m),  weight 276 lb (125.2 kg), SpO2 97 %. Body mass index is 45.93 kg/m.  General: Cooperative, alert, well developed, in no acute distress. HEENT: Conjunctivae and lids unremarkable. Cardiovascular: Regular rhythm.  Lungs: Normal work of breathing. Neurologic: No focal deficits.   Lab Results  Component Value Date   CREATININE 0.73 08/18/2019   BUN 13 08/18/2019   NA 137 08/18/2019   K 4.3 08/18/2019   CL 97 08/18/2019   CO2 24 08/18/2019   Lab Results  Component Value Date   ALT 18 08/18/2019   AST 14 08/18/2019   ALKPHOS 98 08/18/2019   BILITOT 0.3 08/18/2019   Lab Results  Component Value Date   HGBA1C 7.3 (H) 11/26/2019   HGBA1C 7.3 (H) 08/18/2019   HGBA1C 7.3 (H) 05/05/2019   HGBA1C 10.9 (H) 01/15/2019   HGBA1C 7.8 (A) 05/22/2018   Lab Results  Component Value Date   TSH 1.49 09/24/2019   Lab Results  Component Value Date   CHOL 118 05/05/2019   HDL 48 05/05/2019   LDLCALC 56 05/05/2019   LDLDIRECT 73.0 11/26/2019   TRIG 65 05/05/2019   CHOLHDL 2.5 05/05/2019   Lab Results  Component Value Date   WBC 9.3 08/18/2019   HGB 15.2 08/18/2019   HCT 44.2 08/18/2019   MCV 96 08/18/2019   PLT 425 08/18/2019   Attestation Statements:   Reviewed by clinician on day of visit: allergies, medications, problem list, medical history, surgical history, family history, social history, and previous encounter notes.  I, Water quality scientist, CMA, am acting as transcriptionist for Briscoe Deutscher, DO  I have reviewed the above documentation for accuracy and completeness, and I agree with the above. Briscoe Deutscher, DO

## 2020-01-08 ENCOUNTER — Encounter (INDEPENDENT_AMBULATORY_CARE_PROVIDER_SITE_OTHER): Payer: Self-pay

## 2020-01-08 ENCOUNTER — Telehealth (INDEPENDENT_AMBULATORY_CARE_PROVIDER_SITE_OTHER): Payer: Self-pay | Admitting: Family Medicine

## 2020-01-08 MED ORDER — TRULICITY 4.5 MG/0.5ML ~~LOC~~ SOAJ: 4.5000 mg | SUBCUTANEOUS | 0 refills | Status: DC

## 2020-01-08 NOTE — Telephone Encounter (Signed)
Dr Wallace pt °

## 2020-01-08 NOTE — Telephone Encounter (Signed)
Requested a refill for Trulicity at last visit and hasn't received a refill.  She hasn't taken the medication in over a week.  Please give the patient a call

## 2020-01-08 NOTE — Telephone Encounter (Signed)
Pt notified RX sent

## 2020-01-21 ENCOUNTER — Other Ambulatory Visit (INDEPENDENT_AMBULATORY_CARE_PROVIDER_SITE_OTHER): Payer: 59

## 2020-01-21 ENCOUNTER — Other Ambulatory Visit: Payer: Self-pay

## 2020-01-21 DIAGNOSIS — E669 Obesity, unspecified: Secondary | ICD-10-CM

## 2020-01-21 DIAGNOSIS — E1169 Type 2 diabetes mellitus with other specified complication: Secondary | ICD-10-CM | POA: Diagnosis not present

## 2020-01-21 LAB — COMPREHENSIVE METABOLIC PANEL
ALT: 17 U/L (ref 0–35)
AST: 15 U/L (ref 0–37)
Albumin: 3.9 g/dL (ref 3.5–5.2)
Alkaline Phosphatase: 73 U/L (ref 39–117)
BUN: 14 mg/dL (ref 6–23)
CO2: 28 mEq/L (ref 19–32)
Calcium: 9 mg/dL (ref 8.4–10.5)
Chloride: 101 mEq/L (ref 96–112)
Creatinine, Ser: 0.86 mg/dL (ref 0.40–1.20)
GFR: 82.59 mL/min (ref >60.00)
Glucose, Bld: 148 mg/dL — ABNORMAL HIGH (ref 70–99)
Potassium: 4.4 mEq/L (ref 3.5–5.1)
Sodium: 135 mEq/L (ref 135–145)
Total Bilirubin: 0.3 mg/dL (ref 0.2–1.2)
Total Protein: 7 g/dL (ref 6.0–8.3)

## 2020-01-21 LAB — LDL CHOLESTEROL, DIRECT: Direct LDL: 23 mg/dL

## 2020-01-23 DIAGNOSIS — L680 Hirsutism: Secondary | ICD-10-CM | POA: Insufficient documentation

## 2020-01-23 DIAGNOSIS — E1169 Type 2 diabetes mellitus with other specified complication: Secondary | ICD-10-CM | POA: Insufficient documentation

## 2020-01-23 DIAGNOSIS — E785 Hyperlipidemia, unspecified: Secondary | ICD-10-CM | POA: Insufficient documentation

## 2020-01-26 ENCOUNTER — Encounter (INDEPENDENT_AMBULATORY_CARE_PROVIDER_SITE_OTHER): Payer: Self-pay | Admitting: Family Medicine

## 2020-01-26 ENCOUNTER — Ambulatory Visit (INDEPENDENT_AMBULATORY_CARE_PROVIDER_SITE_OTHER): Payer: 59 | Admitting: Family Medicine

## 2020-01-26 ENCOUNTER — Other Ambulatory Visit: Payer: Self-pay

## 2020-01-26 VITALS — BP 121/82 | HR 89 | Temp 98.7°F | Ht 65.0 in | Wt 276.0 lb

## 2020-01-26 DIAGNOSIS — Z6841 Body Mass Index (BMI) 40.0 and over, adult: Secondary | ICD-10-CM

## 2020-01-26 DIAGNOSIS — E119 Type 2 diabetes mellitus without complications: Secondary | ICD-10-CM | POA: Diagnosis not present

## 2020-01-26 DIAGNOSIS — I10 Essential (primary) hypertension: Secondary | ICD-10-CM

## 2020-01-26 DIAGNOSIS — E1169 Type 2 diabetes mellitus with other specified complication: Secondary | ICD-10-CM | POA: Diagnosis not present

## 2020-01-26 DIAGNOSIS — E785 Hyperlipidemia, unspecified: Secondary | ICD-10-CM

## 2020-01-27 MED ORDER — TRULICITY 4.5 MG/0.5ML ~~LOC~~ SOAJ: 4.5000 mg | SUBCUTANEOUS | 0 refills | Status: DC

## 2020-01-29 NOTE — Progress Notes (Signed)
Chief Complaint:   OBESITY Veronica Norton is here to discuss her progress with her obesity treatment plan along with follow-up of her obesity related diagnoses.   Today's visit was #: 59 Starting weight: 284 lbs Starting date: 02/03/2019 Today's weight: 276 lbs Today's date: 01/26/2020 Total lbs lost to date: 8 lbs Body mass index is 45.93 kg/m.  Total weight loss percentage to date: -2.82%  Nutrition Plan: the Category 1 Plan for 65% of the time.  Anti-obesity medications: Trulicity and metformin. Reported side effects: None. Hunger is well controlled controlled. Cravings are well controlled controlled.  Activity: Walking for 20 minutes 1 time per week. Sleep: Sleep is restful.   Assessment/Plan:   1. Type 2 diabetes mellitus without complication, without long-term current use of insulin (HCC) Diabetes Mellitus: needs improvement. Medication: Trulicity, Jardiance, and metformin. Issues reviewed with her: blood sugar goals, complications of diabetes mellitus, hypoglycemia prevention and treatment, exercise, and nutrition.  Lab Results  Component Value Date   HGBA1C 7.3 (H) 11/26/2019   HGBA1C 7.3 (H) 08/18/2019   HGBA1C 7.3 (H) 05/05/2019   Lab Results  Component Value Date   MICROALBUR 0.8 01/15/2019   LDLCALC 56 05/05/2019   CREATININE 0.86 01/21/2020   - Refill Dulaglutide (TRULICITY) 4.5 ZJ/6.9CV SOPN; Inject 4.5 mg as directed once a week.  Dispense: 6 mL; Refill: 0  2. Essential hypertension with diabetes metllitus At goal. Medications: HCTZ.   Plan: Avoid buying foods that are: processed, frozen, or prepackaged to avoid excess salt. We will continue to monitor symptoms as they relate to her weight loss journey.  BP Readings from Last 3 Encounters:  01/26/20 121/82  01/01/20 118/76  12/04/19 121/84   Lab Results  Component Value Date   CREATININE 0.86 01/21/2020   3. Hyperlipidemia associated with type 2 diabetes mellitus ([place]) Course: Stable.  Lipid-lowering medications: Crestor 20 mg daily. Plan: Dietary changes: Increase soluble fiber. Decrease simple carbohydrates. Exercise changes: An average 40 minutes of moderate to vigorous-intensity aerobic activity 3 or 4 times per week.   Lab Results  Component Value Date   CHOL 118 05/05/2019   HDL 48 05/05/2019   LDLCALC 56 05/05/2019   LDLDIRECT 23.0 01/21/2020   TRIG 65 05/05/2019   CHOLHDL 2.5 05/05/2019   Lab Results  Component Value Date   ALT 17 01/21/2020   AST 15 01/21/2020   ALKPHOS 73 01/21/2020   BILITOT 0.3 01/21/2020   4. Class 3 severe obesity with serious comorbidity and body mass index (BMI) of 45.0 to 49.9 in adult, unspecified obesity type [facility])  Course: Veronica Norton is currently in the action stage of change. As such, her goal is to continue with weight loss efforts.   Nutrition goals: She has agreed to practicing portion control and making smarter food choices, such as increasing vegetables and decreasing simple carbohydrates.  Log everything in MFP.   Exercise goals: For substantial health benefits, adults should do at least 150 minutes (2 hours and 30 minutes) a week of moderate-intensity, or 75 minutes (1 hour and 15 minutes) a week of vigorous-intensity aerobic physical activity, or an equivalent combination of moderate- and vigorous-intensity aerobic activity. Aerobic activity should be performed in episodes of at least 10 minutes, and preferably, it should be spread throughout the week.  Behavioral modification strategies: increasing lean protein intake, decreasing simple carbohydrates, increasing vegetables and increasing water intake.  Veronica Norton has agreed to follow-up with our clinic in 3-4 weeks. She was informed of the importance of frequent follow-up  visits to maximize her success with intensive lifestyle modifications for her multiple health conditions.   Objective:   Blood pressure 121/82, pulse 89, temperature 98.7 F (37.1 C), height 5\' 5"  (1.651 m),  weight 276 lb (125.2 kg), SpO2 97 %. Body mass index is 45.93 kg/m.  General: Cooperative, alert, well developed, in no acute distress. HEENT: Conjunctivae and lids unremarkable. Cardiovascular: Regular rhythm.  Lungs: Normal work of breathing. Neurologic: No focal deficits.   Lab Results  Component Value Date   CREATININE 0.86 01/21/2020   BUN 14 01/21/2020   NA 135 01/21/2020   K 4.4 01/21/2020   CL 101 01/21/2020   CO2 28 01/21/2020   Lab Results  Component Value Date   ALT 17 01/21/2020   AST 15 01/21/2020   ALKPHOS 73 01/21/2020   BILITOT 0.3 01/21/2020   Lab Results  Component Value Date   HGBA1C 7.3 (H) 11/26/2019   HGBA1C 7.3 (H) 08/18/2019   HGBA1C 7.3 (H) 05/05/2019   HGBA1C 10.9 (H) 01/15/2019   HGBA1C 7.8 (A) 05/22/2018   Lab Results  Component Value Date   TSH 1.49 09/24/2019   Lab Results  Component Value Date   CHOL 118 05/05/2019   HDL 48 05/05/2019   LDLCALC 56 05/05/2019   LDLDIRECT 23.0 01/21/2020   TRIG 65 05/05/2019   CHOLHDL 2.5 05/05/2019   Lab Results  Component Value Date   WBC 9.3 08/18/2019   HGB 15.2 08/18/2019   HCT 44.2 08/18/2019   MCV 96 08/18/2019   PLT 425 08/18/2019   Attestation Statements:   Reviewed by clinician on day of visit: allergies, medications, problem list, medical history, surgical history, family history, social history, and previous encounter notes.  Time spent on visit including pre-visit chart review and post-visit care and charting was 30 minutes.   I, Water quality scientist, CMA, am acting as transcriptionist for Briscoe Deutscher, DO  I have reviewed the above documentation for accuracy and completeness, and I agree with the above. Briscoe Deutscher, DO

## 2020-02-07 ENCOUNTER — Other Ambulatory Visit: Payer: Self-pay | Admitting: Dermatology

## 2020-02-07 DIAGNOSIS — L709 Acne, unspecified: Secondary | ICD-10-CM

## 2020-02-09 ENCOUNTER — Ambulatory Visit: Payer: 59 | Admitting: Pharmacist

## 2020-02-09 DIAGNOSIS — E1169 Type 2 diabetes mellitus with other specified complication: Secondary | ICD-10-CM

## 2020-02-09 DIAGNOSIS — E785 Hyperlipidemia, unspecified: Secondary | ICD-10-CM

## 2020-02-09 DIAGNOSIS — E119 Type 2 diabetes mellitus without complications: Secondary | ICD-10-CM

## 2020-02-09 DIAGNOSIS — I10 Essential (primary) hypertension: Secondary | ICD-10-CM

## 2020-02-09 NOTE — Chronic Care Management (AMB) (Signed)
Care Management   Pharmacy Note  02/09/2020 Name: Veronica Norton MRN: [id number] DOB: [birth year]   Subjective:  Veronica Norton is a 44 y.o. year old female who is a primary care patient of Caryl Bis, Angela Adam, MD. The Care Management/Care Coordination team team was consulted for assistance with care management and care coordination needs.    Engaged with patient by telephone for follow up visit in response to provider referral for pharmacy case management and/or care coordination services.   Consent to Services:  Veronica Norton was given information about care management/care coordination services, agreed to services, and gave verbal consent prior to initiation of services. Please see initial visit note for detailed documentation.   Review of patient status, including review of consultants reports, laboratory and other test data, was performed as part of comprehensive evaluation and provision of chronic care management services.   SDOH (Social Determinants of Health) assessments and interventions performed:  none  Objective:  Lab Results  Component Value Date   CREATININE 0.86 01/21/2020   CREATININE 0.73 08/18/2019   CREATININE 0.73 05/05/2019    Lab Results  Component Value Date   HGBA1C 7.3 (H) 11/26/2019       Component Value Date/Time   CHOL 118 05/05/2019 1241   TRIG 65 05/05/2019 1241   HDL 48 05/05/2019 1241   CHOLHDL 2.5 05/05/2019 1241   CHOLHDL 3 01/15/2019 1629   VLDL 23.0 01/15/2019 1629   LDLCALC 56 05/05/2019 1241   LDLDIRECT 23.0 01/21/2020 0845    BP Readings from Last 3 Encounters:  01/26/20 121/82  01/01/20 118/76  12/04/19 121/84    Assessment:   No Known Allergies  Medications Reviewed Today    Reviewed by De Hollingshead, RPH-CPP (Pharmacist) on 02/09/20 at [place] List Status: <None>  Medication Order Taking? Sig Documenting Provider Last Dose Status Informant  APPLE CIDER VINEGAR PO [id number] Yes Take by mouth. 2-3 gummies  2 times per day [provider] Taking Active   clobetasol ointment (TEMOVATE) 0.05 % [id number] Yes Apply to stubborn areas of Eczema  For 2 weeks, then only on weekends. Avoid Face, groin, and under arms Moye, [place], MD Taking Active   Dapsone (ACZONE) 7.5 % GEL [id number] Yes Apply small amount once daily to affected areas [facility], [place], MD Taking Active   Dulaglutide (TRULICITY) 4.5 UG/8.9VQ SOPN [id number] Yes Inject 4.5 mg as directed once a week. Briscoe Deutscher, DO Taking Active   empagliflozin (JARDIANCE) 10 MG TABS tablet [id number] Yes Take 1 tablet (10 mg total) by mouth daily before breakfast. Leone Haven, MD Taking Active   fluticasone (CUTIVATE) 0.05 % cream [id number] Yes APPLY TO AFFECTED AREA UP TO TWICE A DAY AS NEEDED FOR RASH/ITCHING Leone Haven, MD Taking Active   glucose blood (FREESTYLE TEST STRIPS) test strip [id number] Yes Check sugar bid Jackolyn Confer, MD Taking Active   glucose monitoring kit (FREESTYLE) monitoring kit [id number] Yes Use as directed to check sugars bid Jackolyn Confer, MD Taking Active   hydrochlorothiazide (MICROZIDE) 12.5 MG capsule [id number] Yes TAKE 1 CAPSULE BY MOUTH EVERY DAY Leone Haven, MD Taking Active   Lancets [facility] ULTRASOFT) lancets [id number] Yes Use as instructed Jackolyn Confer, MD Taking Active   metFORMIN (GLUCOPHAGE-XR) 500 MG 24 hr tablet [id number] Yes Take 2 tablets (1,000 mg total) by mouth 2 (two) times daily. Briscoe Deutscher, DO Taking Active   norethindrone (CAMILA) 0.35 MG tablet [id number] Yes Take 1 tablet (0.35 mg total) by  mouth daily. Leone Haven, MD Taking Active   rosuvastatin (CRESTOR) 20 MG tablet [id number] Yes Take 1 tablet (20 mg total) by mouth daily. Leone Haven, MD Taking Active   spironolactone (ALDACTONE) 100 MG tablet [id number] Yes TAKE 1 TABLET BY MOUTH EVERY DAY Brendolyn Patty, MD Taking Active   Vitamin D, Ergocalciferol, (DRISDOL) 1.25 MG (50000 UNIT) CAPS  capsule [id number] Yes Take 1 capsule (50,000 Units total) by mouth every 7 (seven) days. Briscoe Deutscher, DO Taking Active           Patient Active Problem List   Diagnosis Date Noted  . Hirsutism 01/23/2020  . Hyperlipidemia associated with type 2 diabetes mellitus ([place]) 01/23/2020  . Bilateral leg edema 09/24/2019  . Abnormal mammogram 01/15/2019  . GERD (gastroesophageal reflux disease) 05/26/2018  . Anxiety and depression 07/03/2017  . Fatigue due to depression 07/03/2017  . Eczema 12/15/2016  . Severe obesity (BMI >= 40) ([place]) 09/04/2014  . Diabetes mellitus type 2 in obese ([place]) 04/17/2011  . Hypertension associated with diabetes ([place]) 04/17/2011    Medication Assistance: None required. Patient affirms current coverage meets needs.   Patient Care Plan: Medication Management    Problem Identified: Diabetes, Hypertension, Obesity     Long-Range Goal: Disease Progression Prevention   This Visit's Progress: On track  Priority: High  Note:   Current Barriers:  . Unable to achieve control of diabetes   Pharmacist Clinical Goal(s):  Marland Kitchen Over the next 90 days, patient will achieve control of diabetes as evidenced by A1c through collaboration with PharmD and provider  Interventions: . Inter-disciplinary care team collaboration (see longitudinal plan of care) . Comprehensive medication review performed; medication list updated in electronic medical record  Diabetes: . Uncontrolled; current treatment: metformin XR 1000 mg BID (though notes she is often forgetting second dose), Trulicity 4.5 mg weekly, Jardiance 10 mg daily. Does endorse 1 episode of vaginal itching that she believes was a yeast infection. Notes significant polyuria, but endorses staying hydrated . Current glucose readings: not checking . Counseled on goal A1c, goal fasting, and goal 2 hour post prandial  glucose readings. Discussed importance of taking medications at the full dose as prescribed to avoid  unnecessary addition of medications. Counseled that she could take entire 2 g dose of metformin at one time, if that would aid in adherence. If this increases GI upset, split dose but focus on adherence. She verbalized understanding. Recommended continuing current regimen and collaboration with weight management . Patient asked about medications if she were to become pregnant. Discussed preferred antihyperglycemic medications in pregnancy. Encouraged to communicate with healthcare team if she decides she wants to try to conceive.   Hypertension: . Controlled; current treatment: HCTZ 12.5 mg daily, spironolactone 100 mg QAM (likely PCOS) . Recommended to continue current regimen  Hyperlipidemia: . Controlled; current treatment: rosuvastatin 20 mg daily . Recommended to continue current regimen, praised on improvement in lipids. . Patient asked about medications if she were to become pregnant. Encouraged to communicate with healthcare team if she decides she wants to try to conceive.   Patient Goals/Self-Care Activities . Over the next 90 days, patient will:  - take medications as prescribed check blood glucose periodically, document, and provide at future appointments  Follow Up Plan: Telephone follow up appointment with care management team member scheduled for:~ 8 weeks      Plan: Telephone follow up appointment with care management team member scheduled for: ~ 8 weeks  Catie Darnelle Maffucci, PharmD, [place], CPP Clinical  Pharmacist [place] [phone]

## 2020-02-09 NOTE — Patient Instructions (Signed)
Visit Information  Patient Care Plan: Medication Management    Problem Identified: Diabetes, Hypertension, Obesity     Long-Range Goal: Disease Progression Prevention   This Visit's Progress: On track  Priority: High  Note:   Current Barriers:   Unable to achieve control of diabetes   Pharmacist Clinical Goal(s):   Over the next 90 days, patient will achieve control of diabetes as evidenced by A1c through collaboration with PharmD and provider  Interventions:  Inter-disciplinary care team collaboration (see longitudinal plan of care)  Comprehensive medication review performed; medication list updated in electronic medical record  Diabetes:  Uncontrolled; current treatment: metformin XR 1000 mg BID (though notes she is often forgetting second dose), Trulicity 4.5 mg weekly, Jardiance 10 mg daily. Does endorse 1 episode of vaginal itching that she believes was a yeast infection. Notes significant polyuria, but endorses staying hydrated  Current glucose readings: not checking  Counseled on goal A1c, goal fasting, and goal 2 hour post prandial  glucose readings. Discussed importance of taking medications at the full dose as prescribed to avoid unnecessary addition of medications. Counseled that she could take entire 2 g dose of metformin at one time, if that would aid in adherence. If this increases GI upset, split dose but focus on adherence. She verbalized understanding. Recommended continuing current regimen and collaboration with weight management  Patient asked about medications if she were to become pregnant. Discussed preferred antihyperglycemic medications in pregnancy. Encouraged to communicate with healthcare team if she decides she wants to try to conceive.   Hypertension:  Controlled; current treatment: HCTZ 12.5 mg daily, spironolactone 100 mg QAM (likely PCOS)  Recommended to continue current regimen  Hyperlipidemia:  Controlled; current treatment: rosuvastatin 20  mg daily  Recommended to continue current regimen, praised on improvement in lipids.  Patient asked about medications if she were to become pregnant. Encouraged to communicate with healthcare team if she decides she wants to try to conceive.   Patient Goals/Self-Care Activities  Over the next 90 days, patient will:  - take medications as prescribed check blood glucose periodically, document, and provide at future appointments  Follow Up Plan: Telephone follow up appointment with care management team member scheduled for:~ 8 weeks     The patient verbalized understanding of instructions, educational materials, and care plan provided today and declined offer to receive copy of patient instructions, educational materials, and care plan.  Plan: Telephone follow up appointment with care management team member scheduled for: ~ 8 weeks  Catie Darnelle Maffucci, PharmD, [place], [place] Pharmacist [place] [phone]

## 2020-02-19 ENCOUNTER — Ambulatory Visit: Payer: 59 | Admitting: Dermatology

## 2020-02-23 ENCOUNTER — Ambulatory Visit (INDEPENDENT_AMBULATORY_CARE_PROVIDER_SITE_OTHER): Payer: 59 | Admitting: Family Medicine

## 2020-02-23 ENCOUNTER — Encounter (INDEPENDENT_AMBULATORY_CARE_PROVIDER_SITE_OTHER): Payer: Self-pay

## 2020-02-25 ENCOUNTER — Other Ambulatory Visit: Payer: Self-pay

## 2020-02-26 ENCOUNTER — Encounter: Payer: Self-pay | Admitting: Family Medicine

## 2020-02-27 ENCOUNTER — Other Ambulatory Visit: Payer: Self-pay

## 2020-02-27 ENCOUNTER — Encounter: Payer: Self-pay | Admitting: Family Medicine

## 2020-02-27 ENCOUNTER — Ambulatory Visit: Payer: BC Managed Care – PPO | Admitting: Family Medicine

## 2020-02-27 DIAGNOSIS — F439 Reaction to severe stress, unspecified: Secondary | ICD-10-CM

## 2020-02-27 DIAGNOSIS — E559 Vitamin D deficiency, unspecified: Secondary | ICD-10-CM | POA: Diagnosis not present

## 2020-02-27 DIAGNOSIS — E1169 Type 2 diabetes mellitus with other specified complication: Secondary | ICD-10-CM | POA: Diagnosis not present

## 2020-02-27 DIAGNOSIS — E669 Obesity, unspecified: Secondary | ICD-10-CM | POA: Diagnosis not present

## 2020-02-27 LAB — MICROALBUMIN / CREATININE URINE RATIO
Creatinine,U: 80.5 mg/dL
Microalb Creat Ratio: 0.9 mg/g (ref 0.0–30.0)
Microalb, Ur: <0.7 mg/dL (ref 0.0–1.9)

## 2020-02-27 LAB — VITAMIN D 25 HYDROXY (VIT D DEFICIENCY, FRACTURES): VITD: 22.97 ng/mL — ABNORMAL LOW (ref 30.00–100.00)

## 2020-02-27 LAB — HEMOGLOBIN A1C: Hgb A1c MFr Bld: 7.6 % — ABNORMAL HIGH (ref 4.6–6.5)

## 2020-02-27 NOTE — Assessment & Plan Note (Signed)
Check A1c and urine microalbumin.  She will continue Trulicity, Jardiance, and Metformin as outlined above.

## 2020-02-27 NOTE — Assessment & Plan Note (Signed)
Check vitamin D. 

## 2020-02-27 NOTE — Patient Instructions (Signed)
Nice to see you. We will get lab work today and contact you with the results. Please try to get more sleep.  I suggest getting at least 7 to 8 hours of sleep nightly.

## 2020-02-27 NOTE — Progress Notes (Signed)
Tommi Rumps, MD Phone: [phone]  Veronica Norton is a 44 y.o. female who presents today for follow-up.  Obesity: Patient's weight has been stable over the last several months.  She has not been exercising.  Breakfast is generally healthy though she does eat a variety of things for lunch and dinner.  Does get some vegetables.  She is following with the medical weight loss clinic and she notes she has discussed the option of bariatric surgery with them.  She has completed the seminar and is waiting on the next step.  Diabetes: Not checking sugars.  Taking Trulicity 1.5 mg once weekly, Jardiance 10 mg once daily, and Metformin 1000 mg twice daily.  No polyuria or polydipsia.  No hypoglycemia.  Stress: Some stress as she works and goes to school full-time.  She has a little trouble sleeping with this and gets 5 to 6 hours of sleep nightly.  Vitamin D deficiency: No longer on a supplement.  Social History   Tobacco Use  Smoking Status Never Smoker  Smokeless Tobacco Never Used     ROS see history of present illness  Objective  Physical Exam Vitals:   02/27/20 1047  BP: 120/74  Pulse: 96  Temp: 98.5 F (36.9 C)    BP Readings from Last 3 Encounters:  02/27/20 120/74  01/26/20 121/82  01/01/20 118/76   Wt Readings from Last 3 Encounters:  02/27/20 276 lb 12.8 oz (125.6 kg)  01/26/20 276 lb (125.2 kg)  01/01/20 276 lb (125.2 kg)    Physical Exam Constitutional:      General: She is not in acute distress.    Appearance: She is not diaphoretic.  Cardiovascular:     Rate and Rhythm: Normal rate and regular rhythm.     Heart sounds: Normal heart sounds.  Pulmonary:     Effort: Pulmonary effort is normal.     Breath sounds: Normal breath sounds.  Musculoskeletal:        General: No edema.  Skin:    General: Skin is warm and dry.  Neurological:     Mental Status: She is alert.      Assessment/Plan: Please see individual problem list.  Problem List  Items Addressed This Visit    Diabetes mellitus type 2 in obese (HCC)    Check A1c and urine microalbumin.  She will continue Trulicity, Jardiance, and Metformin as outlined above.      Relevant Orders   Urine Microalbumin w/creat. ratio   HgB A1c   Severe obesity (BMI >= 40) (HCC)    Weight has been stable.  She has started the process of bariatric surgery evaluation.  She will proceed with this and see if her insurance covers this.  Otherwise I encouraged healthy diet and trying to work in exercise 2 to 3 days a week with walking.      Stress    Related to work and school.  She has a healthy relationship and notes that is helpful regarding the stress.  I encouraged her to get more sleep.      Vitamin D deficiency    Check vitamin D.      Relevant Orders   Vitamin D (25 hydroxy)      This visit occurred during the SARS-CoV-2 public health emergency.  Safety protocols were in place, including screening questions prior to the visit, additional usage of staff PPE, and extensive cleaning of exam room while observing appropriate contact time as indicated for disinfecting solutions.  Tommi Rumps, MD [place]

## 2020-02-27 NOTE — Assessment & Plan Note (Signed)
Related to work and school.  She has a healthy relationship and notes that is helpful regarding the stress.  I encouraged her to get more sleep.

## 2020-02-27 NOTE — Assessment & Plan Note (Signed)
Weight has been stable.  She has started the process of bariatric surgery evaluation.  She will proceed with this and see if her insurance covers this.  Otherwise I encouraged healthy diet and trying to work in exercise 2 to 3 days a week with walking.

## 2020-03-08 ENCOUNTER — Other Ambulatory Visit: Payer: Self-pay | Admitting: Family Medicine

## 2020-03-08 DIAGNOSIS — E669 Obesity, unspecified: Secondary | ICD-10-CM

## 2020-03-08 DIAGNOSIS — E559 Vitamin D deficiency, unspecified: Secondary | ICD-10-CM

## 2020-03-08 DIAGNOSIS — E1169 Type 2 diabetes mellitus with other specified complication: Secondary | ICD-10-CM

## 2020-03-08 MED ORDER — EMPAGLIFLOZIN 25 MG PO TABS: 25.0000 mg | ORAL_TABLET | Freq: Every day | ORAL | 1 refills | Status: DC

## 2020-03-10 ENCOUNTER — Other Ambulatory Visit: Payer: Self-pay | Admitting: Family Medicine

## 2020-03-10 DIAGNOSIS — E1169 Type 2 diabetes mellitus with other specified complication: Secondary | ICD-10-CM

## 2020-04-05 ENCOUNTER — Other Ambulatory Visit: Payer: Self-pay

## 2020-04-05 ENCOUNTER — Ambulatory Visit (INDEPENDENT_AMBULATORY_CARE_PROVIDER_SITE_OTHER): Payer: BC Managed Care – PPO | Admitting: Family Medicine

## 2020-04-05 ENCOUNTER — Telehealth: Payer: 59

## 2020-04-05 ENCOUNTER — Encounter (INDEPENDENT_AMBULATORY_CARE_PROVIDER_SITE_OTHER): Payer: Self-pay | Admitting: Family Medicine

## 2020-04-05 VITALS — BP 126/84 | HR 89 | Temp 98.3°F | Ht 65.0 in | Wt 273.0 lb

## 2020-04-05 DIAGNOSIS — E785 Hyperlipidemia, unspecified: Secondary | ICD-10-CM | POA: Diagnosis not present

## 2020-04-05 DIAGNOSIS — Z6841 Body Mass Index (BMI) 40.0 and over, adult: Secondary | ICD-10-CM

## 2020-04-05 DIAGNOSIS — Z9189 Other specified personal risk factors, not elsewhere classified: Secondary | ICD-10-CM | POA: Diagnosis not present

## 2020-04-05 DIAGNOSIS — E1159 Type 2 diabetes mellitus with other circulatory complications: Secondary | ICD-10-CM

## 2020-04-05 DIAGNOSIS — I152 Hypertension secondary to endocrine disorders: Secondary | ICD-10-CM

## 2020-04-05 DIAGNOSIS — E1169 Type 2 diabetes mellitus with other specified complication: Secondary | ICD-10-CM | POA: Diagnosis not present

## 2020-04-05 DIAGNOSIS — E119 Type 2 diabetes mellitus without complications: Secondary | ICD-10-CM

## 2020-04-06 ENCOUNTER — Telehealth: Payer: Self-pay

## 2020-04-06 NOTE — Chronic Care Management (AMB) (Signed)
  Care Management   Note  04/06/2020 Name: Veronica Norton MRN: [id number] DOB: [birth year]  Paulette Blanch Self is a 45 y.o. year old female who is a primary care patient of Leone Haven, MD and is actively engaged with the care management team. I reached out to [facility] by phone today to assist with re-scheduling a follow up visit with the Pharmacist  Follow up plan: Unsuccessful telephone outreach attempt made. A HIPAA compliant phone message was left for the patient providing contact information and requesting a return call.  The care management team will reach out to the patient again over the next 3 days.  If patient returns call to provider office, please advise to call [facility]  at [place], [place], [place], [place] [address] Direct Dial: [phone] [email or web address]

## 2020-04-06 NOTE — Chronic Care Management (AMB) (Signed)
  Care Management   Note  04/06/2020 Name: Shellene Sweigert MRN: [id number] DOB: 06-19-1975  Paulette Blanch Amacher is a 45 y.o. year old female who is a primary care patient of Leone Haven, MD and is actively engaged with the care management team. I reached out to [facility] by phone today to assist with re-scheduling a follow up visit with the Pharmacist  Follow up plan: Telephone appointment with care management team member scheduled for:04/23/2020  Noreene Larsson, [place], [place], [place] [address] Direct Dial: [phone] [email or web address]: [email or web address]

## 2020-04-07 NOTE — Progress Notes (Signed)
Chief Complaint:   OBESITY Veronica Norton is here to discuss her progress with her obesity treatment plan along with follow-up of her obesity related diagnoses.   Today's visit was #: 20 Starting weight: 284 lbs Starting date: 02/03/2019 Today's weight: 273 lbs Today's date: 04/06/2020 Total lbs lost to date: 11 lbs Body mass index is 45.43 kg/m.  Total weight loss percentage to date: -3.87%  Interim History: Veronica Norton went to the [facility]. Nutrition Plan: practicing portion control and making smarter food choices, such as increasing vegetables and decreasing simple carbohydrates.  Anti-obesity medications: Trulicity and metformin. Reported side effects: None. Activity: None at this time.  Assessment/Plan:   1. Type 2 diabetes mellitus without complication, without long-term current use of insulin (HCC) Diabetes Mellitus: needs improvement. Medication: Jardiance, metformin, and Trulicity. Issues reviewed with her: blood sugar goals, complications of diabetes mellitus, hypoglycemia prevention and treatment, exercise, and nutrition.  Lab Results  Component Value Date   HGBA1C 7.6 (H) 02/27/2020   HGBA1C 7.3 (H) 11/26/2019   HGBA1C 7.3 (H) 08/18/2019   Lab Results  Component Value Date   MICROALBUR <0.7 02/27/2020   LDLCALC 56 05/05/2019   CREATININE 0.86 01/21/2020   2. Hypertension associated with type 2 diabetes mellitus ([place]) At goal. Medications: HCTZ 12.5 mg daily.   Plan: Avoid buying foods that are: processed, frozen, or prepackaged to avoid excess salt. We will continue to monitor symptoms as they relate to her weight loss journey.  BP Readings from Last 3 Encounters:  04/05/20 126/84  02/27/20 120/74  01/26/20 121/82   Lab Results  Component Value Date   CREATININE 0.86 01/21/2020   3. Hyperlipidemia associated with type 2 diabetes mellitus (Veronica Norton) Course:  Stable.  Lipid-lowering medications: Crestor 20 mg daily.   Plan: Dietary changes: Increase  soluble fiber. Decrease simple carbohydrates. Exercise changes: An average 40 minutes of moderate to vigorous-intensity aerobic activity 3 or 4 times per week.   Lab Results  Component Value Date   CHOL 118 05/05/2019   HDL 48 05/05/2019   LDLCALC 56 05/05/2019   LDLDIRECT 23.0 01/21/2020   TRIG 65 05/05/2019   CHOLHDL 2.5 05/05/2019   Lab Results  Component Value Date   ALT 17 01/21/2020   AST 15 01/21/2020   ALKPHOS 73 01/21/2020   BILITOT 0.3 01/21/2020   4. At risk for heart disease Due to Veronica Norton's current state of health and medical condition(s), she is at a higher risk for heart disease.   This puts the patient at much greater risk to subsequently develop cardiopulmonary conditions that can significantly affect patient's quality of life in a negative manner as well.    At least 12 minutes was spent on counseling Veronica Norton about these concerns today. Initial goal is to lose at least 5-10% of starting weight to help reduce these risk factors.  We will continue to reassess these conditions on a fairly regular basis in an attempt to decrease patient's overall morbidity and mortality.  Evidence-based interventions for health behavior change were utilized today including the discussion of self monitoring techniques, problem-solving barriers and SMART goal setting techniques.  Specifically regarding patient's less desirable eating habits and patterns, we employed the technique of small changes when Veronica Norton has not been able to fully commit to her prudent nutritional plan.  5. Class 3 severe obesity with serious comorbidity and body mass index (BMI) of 45.0 to 49.9 in adult, unspecified obesity type Veronica Norton)  Course: Veronica Norton is currently in the action stage  of change. As such, her goal is to continue with weight loss efforts.   Nutrition goals: She has agreed to following a lower carbohydrate, vegetable and lean protein rich diet plan.   Exercise goals: For substantial health benefits, adults should  do at least 150 minutes (2 hours and 30 minutes) a week of moderate-intensity, or 75 minutes (1 hour and 15 minutes) a week of vigorous-intensity aerobic physical activity, or an equivalent combination of moderate- and vigorous-intensity aerobic activity. Aerobic activity should be performed in episodes of at least 10 minutes, and preferably, it should be spread throughout the week.  Behavioral modification strategies: increasing lean protein intake, decreasing simple carbohydrates, increasing vegetables, increasing water intake, decreasing liquid calories and decreasing alcohol intake.  Veronica Norton has agreed to follow-up with our clinic in 3-4 weeks. She was informed of the importance of frequent follow-up visits to maximize her success with intensive lifestyle modifications for her multiple health conditions.   Objective:   Blood pressure 126/84, pulse 89, temperature 98.3 F (36.8 C), temperature source Oral, height 5\' 5"  (1.651 m), weight 273 lb (123.8 kg), SpO2 92 %. Body mass index is 45.43 kg/m.  General: Cooperative, alert, well developed, in no acute distress. HEENT: Conjunctivae and lids unremarkable. Cardiovascular: Regular rhythm.  Lungs: Normal work of breathing. Neurologic: No focal deficits.   Lab Results  Component Value Date   CREATININE 0.86 01/21/2020   BUN 14 01/21/2020   NA 135 01/21/2020   K 4.4 01/21/2020   CL 101 01/21/2020   CO2 28 01/21/2020   Lab Results  Component Value Date   ALT 17 01/21/2020   AST 15 01/21/2020   ALKPHOS 73 01/21/2020   BILITOT 0.3 01/21/2020   Lab Results  Component Value Date   HGBA1C 7.6 (H) 02/27/2020   HGBA1C 7.3 (H) 11/26/2019   HGBA1C 7.3 (H) 08/18/2019   HGBA1C 7.3 (H) 05/05/2019   HGBA1C 10.9 (H) 01/15/2019   Lab Results  Component Value Date   TSH 1.49 09/24/2019   Lab Results  Component Value Date   CHOL 118 05/05/2019   HDL 48 05/05/2019   LDLCALC 56 05/05/2019   LDLDIRECT 23.0 01/21/2020   TRIG 65 05/05/2019    CHOLHDL 2.5 05/05/2019   Lab Results  Component Value Date   WBC 9.3 08/18/2019   HGB 15.2 08/18/2019   HCT 44.2 08/18/2019   MCV 96 08/18/2019   PLT 425 08/18/2019   Attestation Statements:   Reviewed by clinician on day of visit: allergies, medications, problem list, medical history, surgical history, family history, social history, and previous encounter notes.  I, Water quality scientist, CMA, am acting as transcriptionist for Briscoe Deutscher, DO  I have reviewed the above documentation for accuracy and completeness, and I agree with the above. Briscoe Deutscher, DO

## 2020-04-08 ENCOUNTER — Other Ambulatory Visit: Payer: Self-pay

## 2020-04-08 ENCOUNTER — Ambulatory Visit: Payer: BC Managed Care – PPO | Admitting: Dermatology

## 2020-04-08 ENCOUNTER — Ambulatory Visit: Payer: 59 | Admitting: Dermatology

## 2020-04-08 DIAGNOSIS — L821 Other seborrheic keratosis: Secondary | ICD-10-CM | POA: Diagnosis not present

## 2020-04-08 DIAGNOSIS — L700 Acne vulgaris: Secondary | ICD-10-CM | POA: Diagnosis not present

## 2020-04-08 DIAGNOSIS — L308 Other specified dermatitis: Secondary | ICD-10-CM | POA: Diagnosis not present

## 2020-04-08 MED ORDER — HYDROCORTISONE 2.5 % EX CREA: TOPICAL_CREAM | Freq: Two times a day (BID) | CUTANEOUS | 1 refills | Status: DC | PRN

## 2020-04-08 NOTE — Patient Instructions (Addendum)
Gentle Skin Care Guide  1. Bathe no more than once a day.  2. Avoid bathing in hot water  3. Use a mild soap like Dove, Vanicream, Cetaphil, CeraVe. Can use Lever 2000 or Cetaphil antibacterial soap  4. Use soap only where you need it. On most days, use it under your arms, between your legs, and on your feet. Let the water rinse other areas unless visibly dirty.  5. When you get out of the bath/shower, use a towel to gently blot your skin dry, don't rub it.  6. While your skin is still a little damp, apply a moisturizing cream such as Vanicream, CeraVe, Cetaphil, Eucerin, Sarna lotion or plain Vaseline Jelly. For hands apply Neutrogena [place] Hand Cream or Excipial Hand Cream.  7. Reapply moisturizer any time you start to itch or feel dry.  8. Sometimes using free and clear laundry detergents can be helpful. Fabric softener sheets should be avoided. Downy Free & Gentle liquid, or any liquid fabric softener that is free of dyes and perfumes, it acceptable to use  9. If your doctor has given you prescription creams you may apply moisturizers over them   Start HC 2.5% cream twice daily for up to 2 weeks to affected areas at face.  Continue clobetasol ointment twice a day as needed up to 2 weeks for the body  Topical steroids (such as triamcinolone, fluocinolone, fluocinonide, mometasone, clobetasol, halobetasol, betamethasone, hydrocortisone) can cause thinning and lightening of the skin if they are used for too long in the same area. Your physician has selected the right strength medicine for your problem and area affected on the body. Please use your medication only as directed by your physician to prevent side effects.

## 2020-04-08 NOTE — Progress Notes (Signed)
   Follow-Up Visit   Subjective  Veronica Norton is a 45 y.o. female who presents for the following: Acne (Follow up - She has stopped taking Spironolactone. She using Aczone ~ every other night.) and Other (PIH of right cheek - she did not get cream from [facility]. She is using an OTC cream from [facility]).  Patient not sure if spironolactone was making a difference. She's been off of it for about 1 month. She has continued getting dry areas at face. She has had eczema >1 year.   The following portions of the chart were reviewed this encounter and updated as appropriate:   Tobacco  Allergies  Meds  Problems  Med Hx  Surg Hx  Fam Hx      Review of Systems:  No other skin or systemic complaints except as noted in HPI or Assessment and Plan.  Objective  Well appearing patient in no apparent distress; mood and affect are within normal limits.  A focused examination was performed including face, neck, chest and back. Relevant physical exam findings are noted in the Assessment and Plan.  Objective  Face: Trace open comedones, rare inflammatory papule at jaw and neck Chest and back are clear  Objective  Face, neck: Scaly pink plaque at posterior neck   Assessment & Plan  Acne vulgaris Face  Chronic condition with expected duration over one year. Currently well-controlled.  Advised this may flare since she stopped spironolactone.   Increase Aczone 7.5% gel to daily to whole face. Advised this will not help with eczema.    Other eczema Face, neck  Chronic condition with duration over one year. Condition is bothersome to patient. Not currently at goal.  Start HC 2.5% cream twice daily for up to 2 weeks to affected areas at face.  Continue clobetasol ointment twice a day as needed up to 2 weeks for the body. Avoid applying to face, groin, and axilla. Use as directed. Risk of skin atrophy with long-term use reviewed.   Topical steroids (such as triamcinolone,  fluocinolone, fluocinonide, mometasone, clobetasol, halobetasol, betamethasone, hydrocortisone) can cause thinning and lightening of the skin if they are used for too long in the same area. Your physician has selected the right strength medicine for your problem and area affected on the body. Please use your medication only as directed by your physician to prevent side effects.    Ordered Medications: hydrocortisone 2.5 % cream  Seborrheic Keratoses - Stuck-on, waxy, tan-brown papules and plaques  - Discussed benign etiology and prognosis. - Observe - Call for any changes  Return in about 2 months (around 06/06/2020).  Graciella Belton, RMA, am acting as scribe for Forest Gleason, MD .  Documentation: I have reviewed the above documentation for accuracy and completeness, and I agree with the above.  Forest Gleason, MD

## 2020-04-11 ENCOUNTER — Encounter: Payer: Self-pay | Admitting: Dermatology

## 2020-04-12 ENCOUNTER — Telehealth: Payer: BC Managed Care – PPO

## 2020-04-23 ENCOUNTER — Ambulatory Visit: Payer: BC Managed Care – PPO | Admitting: Pharmacist

## 2020-04-23 DIAGNOSIS — I152 Hypertension secondary to endocrine disorders: Secondary | ICD-10-CM

## 2020-04-23 DIAGNOSIS — E669 Obesity, unspecified: Secondary | ICD-10-CM

## 2020-04-23 DIAGNOSIS — Z6841 Body Mass Index (BMI) 40.0 and over, adult: Secondary | ICD-10-CM

## 2020-04-23 DIAGNOSIS — E1159 Type 2 diabetes mellitus with other circulatory complications: Secondary | ICD-10-CM

## 2020-04-23 DIAGNOSIS — E1169 Type 2 diabetes mellitus with other specified complication: Secondary | ICD-10-CM

## 2020-04-23 NOTE — Chronic Care Management (AMB) (Signed)
Care Management   Pharmacy Note  04/23/2020 Name: Veronica Norton MRN: [id number] DOB: [birth year]  Subjective: Veronica Norton is a 45 y.o. year old female who is a primary care patient of Caryl Bis Angela Adam, MD. The Care Management team was consulted for assistance with care management and care coordination needs.    Engaged with patient by telephone for follow up visit in response to provider referral for pharmacy case management and/or care coordination services.   The patient was given information about Care Management services today including:  1. Care Management services includes personalized support from designated clinical staff supervised by the patient's primary care provider, including individualized plan of care and coordination with other care providers. 2. 24/7 contact phone numbers for assistance for urgent and routine care needs. 3. The patient may stop case management services at any time by phone call to the office staff.  Patient agreed to services and consent obtained.  Assessment:  Review of patient status, including review of consultants reports, laboratory and other test data, was performed as part of comprehensive evaluation and provision of chronic care management services.   SDOH (Social Determinants of Health) assessments and interventions performed:  SDOH Interventions   Flowsheet Row Most Recent Value  SDOH Interventions   Financial Strain Interventions Intervention Not Indicated       Objective:  Lab Results  Component Value Date   CREATININE 0.86 01/21/2020   CREATININE 0.73 08/18/2019   CREATININE 0.73 05/05/2019    Lab Results  Component Value Date   HGBA1C 7.6 (H) 02/27/2020       Component Value Date/Time   CHOL 118 05/05/2019 1241   TRIG 65 05/05/2019 1241   HDL 48 05/05/2019 1241   CHOLHDL 2.5 05/05/2019 1241   CHOLHDL 3 01/15/2019 1629   VLDL 23.0 01/15/2019 1629   LDLCALC 56 05/05/2019 1241   LDLDIRECT 23.0 01/21/2020  0845   Clinical ASCVD: No  The ASCVD Risk score Mikey Bussing DC Jr., et al., 2013) failed to calculate for the following reasons:   The valid total cholesterol range is 130 to 320 mg/dL     BP Readings from Last 3 Encounters:  04/05/20 126/84  02/27/20 120/74  01/26/20 121/82    Care Plan  No Known Allergies  Medications Reviewed Today    Reviewed by De Hollingshead, RPH-CPP (Pharmacist) on 04/23/20 at [place] List Status: <None>  Medication Order Taking? Sig Documenting Provider Last Dose Status Informant  clobetasol ointment (TEMOVATE) 0.05 % [id number] Yes Apply to stubborn areas of Eczema  For 2 weeks, then only on weekends. Avoid Face, groin, and under arms Moye, [place], MD Taking Active   Dapsone (ACZONE) 7.5 % GEL [id number] Yes Apply small amount once daily to affected areas [facility], [place], MD Taking Active   Dulaglutide (TRULICITY) 4.5 FY/1.0FB SOPN [id number] Yes Inject 4.5 mg as directed once a week. Briscoe Deutscher, DO Taking Active   fluticasone (CUTIVATE) 0.05 % cream [id number] Yes APPLY TO AFFECTED AREA UP TO TWICE A DAY AS NEEDED FOR RASH/ITCHING Leone Haven, MD Taking Active   glucose blood (FREESTYLE TEST STRIPS) test strip [id number] Yes Check sugar bid Jackolyn Confer, MD Taking Active   glucose monitoring kit (FREESTYLE) monitoring kit [id number] Yes Use as directed to check sugars bid Jackolyn Confer, MD Taking Active   hydrochlorothiazide (MICROZIDE) 12.5 MG capsule [id number] Yes TAKE 1 CAPSULE BY MOUTH EVERY DAY Leone Haven, MD Taking Active   hydrocortisone 2.5 % cream [id number]  Yes Apply topically 2 (two) times daily as needed (Rash). To face up to 2 weeks Moye, [place], MD Taking Active   JARDIANCE 10 MG TABS tablet [id number] Yes TAKE 1 TABLET (10 MG TOTAL) BY MOUTH DAILY BEFORE BREAKFAST. Leone Haven, MD Taking Active            Med Note Darnelle Maffucci, Arville Lime   Fri Apr 23, 2020  9:09 AM) Not taking regularly   Lancets Glory Rosebush  ULTRASOFT) lancets [id number] Yes Use as instructed Jackolyn Confer, MD Taking Active   metFORMIN (GLUCOPHAGE-XR) 500 MG 24 hr tablet [id number] Yes Take 2 tablets (1,000 mg total) by mouth 2 (two) times daily. Briscoe Deutscher, DO Taking Active            Med Note Darnelle Maffucci, Arville Lime   Fri Apr 23, 2020  9:02 AM)    norethindrone (CAMILA) 0.35 MG tablet [id number] Yes Take 1 tablet (0.35 mg total) by mouth daily. Leone Haven, MD Taking Active   rosuvastatin (CRESTOR) 20 MG tablet [id number] Yes Take 1 tablet (20 mg total) by mouth daily. Leone Haven, MD Taking Active   spironolactone (ALDACTONE) 100 MG tablet [id number] No TAKE 1 TABLET BY MOUTH EVERY DAY  Patient not taking: Reported on 04/23/2020   Brendolyn Patty, MD Not Taking Active           Patient Active Problem List   Diagnosis Date Noted  . Vitamin D deficiency 02/27/2020  . Hirsutism 01/23/2020  . Hyperlipidemia associated with type 2 diabetes mellitus ([place]) 01/23/2020  . Bilateral leg edema 09/24/2019  . Abnormal mammogram 01/15/2019  . GERD (gastroesophageal reflux disease) 05/26/2018  . Anxiety and depression 07/03/2017  . Fatigue due to depression 07/03/2017  . Eczema 12/15/2016  . Stress 08/15/2016  . Severe obesity (BMI >= 40) ([place]) 09/04/2014  . Diabetes mellitus type 2 in obese ([place]) 04/17/2011  . Hypertension associated with diabetes ([place]) 04/17/2011    Conditions to be addressed/monitored: HTN, HLD and DMII  Care Plan : Medication Management  Updates made by De Hollingshead, RPH-CPP since 04/23/2020 12:00 AM    Problem: Diabetes, Hypertension, Obesity     Long-Range Goal: Disease Progression Prevention   This Visit's Progress: On track  Recent Progress: On track  Priority: High  Note:   Current Barriers:  . Unable to achieve control of diabetes  . Unable to take medications regularly as prescribed  Pharmacist Clinical Goal(s):  Marland Kitchen Over the next 90 days, patient will achieve control of  diabetes as evidenced by A1c through collaboration with PharmD and provider  Interventions: . 1:1 collaboration with Leone Haven, MD regarding development and update of comprehensive plan of care as evidenced by provider attestation and co-signature . Inter-disciplinary care team collaboration (see longitudinal plan of care) . Comprehensive medication review performed; medication list updated in electronic medical record  Diabetes: . Uncontrolled; current treatment: metformin XR 1000 mg BID (though notes she is more often only taking 5726 mg daily), Trulicity 4.5 mg weekly, Jardiance 25 mg daily - increased by Dr. Caryl Bis in December, though patient never picked up increased dose. Notes that she generally doesn't take Jardiance 10 mg daily, sometimes taking every other day, because she has vaginal itching/burning. She noted this at our last visit but did not want to stop Jardiance.   . Current glucose readings: not checking . Discussing bariatric surgery w/ Dr. Juleen China. Patient is worried about drastic change in body shape and impact on her self identity.  Marland Kitchen  Discussed importance of taking medications at the full dose as prescribed to avoid unnecessary addition of medications. Reiterated that she could take entire 2 g dose of metformin at one time, if that would aid in adherence.  . Discontinue Jardiance due to GU irritation and lack of benefit with patient's current administration.  . Discussed changing Trulicity to [facility] for potentially greater glycemic and weight loss efficacy. Patient will discuss w/ Dr. Juleen China. Will also follow results of bariatric surgery pursuit.  . Encouraged to increase physical activity. Patient notes she plans on starting to talk again when the weather picks up  Hypertension: . Controlled; current treatment: HCTZ 12.5 mg daily, spironolactone 100 mg QAM (acne) - though patient not taking spironolactone daily, but notes she may restart soon.  . Discussed  concerns with starting and stopping MRA, need for evaluation of changes in potassium. Encouraged to communicate adherence with dermatology.  . Recommended to continue current regimen  Hyperlipidemia: . Controlled; current treatment: rosuvastatin 20 mg daily . Recommended to continue current regimen.  Contraception: . Appropriately managed; norethindrone 0.35 mg daily. Patient does confirm regular adherence to this medication today, though notes she would like to come off soon.  . Encouraged continued discussion with OBGYN regarding goals of therapy.   Patient Goals/Self-Care Activities . Over the next 90 days, patient will:  - take medications as prescribed check glucose periodically, document, and provide at future appointments target a minimum of 150 minutes of moderate intensity exercise weekly engage in dietary modifications by moderating portion sizes  Follow Up Plan: Telephone follow up appointment with care management team member scheduled for:~ 8 weeks     Medication Assistance:  None required.  Patient affirms current coverage meets needs.  Follow Up:  Patient agrees to Care Plan and Follow-up.  Plan: Telephone follow up appointment with care management team member scheduled for:  ~ 8 weeks  Catie Darnelle Maffucci, PharmD, [place], [place] Clinical Pharmacist [employer] at [employer] [phone]

## 2020-04-23 NOTE — Patient Instructions (Addendum)
Visit Information  Goals Addressed              This Visit's Progress     Patient Stated   .  Medication Monitoring (pt-stated)        Patient Goals/Self-Care Activities . Over the next 90 days, patient will:  - take medications as prescribed check glucose periodically, document, and provide at future appointments target a minimum of 150 minutes of moderate intensity exercise weekly engage in dietary modifications by moderating portion sizes       Patient verbalizes understanding of instructions provided today and agrees to view in [place].   Plan: Telephone follow up appointment with care management team member scheduled for:  ~ 8 weeks  Catie Darnelle Maffucci, PharmD, [place], [place] Clinical Pharmacist [employer] at [employer] [phone]

## 2020-04-24 ENCOUNTER — Other Ambulatory Visit (INDEPENDENT_AMBULATORY_CARE_PROVIDER_SITE_OTHER): Payer: Self-pay | Admitting: Family Medicine

## 2020-04-24 DIAGNOSIS — E119 Type 2 diabetes mellitus without complications: Secondary | ICD-10-CM

## 2020-04-26 NOTE — Telephone Encounter (Signed)
Last OV with Dr Wallace 

## 2020-04-27 ENCOUNTER — Encounter (INDEPENDENT_AMBULATORY_CARE_PROVIDER_SITE_OTHER): Payer: Self-pay

## 2020-04-27 NOTE — Telephone Encounter (Signed)
Message sent to pt.

## 2020-04-29 ENCOUNTER — Other Ambulatory Visit (INDEPENDENT_AMBULATORY_CARE_PROVIDER_SITE_OTHER): Payer: Self-pay | Admitting: Family Medicine

## 2020-04-29 ENCOUNTER — Telehealth (INDEPENDENT_AMBULATORY_CARE_PROVIDER_SITE_OTHER): Payer: Self-pay

## 2020-04-29 MED ORDER — WEGOVY 1.7 MG/0.75ML ~~LOC~~ SOAJ: 1.7000 mg | SUBCUTANEOUS | 0 refills | Status: DC

## 2020-04-29 NOTE — Telephone Encounter (Signed)
RX sent

## 2020-04-29 NOTE — Telephone Encounter (Signed)
-----   Message from Briscoe Deutscher, DO sent at 04/27/2020  7:20 AM EST ----- Hey! See messages below. I'm okay with going ahead and sending in [facility] 1.7 to see if covered.  ----- Message ----- From: De Hollingshead, RPH-CPP Sent: 04/26/2020   9:22 AM EST To: Briscoe Deutscher, DO  Me too!!!!! She did seem interested. Trulicity really seems to be the only medication she is taking consistently right now, so I hope the Mancel Parsons will go just as well.   Thanks!  Catie ----- Message ----- From: Briscoe Deutscher, DO Sent: 04/26/2020   9:12 AM EST To: De Hollingshead, RPH-CPP  I love [phone]. I'm happy to prescribe and see if it works for her. EW ----- Message ----- From: De Hollingshead, RPH-CPP Sent: 04/23/2020   9:37 AM EST To: Briscoe Deutscher, DO  Dr. Martina Sinner and I chatted today. She's only been taking Jardiance sporadically due to vaginal itching, and has only been consistently taking metformin once daily. I stopped Jardiance and we talked extensively about strategies to more reliably take the full 2000 mg dose of metformin.   We also talked a bit about Wegovy. Thoughts? I wonder if (pending insurance coverage and availability, obviously) it would give better glycemic and weight benefit than Trulicity. Will depend on what happens with the possibility of bariatric surgery, of course.   Thanks! Catie Darnelle Maffucci, PharmD, [place], [place] Clinical Pharmacist [employer] at [place]

## 2020-04-30 NOTE — Telephone Encounter (Signed)
Last OV with Dr Wallace 

## 2020-05-03 NOTE — Telephone Encounter (Signed)
Will work on PA

## 2020-05-04 NOTE — Telephone Encounter (Signed)
Key: [id number]

## 2020-05-06 ENCOUNTER — Encounter (INDEPENDENT_AMBULATORY_CARE_PROVIDER_SITE_OTHER): Payer: Self-pay | Admitting: Family Medicine

## 2020-05-06 ENCOUNTER — Ambulatory Visit (INDEPENDENT_AMBULATORY_CARE_PROVIDER_SITE_OTHER): Payer: BC Managed Care – PPO | Admitting: Family Medicine

## 2020-05-06 ENCOUNTER — Other Ambulatory Visit: Payer: Self-pay

## 2020-05-06 ENCOUNTER — Encounter (INDEPENDENT_AMBULATORY_CARE_PROVIDER_SITE_OTHER): Payer: Self-pay

## 2020-05-06 VITALS — BP 131/84 | HR 91 | Temp 97.9°F | Ht 65.0 in | Wt 278.0 lb

## 2020-05-06 DIAGNOSIS — F3289 Other specified depressive episodes: Secondary | ICD-10-CM | POA: Diagnosis not present

## 2020-05-06 DIAGNOSIS — E1169 Type 2 diabetes mellitus with other specified complication: Secondary | ICD-10-CM | POA: Diagnosis not present

## 2020-05-06 DIAGNOSIS — Z6841 Body Mass Index (BMI) 40.0 and over, adult: Secondary | ICD-10-CM

## 2020-05-06 DIAGNOSIS — Z9189 Other specified personal risk factors, not elsewhere classified: Secondary | ICD-10-CM | POA: Diagnosis not present

## 2020-05-06 DIAGNOSIS — I152 Hypertension secondary to endocrine disorders: Secondary | ICD-10-CM

## 2020-05-06 DIAGNOSIS — E1159 Type 2 diabetes mellitus with other circulatory complications: Secondary | ICD-10-CM | POA: Diagnosis not present

## 2020-05-07 ENCOUNTER — Other Ambulatory Visit: Payer: Self-pay

## 2020-05-07 ENCOUNTER — Other Ambulatory Visit (INDEPENDENT_AMBULATORY_CARE_PROVIDER_SITE_OTHER): Payer: BC Managed Care – PPO

## 2020-05-07 DIAGNOSIS — E559 Vitamin D deficiency, unspecified: Secondary | ICD-10-CM | POA: Diagnosis not present

## 2020-05-07 LAB — VITAMIN D 25 HYDROXY (VIT D DEFICIENCY, FRACTURES): VITD: 22.62 ng/mL — ABNORMAL LOW (ref 30.00–100.00)

## 2020-05-11 NOTE — Progress Notes (Signed)
Chief Complaint:   OBESITY Veronica Norton is here to discuss her progress with her obesity treatment plan along with follow-up of her obesity related diagnoses.   Today's visit was #: 21 Starting weight: 284 lbs Starting date: 02/03/2019 Today's weight: 278 lbs Today's date: 05/06/2020 Total lbs lost to date: 6 lbs Body mass index is 46.26 kg/m.  Total weight loss percentage to date: -2.11%  Interim History:  Veronica Norton says she is consistent with metformin but not with Jardiance. Current Meal Plan: the Category 1 Plan for 60% of the time.  Current Exercise Plan: None.  Assessment/Plan:   1. Type 2 diabetes mellitus with other specified complication, without long-term current use of insulin (HCC) Diabetes Mellitus: Not at goal. Medication: metformin 1,000 mg twice daily, Jardiance 25 mg daily. Issues reviewed: blood sugar goals, complications of diabetes mellitus, hypoglycemia prevention and treatment, exercise, and nutrition.   Plan:  Ok to stop Jardiance due to side effects of chronic vaginitis and not taking consistently.  Wegovy approved at 1.7 mg dose.  Sent.  Goal is 2.4 mg weekly.  Continue metformin The importance of regular follow up with PCP and all other specialists as scheduled was stressed to patient today.  Lab Results  Component Value Date   HGBA1C 7.6 (H) 02/27/2020   HGBA1C 7.3 (H) 11/26/2019   HGBA1C 7.3 (H) 08/18/2019   Lab Results  Component Value Date   MICROALBUR <0.7 02/27/2020   LDLCALC 56 05/05/2019   CREATININE 0.86 01/21/2020   2. Hypertension associated with type 2 diabetes mellitus (HCC) Borderline.  Medications: HCTZ 12.5 mg daily.   Plan: Avoid buying foods that are: processed, frozen, or prepackaged to avoid excess salt. We will continue to monitor closely alongside her PCP and/or Specialist.  Regular follow up with PCP and specialists was also encouraged.   BP Readings from Last 3 Encounters:  05/06/20 131/84  04/05/20 126/84  02/27/20 120/74    Lab Results  Component Value Date   CREATININE 0.86 01/21/2020   3. Other depression, with emotional eating Controlled. Medication: None.  Plan:  Behavior modification techniques were discussed today to help deal with emotional/non-hunger eating behaviors.  4. At risk for heart disease Due to Veronica Norton's current state of health and medical condition(s), she is at a higher risk for heart disease.  This puts the patient at much greater risk to subsequently develop cardiopulmonary conditions that can significantly affect patient's quality of life in a negative manner.    At least 10 minutes were spent on counseling Veronica Norton about these concerns today. Evidence-based interventions for health behavior change were utilized today including the discussion of self monitoring techniques, problem-solving barriers, and SMART goal setting techniques.  Specifically, regarding patient's less desirable eating habits and patterns, we employed the technique of small changes when Veronica Norton has not been able to fully commit to her prudent nutritional plan.  5. Class 3 severe obesity with serious comorbidity and body mass index (BMI) of 45.0 to 49.9 in adult, unspecified obesity type [facility])  Course: Veronica Norton is currently in the action stage of change. As such, her goal is to continue with weight loss efforts.   Nutrition goals: She has agreed to the Category 1 Plan.   Exercise goals: For substantial health benefits, adults should do at least 150 minutes (2 hours and 30 minutes) a week of moderate-intensity, or 75 minutes (1 hour and 15 minutes) a week of vigorous-intensity aerobic physical activity, or an equivalent combination of moderate- and vigorous-intensity aerobic activity.  Aerobic activity should be performed in episodes of at least 10 minutes, and preferably, it should be spread throughout the week.  Behavioral modification strategies: increasing lean protein intake, decreasing simple carbohydrates, increasing  vegetables, increasing water intake and decreasing liquid calories.  Veronica Norton has agreed to follow-up with our clinic in 4 weeks. She was informed of the importance of frequent follow-up visits to maximize her success with intensive lifestyle modifications for her multiple health conditions.   Objective:   Blood pressure 131/84, pulse 91, temperature 97.9 F (36.6 C), temperature source Oral, height 5\' 5"  (1.651 m), weight 278 lb (126.1 kg), SpO2 94 %. Body mass index is 46.26 kg/m.  General: Cooperative, alert, well developed, in no acute distress. HEENT: Conjunctivae and lids unremarkable. Cardiovascular: Regular rhythm.  Lungs: Normal work of breathing. Neurologic: No focal deficits.   Lab Results  Component Value Date   CREATININE 0.86 01/21/2020   BUN 14 01/21/2020   NA 135 01/21/2020   K 4.4 01/21/2020   CL 101 01/21/2020   CO2 28 01/21/2020   Lab Results  Component Value Date   ALT 17 01/21/2020   AST 15 01/21/2020   ALKPHOS 73 01/21/2020   BILITOT 0.3 01/21/2020   Lab Results  Component Value Date   HGBA1C 7.6 (H) 02/27/2020   HGBA1C 7.3 (H) 11/26/2019   HGBA1C 7.3 (H) 08/18/2019   HGBA1C 7.3 (H) 05/05/2019   HGBA1C 10.9 (H) 01/15/2019   Lab Results  Component Value Date   TSH 1.49 09/24/2019   Lab Results  Component Value Date   CHOL 118 05/05/2019   HDL 48 05/05/2019   LDLCALC 56 05/05/2019   LDLDIRECT 23.0 01/21/2020   TRIG 65 05/05/2019   CHOLHDL 2.5 05/05/2019   Lab Results  Component Value Date   WBC 9.3 08/18/2019   HGB 15.2 08/18/2019   HCT 44.2 08/18/2019   MCV 96 08/18/2019   PLT 425 08/18/2019   Attestation Statements:   Reviewed by clinician on day of visit: allergies, medications, problem list, medical history, surgical history, family history, social history, and previous encounter notes.  I, Water quality scientist, CMA, am acting as transcriptionist for Briscoe Deutscher, DO  I have reviewed the above documentation for accuracy and  completeness, and I agree with the above. Briscoe Deutscher, DO

## 2020-05-31 DIAGNOSIS — Z20822 Contact with and (suspected) exposure to covid-19: Secondary | ICD-10-CM | POA: Diagnosis not present

## 2020-06-01 ENCOUNTER — Telehealth: Payer: Self-pay | Admitting: Pharmacist

## 2020-06-01 ENCOUNTER — Telehealth: Payer: BC Managed Care – PPO

## 2020-06-01 NOTE — Telephone Encounter (Signed)
°  Chronic Care Management   Note  06/01/2020 Name: Lossie Kalp MRN: [id number] DOB: 1975-06-23   Attempted to contact patient for scheduled appointment for medication management support. Left HIPAA compliant message for patient to return my call at their convenience. Noted that patient was tested for COVID yesterday.   Plan: - If I do not hear back from the patient by end of business today, will collaborate with Care Guide to outreach to schedule follow up with me   Catie Darnelle Maffucci, PharmD, [place], [place] Pharmacist [employer] at [employer] [phone]

## 2020-06-04 ENCOUNTER — Telehealth: Payer: Self-pay

## 2020-06-04 NOTE — Chronic Care Management (AMB) (Signed)
  Care Management   Note  06/04/2020 Name: Veronica Norton MRN: [id number] DOB: 12-24-1975  Veronica Norton is a 45 y.o. year old female who is a primary care patient of Leone Haven, MD and is actively engaged with the care management team. I reached out to [facility] by phone today to assist with re-scheduling a follow up visit with the Pharmacist  Follow up plan: Unsuccessful telephone outreach attempt made. A HIPAA compliant phone message was left for the patient providing contact information and requesting a return call.  The care management team will reach out to the patient again over the next 7 days.  If patient returns call to provider office, please advise to call [place]  at [place], [place], [place], [place] [address] Direct Dial: [phone] [email or web address]: [email or web address]

## 2020-06-07 ENCOUNTER — Ambulatory Visit (INDEPENDENT_AMBULATORY_CARE_PROVIDER_SITE_OTHER): Payer: BC Managed Care – PPO | Admitting: Family Medicine

## 2020-06-07 ENCOUNTER — Other Ambulatory Visit: Payer: Self-pay

## 2020-06-07 ENCOUNTER — Encounter (INDEPENDENT_AMBULATORY_CARE_PROVIDER_SITE_OTHER): Payer: Self-pay | Admitting: Family Medicine

## 2020-06-07 VITALS — BP 125/80 | HR 92 | Temp 98.3°F | Ht 65.0 in | Wt 281.0 lb

## 2020-06-07 DIAGNOSIS — I152 Hypertension secondary to endocrine disorders: Secondary | ICD-10-CM

## 2020-06-07 DIAGNOSIS — R632 Polyphagia: Secondary | ICD-10-CM

## 2020-06-07 DIAGNOSIS — E1159 Type 2 diabetes mellitus with other circulatory complications: Secondary | ICD-10-CM

## 2020-06-07 DIAGNOSIS — E1169 Type 2 diabetes mellitus with other specified complication: Secondary | ICD-10-CM | POA: Diagnosis not present

## 2020-06-07 DIAGNOSIS — Z6841 Body Mass Index (BMI) 40.0 and over, adult: Secondary | ICD-10-CM

## 2020-06-07 DIAGNOSIS — E785 Hyperlipidemia, unspecified: Secondary | ICD-10-CM

## 2020-06-07 DIAGNOSIS — Z9189 Other specified personal risk factors, not elsewhere classified: Secondary | ICD-10-CM

## 2020-06-14 ENCOUNTER — Other Ambulatory Visit (INDEPENDENT_AMBULATORY_CARE_PROVIDER_SITE_OTHER): Payer: Self-pay | Admitting: Family Medicine

## 2020-06-14 NOTE — Telephone Encounter (Signed)
Last seen by Dr. Wallace. 

## 2020-06-15 ENCOUNTER — Encounter (INDEPENDENT_AMBULATORY_CARE_PROVIDER_SITE_OTHER): Payer: Self-pay | Admitting: Family Medicine

## 2020-06-15 DIAGNOSIS — Z20822 Contact with and (suspected) exposure to covid-19: Secondary | ICD-10-CM | POA: Diagnosis not present

## 2020-06-15 MED ORDER — WEGOVY 2.4 MG/0.75ML ~~LOC~~ SOAJ: 2.4000 mg | SUBCUTANEOUS | 0 refills | Status: DC

## 2020-06-15 NOTE — Progress Notes (Signed)
Chief Complaint:   OBESITY Veronica Norton is here to discuss her progress with her obesity treatment plan along with follow-up of her obesity related diagnoses.   Today's visit was #: 22 Starting weight: 284 lbs Starting date: 02/03/2019 Today's weight: 281 lbs Today's date: 06/07/2020 Total lbs lost to date: 3 lbs Body mass index is 46.76 kg/m.  Total weight loss percentage to date: -1.06%  Personal statement as dictated ot Dr. Juleen China during visit today:  "I want surgery because I want to lose weight to get rid of diabetes and be able to get off medications and optimize my health for pregnancy." Current Meal Plan: the Category 1 Plan for 55% of the time.  Current Exercise Plan: Increased walking. Current Anti-Obesity Medications: Wegovy 1.7 mg subcutaneously weekly, metformin XR 1,000 mg twice daily. Side effects: None.  Assessment/Plan:   1. Polyphagia Not at goal. Current treatment: Wegovy 1.7 mg subcutaneously weekly, metformin XR 1,000 mg twice daily. Polyphagia refers to excessive feelings of hunger. She will continue to focus on protein-rich, low simple carbohydrate foods. We reviewed the importance of hydration, regular exercise for stress reduction, and restorative sleep.  Plan:  Will increase Wegovy to 2.4 mg subcutaneously weekly today, as per below.  Continue metformin.  - Increase Semaglutide-Weight Management (WEGOVY) 2.4 MG/0.75ML SOAJ; Inject 2.4 mg into the skin once a week.  Dispense: 9 mL; Refill: 0  2. Type 2 diabetes mellitus with other specified complication, without long-term current use of insulin (HCC) Diabetes Mellitus: Not at goal. Medication: metformin XR 1,000 mg twice daily, Wegovy 1.7 mg subcutaneously weekly. Issues reviewed: blood sugar goals, complications of diabetes mellitus, hypoglycemia prevention and treatment, exercise, and nutrition.   Plan:  Continue metformin.  Will increase Wegovy today, as per above.  The importance of regular follow up with  PCP and all other specialists as scheduled was stressed to patient today.  Lab Results  Component Value Date   HGBA1C 7.6 (H) 02/27/2020   HGBA1C 7.3 (H) 11/26/2019   HGBA1C 7.3 (H) 08/18/2019   Lab Results  Component Value Date   MICROALBUR <0.7 02/27/2020   LDLCALC 56 05/05/2019   CREATININE 0.86 01/21/2020   3. Hypertension associated with type 2 diabetes mellitus ([place]) At goal. Medications: HCTZ 12.5 mg daily, spironolactone 100 mg daily.   Plan: Avoid buying foods that are: processed, frozen, or prepackaged to avoid excess salt. We will watch for signs of hypotension as she continues lifestyle modifications. We will continue to monitor closely alongside her PCP and/or Specialist.  Regular follow up with PCP and specialists was also encouraged.   BP Readings from Last 3 Encounters:  06/07/20 125/80  05/06/20 131/84  04/05/20 126/84   Lab Results  Component Value Date   CREATININE 0.86 01/21/2020   4. Hyperlipidemia associated with type 2 diabetes mellitus ([place]) Course: Controlled. Lipid-lowering medications: Crestor 20 mg daily.   Plan: Dietary changes: Increase soluble fiber, decrease simple carbohydrates, decrease saturated fat. Exercise changes: Moderate to vigorous-intensity aerobic activity 150 minutes per week or as tolerated. We will continue to monitor along with PCP/specialists as it pertains to her weight loss journey.  Lab Results  Component Value Date   CHOL 118 05/05/2019   HDL 48 05/05/2019   LDLCALC 56 05/05/2019   LDLDIRECT 23.0 01/21/2020   TRIG 65 05/05/2019   CHOLHDL 2.5 05/05/2019   Lab Results  Component Value Date   ALT 17 01/21/2020   AST 15 01/21/2020   ALKPHOS 73 01/21/2020  BILITOT 0.3 01/21/2020   5. At risk for heart disease Due to Veronica Norton's current state of health and medical condition(s), she is at a higher risk for heart disease.  This puts the patient at much greater risk to subsequently develop cardiopulmonary conditions that can  significantly affect patient's quality of life in a negative manner.    At least 9 minutes were spent on counseling Veronica Norton about these concerns today. Evidence-based interventions for health behavior change were utilized today including the discussion of self monitoring techniques, problem-solving barriers, and SMART goal setting techniques.  Specifically, regarding patient's less desirable eating habits and patterns, we employed the technique of small changes when Veronica Norton has not been able to fully commit to her prudent nutritional plan.  6. Obesity, current BMI 46.8  Course: Veronica Norton is currently in the action stage of change. As such, her goal is to continue with weight loss efforts.   Nutrition goals: She has agreed to the Category 1 Plan.   Exercise goals: For substantial health benefits, adults should do at least 150 minutes (2 hours and 30 minutes) a week of moderate-intensity, or 75 minutes (1 hour and 15 minutes) a week of vigorous-intensity aerobic physical activity, or an equivalent combination of moderate- and vigorous-intensity aerobic activity. Aerobic activity should be performed in episodes of at least 10 minutes, and preferably, it should be spread throughout the week.  Behavioral modification strategies: increasing lean protein intake, decreasing simple carbohydrates, increasing vegetables and increasing water intake.  Veronica Norton has agreed to follow-up with our clinic in 4 weeks. She was informed of the importance of frequent follow-up visits to maximize her success with intensive lifestyle modifications for her multiple health conditions.   Objective:   Blood pressure 125/80, pulse 92, temperature 98.3 F (36.8 C), temperature source Oral, height 5\' 5"  (1.651 m), weight 281 lb (127.5 kg), SpO2 97 %. Body mass index is 46.76 kg/m.  General: Cooperative, alert, well developed, in no acute distress. HEENT: Conjunctivae and lids unremarkable. Cardiovascular: Regular rhythm.  Lungs:  Normal work of breathing. Neurologic: No focal deficits.   Lab Results  Component Value Date   CREATININE 0.86 01/21/2020   BUN 14 01/21/2020   NA 135 01/21/2020   K 4.4 01/21/2020   CL 101 01/21/2020   CO2 28 01/21/2020   Lab Results  Component Value Date   ALT 17 01/21/2020   AST 15 01/21/2020   ALKPHOS 73 01/21/2020   BILITOT 0.3 01/21/2020   Lab Results  Component Value Date   HGBA1C 7.6 (H) 02/27/2020   HGBA1C 7.3 (H) 11/26/2019   HGBA1C 7.3 (H) 08/18/2019   HGBA1C 7.3 (H) 05/05/2019   HGBA1C 10.9 (H) 01/15/2019   Lab Results  Component Value Date   TSH 1.49 09/24/2019   Lab Results  Component Value Date   CHOL 118 05/05/2019   HDL 48 05/05/2019   LDLCALC 56 05/05/2019   LDLDIRECT 23.0 01/21/2020   TRIG 65 05/05/2019   CHOLHDL 2.5 05/05/2019   Lab Results  Component Value Date   WBC 9.3 08/18/2019   HGB 15.2 08/18/2019   HCT 44.2 08/18/2019   MCV 96 08/18/2019   PLT 425 08/18/2019   Attestation Statements:   Reviewed by clinician on day of visit: allergies, medications, problem list, medical history, surgical history, family history, social history, and previous encounter notes.  I, Water quality scientist, CMA, am acting as transcriptionist for Briscoe Deutscher, DO  I have reviewed the above documentation for accuracy and completeness, and I agree with  the above. Briscoe Deutscher, DO

## 2020-06-16 NOTE — Chronic Care Management (AMB) (Signed)
  Care Management   Note  06/16/2020 Name: [name] MRN: [id number] DOB: [birth year]  Veronica Norton is a 45 y.o. year old female who is a primary care patient of Leone Haven, MD and is actively engaged with the care management team. I reached out to [facility] by phone today to assist with re-scheduling a follow up visit with the Pharmacist  Follow up plan: Unsuccessful telephone outreach attempt made. A HIPAA compliant phone message was left for the patient providing contact information and requesting a return call.  The care management team will reach out to the patient again over the next 7 days.  If patient returns call to provider office, please advise to call [place]  at [place], [place], [place], [place] [address] Direct Dial: [phone] [email or web address]: [email or web address]

## 2020-06-17 ENCOUNTER — Ambulatory Visit: Payer: BC Managed Care – PPO | Admitting: Dermatology

## 2020-06-23 NOTE — Telephone Encounter (Signed)
3rd unsuccessful outreach  

## 2020-06-23 NOTE — Chronic Care Management (AMB) (Signed)
  Care Management   Note  06/23/2020 Name: Tymber Stallings MRN: [id number] DOB: [birth year]  Paulette Blanch Owusu is a 45 y.o. year old female who is a primary care patient of Leone Haven, MD and is actively engaged with the care management team. I reached out to [facility] by phone today to assist with re-scheduling a follow up visit with the Pharmacist  Follow up plan: Telephone appointment with care management team member scheduled for:07/22/2020  Noreene Larsson, [place], [place], [place] [address] Direct Dial: [phone] [email or web address]: [email or web address]

## 2020-06-23 NOTE — Chronic Care Management (AMB) (Signed)
  Care Management   Note  06/23/2020 Name: Veronica Norton MRN: [id number] DOB: 1975-10-23  Veronica Norton is a 45 y.o. year old female who is a primary care patient of Leone Haven, MD and is actively engaged with the care management team. I reached out to [facility] by phone today to assist with re-scheduling a follow up visit with the Pharmacist  Follow up plan: Unable to make contact on outreach attempts x 3. PCP Leone Haven, MD notified via routed documentation in medical record.  We have been unable to make contact with the patient for follow up. The care management team is available to follow up with the patient after provider conversation with the patient regarding recommendation for care management engagement and subsequent re-referral to the care management team.   Veronica Norton, [place], [place], [place] [address] Direct Dial: [phone] [email or web address]: [email or web address]

## 2020-06-24 ENCOUNTER — Other Ambulatory Visit: Payer: Self-pay | Admitting: Family Medicine

## 2020-06-24 DIAGNOSIS — E119 Type 2 diabetes mellitus without complications: Secondary | ICD-10-CM

## 2020-07-01 DIAGNOSIS — Z20822 Contact with and (suspected) exposure to covid-19: Secondary | ICD-10-CM | POA: Diagnosis not present

## 2020-07-02 ENCOUNTER — Ambulatory Visit (INDEPENDENT_AMBULATORY_CARE_PROVIDER_SITE_OTHER): Payer: BC Managed Care – PPO | Admitting: Family Medicine

## 2020-07-02 ENCOUNTER — Other Ambulatory Visit: Payer: Self-pay

## 2020-07-02 ENCOUNTER — Encounter: Payer: Self-pay | Admitting: Family Medicine

## 2020-07-02 DIAGNOSIS — E1169 Type 2 diabetes mellitus with other specified complication: Secondary | ICD-10-CM | POA: Diagnosis not present

## 2020-07-02 DIAGNOSIS — I152 Hypertension secondary to endocrine disorders: Secondary | ICD-10-CM

## 2020-07-02 DIAGNOSIS — E1159 Type 2 diabetes mellitus with other circulatory complications: Secondary | ICD-10-CM | POA: Diagnosis not present

## 2020-07-02 DIAGNOSIS — E669 Obesity, unspecified: Secondary | ICD-10-CM

## 2020-07-02 NOTE — Progress Notes (Signed)
Virtual Visit via video Note  This visit type was conducted due to national recommendations for restrictions regarding the COVID-19 pandemic (e.g. social distancing).  This format is felt to be most appropriate for this patient at this time.  All issues noted in this document were discussed and addressed.  No physical exam was performed (except for noted visual exam findings with Video Visits).   I connected with Veronica Norton today at  9:30 AM EDT by a video enabled telemedicine application and verified that I am speaking with the correct person using two identifiers. Location patient: home Location provider: work  Persons participating in the virtual visit: patient, provider  I discussed the limitations, risks, security and privacy concerns of performing an evaluation and management service by telephone and the availability of in person appointments. I also discussed with the patient that there may be a patient responsible charge related to this service. The patient expressed understanding and agreed to proceed.   Reason for visit: f/u.  HPI: DIABETES Disease Monitoring: Blood Sugar ranges-not checking. Polyuria/phagia/dipsia- no      Optho- UTD Medications: Compliance- taking wegovy, metformin Hypoglycemic symptoms- no  HYPERTENSION  Disease Monitoring  Home BP Monitoring reports this is fine, was well controlled at weight management Chest pain- no    Dyspnea- no Medications  Compliance-  Taking spironolactone, HCTZ. Edema- no  Morbid obesity: Somewhat healthy diet.  He has a bagel and [place] sausage and fruit for breakfast.  Lunch and dinner oftentimes very.  She is try to get out more for activity though is not doing specific exercise.  She follows with weight management clinic though is also seeing Dr. Redmond Pulling at [facility] general surgery for discussion on weight loss surgery.     ROS: See pertinent positives and negatives per HPI.  Past Medical History:  Diagnosis  Date  . Diabetes mellitus   . H/O pilonidal cyst   . Hypertension   . Normal cardiac stress test 2009  . Pre-diabetes     Past Surgical History:  Procedure Laterality Date  . PILONIDAL CYST EXCISION    . WISDOM TOOTH EXTRACTION      Family History  Problem Relation Age of Onset  . Heart disease Mother        2005 stent placement  . Hypertension Mother   . Diabetes Father   . Hypertension Father   . Obesity Father   . Stroke Maternal Aunt   . Cancer Maternal Grandfather        prostate  . Heart disease Maternal Grandfather   . Heart disease Maternal Uncle   . Heart disease Maternal Grandmother     SOCIAL HX: Non-smoker   Current Outpatient Medications:  .  clobetasol ointment (TEMOVATE) 0.05 %, Apply to stubborn areas of Eczema  For 2 weeks, then only on weekends. Avoid Face, groin, and under arms, Disp: 45 g, Rfl: 3 .  Dapsone (ACZONE) 7.5 % GEL, Apply small amount once daily to affected areas, Disp: 60 g, Rfl: 2 .  fluticasone (CUTIVATE) 0.05 % cream, APPLY TO AFFECTED AREA UP TO TWICE A DAY AS NEEDED FOR RASH/ITCHING, Disp: 60 g, Rfl: 0 .  glucose blood (FREESTYLE TEST STRIPS) test strip, Check sugar bid, Disp: 100 each, Rfl: 5 .  glucose monitoring kit (FREESTYLE) monitoring kit, Use as directed to check sugars bid, Disp: 1 each, Rfl: 0 .  hydrochlorothiazide (MICROZIDE) 12.5 MG capsule, TAKE 1 CAPSULE BY MOUTH EVERY DAY, Disp: 90 capsule, Rfl: 1 .  hydrocortisone 2.5 % cream, Apply topically 2 (two) times daily as needed (Rash). To face up to 2 weeks, Disp: 30 g, Rfl: 1 .  Lancets (ONETOUCH ULTRASOFT) lancets, Use as instructed, Disp: 100 each, Rfl: 5 .  metFORMIN (GLUCOPHAGE-XR) 500 MG 24 hr tablet, TAKE 2 TABLETS BY MOUTH TWICE A DAY, Disp: 360 tablet, Rfl: 1 .  norethindrone (CAMILA) 0.35 MG tablet, Take 1 tablet (0.35 mg total) by mouth daily., Disp: 28 tablet, Rfl: 6 .  rosuvastatin (CRESTOR) 20 MG tablet, Take 1 tablet (20 mg total) by mouth daily., Disp: 90  tablet, Rfl: 3 .  Semaglutide-Weight Management (WEGOVY) 2.4 MG/0.75ML SOAJ, Inject 2.4 mg into the skin once a week., Disp: 9 mL, Rfl: 0 .  spironolactone (ALDACTONE) 100 MG tablet, TAKE 1 TABLET BY MOUTH EVERY DAY, Disp: 90 tablet, Rfl: 0  EXAM:  VITALS per patient if applicable:  GENERAL: alert, oriented, appears well and in no acute distress  HEENT: atraumatic, conjunttiva clear, no obvious abnormalities on inspection of external nose and ears  NECK: normal movements of the head and neck  LUNGS: on inspection no signs of respiratory distress, breathing rate appears normal, no obvious gross SOB, gasping or wheezing  CV: no obvious cyanosis  MS: moves all visible extremities without noticeable abnormality  PSYCH/NEURO: pleasant and cooperative, no obvious depression or anxiety, speech and thought processing grossly intact  ASSESSMENT AND PLAN:  Discussed the following assessment and plan:  Problem List Items Addressed This Visit    Diabetes mellitus type 2 in obese [facility])    She will continue Wegovy 2.4 mg weekly and metformin XR 1000 mg twice daily.  She will return for lab work.      Relevant Orders   HgB A1c   Hypertension associated with diabetes ([place])    Seems to be adequately controlled.  She will continue spironolactone 100 mg once daily and HCTZ 12.5 mg daily.  She will come in for labs in about 3 weeks.      Relevant Orders   Lipid panel   Comp Met (CMET)   Severe obesity (BMI >= 40) (HCC)    Encouraged healthy diet and exercise.  She is going to see general surgery to discuss weight loss surgery in the next several weeks.  Discussed that she really needs to have healthy diet and exercise practices in place as she goes into the surgery if it is an option.      Relevant Orders   Lipid panel   Comp Met (CMET)       I discussed the assessment and treatment plan with the patient. The patient was provided an opportunity to ask questions and all were answered.  The patient agreed with the plan and demonstrated an understanding of the instructions.   The patient was advised to call back or seek an in-person evaluation if the symptoms worsen or if the condition fails to improve as anticipated.  Tommi Rumps, MD

## 2020-07-02 NOTE — Assessment & Plan Note (Signed)
Seems to be adequately controlled.  She will continue spironolactone 100 mg once daily and HCTZ 12.5 mg daily.  She will come in for labs in about 3 weeks.

## 2020-07-02 NOTE — Assessment & Plan Note (Signed)
She will continue Wegovy 2.4 mg weekly and metformin XR 1000 mg twice daily.  She will return for lab work.

## 2020-07-02 NOTE — Assessment & Plan Note (Signed)
Encouraged healthy diet and exercise.  She is going to see general surgery to discuss weight loss surgery in the next several weeks.  Discussed that she really needs to have healthy diet and exercise practices in place as she goes into the surgery if it is an option.

## 2020-07-08 DIAGNOSIS — Z20822 Contact with and (suspected) exposure to covid-19: Secondary | ICD-10-CM | POA: Diagnosis not present

## 2020-07-16 ENCOUNTER — Other Ambulatory Visit: Payer: Self-pay | Admitting: Family Medicine

## 2020-07-20 ENCOUNTER — Ambulatory Visit (INDEPENDENT_AMBULATORY_CARE_PROVIDER_SITE_OTHER): Payer: BC Managed Care – PPO | Admitting: Family Medicine

## 2020-07-20 ENCOUNTER — Other Ambulatory Visit: Payer: Self-pay

## 2020-07-20 ENCOUNTER — Other Ambulatory Visit (INDEPENDENT_AMBULATORY_CARE_PROVIDER_SITE_OTHER): Payer: Self-pay | Admitting: Family Medicine

## 2020-07-20 ENCOUNTER — Encounter (INDEPENDENT_AMBULATORY_CARE_PROVIDER_SITE_OTHER): Payer: Self-pay | Admitting: Family Medicine

## 2020-07-20 VITALS — BP 126/80 | HR 85 | Temp 98.1°F | Ht 65.0 in | Wt 281.0 lb

## 2020-07-20 DIAGNOSIS — Z9189 Other specified personal risk factors, not elsewhere classified: Secondary | ICD-10-CM

## 2020-07-20 DIAGNOSIS — E1169 Type 2 diabetes mellitus with other specified complication: Secondary | ICD-10-CM

## 2020-07-20 DIAGNOSIS — R632 Polyphagia: Secondary | ICD-10-CM

## 2020-07-20 DIAGNOSIS — Z6841 Body Mass Index (BMI) 40.0 and over, adult: Secondary | ICD-10-CM

## 2020-07-20 DIAGNOSIS — E1159 Type 2 diabetes mellitus with other circulatory complications: Secondary | ICD-10-CM | POA: Diagnosis not present

## 2020-07-20 DIAGNOSIS — I152 Hypertension secondary to endocrine disorders: Secondary | ICD-10-CM

## 2020-07-21 MED ORDER — WEGOVY 2.4 MG/0.75ML ~~LOC~~ SOAJ: 2.4000 mg | SUBCUTANEOUS | 0 refills | Status: DC

## 2020-07-21 NOTE — Telephone Encounter (Signed)
Pt last seen by Dr. Wallace.  

## 2020-07-21 NOTE — Progress Notes (Signed)
Chief Complaint:   OBESITY Veronica Norton is here to discuss her progress with her obesity treatment plan along with follow-up of her obesity related diagnoses.   Today's visit was #: 23 Starting weight: 284 lbs Starting date: 02/03/2019 Today's weight: 281 lbs Today's date: 07/20/2020 Total lbs lost to date: 3 lbs Body mass index is 46.76 kg/m.  Total weight loss percentage to date: -1.06%    05/06/2020 06/07/2020  Weight 278 lb (126.1 kg) 281 lb (127.5 kg)  BMI (Calculated) 46.26 46.76  BLOOD PRESSURE - SYSTOLIC 144 315  BLOOD PRESSURE - DIASTOLIC 84 80  PULSE 91 92  Body Fat % 52.3 % 53.9 %  Muscle Mass (lbs) 126.2 lbs 123 lbs  Fat Mass (lbs) 145.6 lbs 151.4 lbs  Total Body Water (lbs) 95.2 lbs 100 lbs  Visceral Fat Rating  17 18   Interim History:  Chistina has had increased stress at work.  She is meeting with Dr. Redmond Pulling on Thursday at 2:15. Current Meal Plan: the Category 1 Plan for 80% of the time.  Current Exercise Plan: None. Current Anti-Obesity Medications: Wegovy 2.4 mg subcutaneously weekly. Side effects: None.  Assessment/Plan:   1. Polyphagia Current treatment: Wegovy 2.4 mg subcutaneously weekly. Polyphagia refers to excessive feelings of hunger. She will continue to focus on protein-rich, low simple carbohydrate foods. We reviewed the importance of hydration, regular exercise for stress reduction, and restorative sleep.  - Refill Semaglutide-Weight Management (WEGOVY) 2.4 MG/0.75ML SOAJ; Inject 2.4 mg into the skin once a week.  Dispense: 9 mL; Refill: 0  2. Type 2 diabetes mellitus with other specified complication, without long-term current use of insulin (HCC) Diabetes Mellitus: Not at goal. Medication: metformin 1,000 mg twice daily, Wegovy 2.4 mg subcutaneously weekly. Issues reviewed: blood sugar goals, complications of diabetes mellitus, hypoglycemia prevention and treatment, exercise, and nutrition.   Plan:  Continue medications. The patient will continue  to focus on protein-rich, low simple carbohydrate foods. We reviewed the importance of hydration, regular exercise for stress reduction, and restorative sleep.   Lab Results  Component Value Date   HGBA1C 7.6 (H) 02/27/2020   HGBA1C 7.3 (H) 11/26/2019   HGBA1C 7.3 (H) 08/18/2019   Lab Results  Component Value Date   MICROALBUR <0.7 02/27/2020   LDLCALC 56 05/05/2019   CREATININE 0.86 01/21/2020   3. Hypertension associated with type 2 diabetes mellitus ([place]) At goal. Medications: HCTZ 12.5 mg daily.   Plan: Avoid buying foods that are: processed, frozen, or prepackaged to avoid excess salt. We will watch for signs of hypotension as she continues lifestyle modifications.  BP Readings from Last 3 Encounters:  07/20/20 126/80  06/07/20 125/80  05/06/20 131/84   Lab Results  Component Value Date   CREATININE 0.86 01/21/2020   4. At risk for heart disease Due to Bhumi's current state of health and medical condition(s), she is at a higher risk for heart disease.  This puts the patient at much greater risk to subsequently develop cardiopulmonary conditions that can significantly affect patient's quality of life in a negative manner.    At least 8 minutes were spent on counseling Harmonie about these concerns today. Evidence-based interventions for health behavior change were utilized today including the discussion of self monitoring techniques, problem-solving barriers, and SMART goal setting techniques.  Specifically, regarding patient's less desirable eating habits and patterns, we employed the technique of small changes when Sheresa has not been able to fully commit to her prudent nutritional plan.  5. Obesity,  current BMI 46.8  Course: Vayla is currently in the action stage of change. As such, her goal is to continue with weight loss efforts.   Nutrition goals: She has agreed to the Category 1 Plan.   Exercise goals: For substantial health benefits, adults should do at least 150  minutes (2 hours and 30 minutes) a week of moderate-intensity, or 75 minutes (1 hour and 15 minutes) a week of vigorous-intensity aerobic physical activity, or an equivalent combination of moderate- and vigorous-intensity aerobic activity. Aerobic activity should be performed in episodes of at least 10 minutes, and preferably, it should be spread throughout the week.  Behavioral modification strategies: increasing lean protein intake, decreasing simple carbohydrates, increasing vegetables and increasing water intake.  Keisy has agreed to follow-up with our clinic in 4 weeks. She was informed of the importance of frequent follow-up visits to maximize her success with intensive lifestyle modifications for her multiple health conditions.   Objective:   Blood pressure 126/80, pulse 85, temperature 98.1 F (36.7 C), temperature source Oral, height 5\' 5"  (1.651 m), weight 281 lb (127.5 kg), last menstrual period 06/21/2020, SpO2 98 %. Body mass index is 46.76 kg/m.  General: Cooperative, alert, well developed, in no acute distress. HEENT: Conjunctivae and lids unremarkable. Cardiovascular: Regular rhythm.  Lungs: Normal work of breathing. Neurologic: No focal deficits.   Lab Results  Component Value Date   CREATININE 0.86 01/21/2020   BUN 14 01/21/2020   NA 135 01/21/2020   K 4.4 01/21/2020   CL 101 01/21/2020   CO2 28 01/21/2020   Lab Results  Component Value Date   ALT 17 01/21/2020   AST 15 01/21/2020   ALKPHOS 73 01/21/2020   BILITOT 0.3 01/21/2020   Lab Results  Component Value Date   HGBA1C 7.6 (H) 02/27/2020   HGBA1C 7.3 (H) 11/26/2019   HGBA1C 7.3 (H) 08/18/2019   HGBA1C 7.3 (H) 05/05/2019   HGBA1C 10.9 (H) 01/15/2019   Lab Results  Component Value Date   TSH 1.49 09/24/2019   Lab Results  Component Value Date   CHOL 118 05/05/2019   HDL 48 05/05/2019   LDLCALC 56 05/05/2019   LDLDIRECT 23.0 01/21/2020   TRIG 65 05/05/2019   CHOLHDL 2.5 05/05/2019   Lab  Results  Component Value Date   WBC 9.3 08/18/2019   HGB 15.2 08/18/2019   HCT 44.2 08/18/2019   MCV 96 08/18/2019   PLT 425 08/18/2019   Attestation Statements:   Reviewed by clinician on day of visit: allergies, medications, problem list, medical history, surgical history, family history, social history, and previous encounter notes.  I, Water quality scientist, CMA, am acting as transcriptionist for Briscoe Deutscher, DO  I have reviewed the above documentation for accuracy and completeness, and I agree with the above. Briscoe Deutscher, DO

## 2020-07-22 ENCOUNTER — Ambulatory Visit: Payer: BC Managed Care – PPO | Admitting: Pharmacist

## 2020-07-22 DIAGNOSIS — I152 Hypertension secondary to endocrine disorders: Secondary | ICD-10-CM

## 2020-07-22 DIAGNOSIS — E1169 Type 2 diabetes mellitus with other specified complication: Secondary | ICD-10-CM

## 2020-07-22 DIAGNOSIS — I10 Essential (primary) hypertension: Secondary | ICD-10-CM | POA: Diagnosis not present

## 2020-07-22 DIAGNOSIS — E785 Hyperlipidemia, unspecified: Secondary | ICD-10-CM

## 2020-07-22 DIAGNOSIS — E1159 Type 2 diabetes mellitus with other circulatory complications: Secondary | ICD-10-CM

## 2020-07-22 DIAGNOSIS — E559 Vitamin D deficiency, unspecified: Secondary | ICD-10-CM | POA: Diagnosis not present

## 2020-07-22 NOTE — Patient Instructions (Signed)
Visit Information  PATIENT GOALS: Goals Addressed              This Visit's Progress     Patient Stated   .  Medication Monitoring (pt-stated)        Patient Goals/Self-Care Activities . Over the next 90 days, patient will:  - take medications as prescribed check glucose periodically, document, and provide at future appointments target a minimum of 150 minutes of moderate intensity exercise weekly engage in dietary modifications by moderating portion sizes        Patient verbalizes understanding of instructions provided today and agrees to view in [place].   Plan: Telephone follow up appointment with care management team member scheduled for:  ~ 6 weeks  Catie Darnelle Maffucci, PharmD, [place], [place] Clinical Pharmacist [employer] at [employer] [phone]

## 2020-07-22 NOTE — Chronic Care Management (AMB) (Signed)
Care Management   Pharmacy Note  07/22/2020 Name: Veronica Norton MRN: [id number] DOB: 03-29-1975  Subjective: Veronica Norton is a 45 y.o. year old female who is a primary care patient of Caryl Bis Angela Adam, MD. The Care Management team was consulted for assistance with care management and care coordination needs.    Engaged with patient by telephone for follow up visit in response to provider referral for pharmacy case management and/or care coordination services.   The patient was given information about Care Management services today including:  1. Care Management services includes personalized support from designated clinical staff supervised by the patient's primary care provider, including individualized plan of care and coordination with other care providers. 2. 24/7 contact phone numbers for assistance for urgent and routine care needs. 3. The patient may stop case management services at any time by phone call to the office staff.  Patient agreed to services and consent obtained.  Assessment:  Review of patient status, including review of consultants reports, laboratory and other test data, was performed as part of comprehensive evaluation and provision of chronic care management services.   SDOH (Social Determinants of Health) assessments and interventions performed:    Objective:  Lab Results  Component Value Date   CREATININE 0.86 01/21/2020   CREATININE 0.73 08/18/2019   CREATININE 0.73 05/05/2019    Lab Results  Component Value Date   HGBA1C 7.6 (H) 02/27/2020       Component Value Date/Time   CHOL 118 05/05/2019 1241   TRIG 65 05/05/2019 1241   HDL 48 05/05/2019 1241   CHOLHDL 2.5 05/05/2019 1241   CHOLHDL 3 01/15/2019 1629   VLDL 23.0 01/15/2019 1629   LDLCALC 56 05/05/2019 1241   LDLDIRECT 23.0 01/21/2020 0845     Clinical ASCVD: No   BP Readings from Last 3 Encounters:  07/20/20 126/80  06/07/20 125/80  05/06/20 131/84    Care  Plan  No Known Allergies  Medications Reviewed Today    Reviewed by Karren Cobble, [place] (Certified Medical Assistant) on 07/20/20 at 63  Med List Status: <None>  Medication Order Taking? Sig Documenting Provider Last Dose Status Informant  clobetasol ointment (TEMOVATE) 0.05 % [id number]  Apply to stubborn areas of Eczema  For 2 weeks, then only on weekends. Avoid Face, groin, and under arms Moye, [place], MD  Active   Dapsone (ACZONE) 7.5 % GEL [id number]  Apply small amount once daily to affected areas [facility], [place], MD  Active   fluticasone (CUTIVATE) 0.05 % cream [id number]  APPLY TO AFFECTED AREA UP TO TWICE A DAY AS NEEDED FOR RASH/ITCHING Leone Haven, MD  Active   glucose blood (FREESTYLE TEST STRIPS) test strip [id number]  Check sugar bid Jackolyn Confer, MD  Active   glucose monitoring kit (FREESTYLE) monitoring kit [id number]  Use as directed to check sugars bid Jackolyn Confer, MD  Active   hydrochlorothiazide (MICROZIDE) 12.5 MG capsule [id number]  TAKE 1 CAPSULE BY MOUTH EVERY DAY Leone Haven, MD  Active   hydrocortisone 2.5 % cream [id number]  Apply topically 2 (two) times daily as needed (Rash). To face up to 2 weeks Laurence Ferrari, [place], MD  Active   Lancets [facility] ULTRASOFT) lancets [id number]  Use as instructed Jackolyn Confer, MD  Active   metFORMIN (GLUCOPHAGE-XR) 500 MG 24 hr tablet [id number]  TAKE 2 TABLETS BY MOUTH TWICE A DAY Leone Haven, MD  Active   norethindrone (CAMILA) 0.35 MG tablet [id number]  Take 1 tablet (  0.35 mg total) by mouth daily. Leone Haven, MD  Active   rosuvastatin (CRESTOR) 20 MG tablet [id number]  Take 1 tablet (20 mg total) by mouth daily. Leone Haven, MD  Active   Semaglutide-Weight Management [facility]) 2.4 MG/0.75ML Darden Palmer [id number]  Inject 2.4 mg into the skin once a week. Briscoe Deutscher, DO  Active   spironolactone (ALDACTONE) 100 MG tablet [id number]  TAKE 1 TABLET BY MOUTH EVERY DAY Brendolyn Patty, MD  Active            Patient Active Problem List   Diagnosis Date Noted  . Vitamin D deficiency 02/27/2020  . Hirsutism 01/23/2020  . Hyperlipidemia associated with type 2 diabetes mellitus ([place]) 01/23/2020  . Bilateral leg edema 09/24/2019  . Abnormal mammogram 01/15/2019  . GERD (gastroesophageal reflux disease) 05/26/2018  . Anxiety and depression 07/03/2017  . Fatigue due to depression 07/03/2017  . Eczema 12/15/2016  . Stress 08/15/2016  . Severe obesity (BMI >= 40) ([place]) 09/04/2014  . Diabetes mellitus type 2 in obese ([place]) 04/17/2011  . Hypertension associated with diabetes ([place]) 04/17/2011    Conditions to be addressed/monitored: HTN, HLD and DMII  Care Plan : Medication Management  Updates made by De Hollingshead, RPH-CPP since 07/22/2020 12:00 AM    Problem: Diabetes, Hypertension, Obesity     Long-Range Goal: Disease Progression Prevention   This Visit's Progress: On track  Recent Progress: On track  Priority: High  Note:   Current Barriers:  . Unable to achieve control of diabetes  . Unable to take medications regularly as prescribed  Pharmacist Clinical Goal(s):  Marland Kitchen Over the next 90 days, patient will achieve control of diabetes as evidenced by A1c through collaboration with PharmD and provider  Interventions: . 1:1 collaboration with Leone Haven, MD regarding development and update of comprehensive plan of care as evidenced by provider attestation and co-signature . Inter-disciplinary care team collaboration (see longitudinal plan of care) . Comprehensive medication review performed; medication list updated in electronic medical record  SDOH: . Trying to find a balance between work and life. Notes she is struggling right now. Got a promotion in October. Stressed. Discussed therapy. She will find the person that she worked with before and liked and see if they are in network  Diabetes: . Uncontrolled; current treatment: metformin XR 1000 mg BID, Wegovy 2.4 mg  weekly  . Current glucose readings: not checking. Overdue for A1c . Appointment with surgeon to discuss bariatric surgery this afternoon. No appreciable success on [facility] therapy.  . Praised for consideration of bariatric surgery.  . Due for lab work. Assisted in scheduling fasting lab appointment.   Hypertension: . Controlled; current treatment: HCTZ 12.5 mg daily, spironolactone 100 mg QAM (acne) . Home BP readings: not checking, but controlled at recent office visit . Recommended to continue current regimen  Hyperlipidemia: . Controlled; current treatment: rosuvastatin 20 mg daily . Due for lab work. Scheduled as above . Recommended to continue current regimen.  Contraception: . Appropriately managed; norethindrone 0.35 mg daily. Patient does confirm regular adherence to this medication today, though notes she would like to come off soon.  . Previously encouraged continued discussion with OBGYN regarding goals of therapy.   Patient Goals/Self-Care Activities . Over the next 90 days, patient will:  - take medications as prescribed check glucose periodically, document, and provide at future appointments target a minimum of 150 minutes of moderate intensity exercise weekly engage in dietary modifications by moderating portion sizes  Follow Up  Plan: Telephone follow up appointment with care management team member scheduled for:~ 6 weeks     Medication Assistance:  None required.  Patient affirms current coverage meets needs.  Follow Up:  Patient agrees to Care Plan and Follow-up.  Plan: Telephone follow up appointment with care management team member scheduled for:  ~ 6 weeks  Catie Darnelle Maffucci, PharmD, [place], [place] Clinical Pharmacist [employer] at [employer] [phone]

## 2020-07-28 ENCOUNTER — Other Ambulatory Visit (HOSPITAL_COMMUNITY): Payer: Self-pay | Admitting: General Surgery

## 2020-07-28 ENCOUNTER — Other Ambulatory Visit: Payer: Self-pay | Admitting: General Surgery

## 2020-08-06 ENCOUNTER — Other Ambulatory Visit: Payer: Self-pay

## 2020-08-06 ENCOUNTER — Other Ambulatory Visit (INDEPENDENT_AMBULATORY_CARE_PROVIDER_SITE_OTHER): Payer: BC Managed Care – PPO

## 2020-08-06 DIAGNOSIS — I152 Hypertension secondary to endocrine disorders: Secondary | ICD-10-CM

## 2020-08-06 DIAGNOSIS — E669 Obesity, unspecified: Secondary | ICD-10-CM | POA: Diagnosis not present

## 2020-08-06 DIAGNOSIS — E1169 Type 2 diabetes mellitus with other specified complication: Secondary | ICD-10-CM | POA: Diagnosis not present

## 2020-08-06 DIAGNOSIS — E1159 Type 2 diabetes mellitus with other circulatory complications: Secondary | ICD-10-CM | POA: Diagnosis not present

## 2020-08-06 LAB — COMPREHENSIVE METABOLIC PANEL
ALT: 19 U/L (ref 0–35)
AST: 10 U/L (ref 0–37)
Albumin: 3.6 g/dL (ref 3.5–5.2)
Alkaline Phosphatase: 85 U/L (ref 39–117)
BUN: 13 mg/dL (ref 6–23)
CO2: 26 mEq/L (ref 19–32)
Calcium: 8.5 mg/dL (ref 8.4–10.5)
Chloride: 98 mEq/L (ref 96–112)
Creatinine, Ser: 0.71 mg/dL (ref 0.40–1.20)
GFR: 103.55 mL/min (ref >60.00)
Glucose, Bld: 276 mg/dL — ABNORMAL HIGH (ref 70–99)
Potassium: 4.2 mEq/L (ref 3.5–5.1)
Sodium: 131 mEq/L — ABNORMAL LOW (ref 135–145)
Total Bilirubin: 0.4 mg/dL (ref 0.2–1.2)
Total Protein: 6.5 g/dL (ref 6.0–8.3)

## 2020-08-06 LAB — LIPID PANEL
Cholesterol: 82 mg/dL (ref 0–200)
HDL: 42.3 mg/dL (ref >39.00)
LDL Cholesterol: 25 mg/dL (ref 0–99)
NonHDL: 39.77
Total CHOL/HDL Ratio: 2
Triglycerides: 75 mg/dL (ref 0.0–149.0)
VLDL: 15 mg/dL (ref 0.0–40.0)

## 2020-08-06 LAB — HEMOGLOBIN A1C: Hgb A1c MFr Bld: 9.6 % — ABNORMAL HIGH (ref 4.6–6.5)

## 2020-08-10 ENCOUNTER — Encounter (INDEPENDENT_AMBULATORY_CARE_PROVIDER_SITE_OTHER): Payer: Self-pay | Admitting: Family Medicine

## 2020-08-10 ENCOUNTER — Ambulatory Visit (INDEPENDENT_AMBULATORY_CARE_PROVIDER_SITE_OTHER): Payer: BC Managed Care – PPO | Admitting: Family Medicine

## 2020-08-10 ENCOUNTER — Other Ambulatory Visit: Payer: Self-pay

## 2020-08-10 VITALS — BP 120/83 | HR 89 | Temp 98.1°F | Ht 65.0 in | Wt 282.0 lb

## 2020-08-10 DIAGNOSIS — Z9189 Other specified personal risk factors, not elsewhere classified: Secondary | ICD-10-CM | POA: Diagnosis not present

## 2020-08-10 DIAGNOSIS — E1169 Type 2 diabetes mellitus with other specified complication: Secondary | ICD-10-CM

## 2020-08-10 DIAGNOSIS — I152 Hypertension secondary to endocrine disorders: Secondary | ICD-10-CM

## 2020-08-10 DIAGNOSIS — E1159 Type 2 diabetes mellitus with other circulatory complications: Secondary | ICD-10-CM

## 2020-08-10 DIAGNOSIS — Z6841 Body Mass Index (BMI) 40.0 and over, adult: Secondary | ICD-10-CM

## 2020-08-10 DIAGNOSIS — E785 Hyperlipidemia, unspecified: Secondary | ICD-10-CM

## 2020-08-11 NOTE — Progress Notes (Signed)
Chief Complaint:   OBESITY Veronica Norton is here to discuss her progress with her obesity treatment plan along with follow-up of her obesity related diagnoses. See Medical Weight Management Flowsheet for bioelectrical impedance results.  Today's visit was #: 24 Starting weight: 284 lbs Starting date: 02/03/2019 Today's weight: 282 lbs Today's date: 08/10/2020 Weight change since last visit: +1 lb Total lbs lost to date: 2 lbs Body mass index is 46.93 kg/m.  Total weight loss percentage to date: -0.7%  Interim History:  Veronica Norton has been working through the bariatric surgery track.  Admits that she could be more compliant with medications and food intake (drinking sweet tea) and wants to improve. Nutrition Plan: the Category 1 Plan. Activity: None. Anti-obesity medications: Wegovy 2.4 mg subcutaneously weekly. Reported side effects: None.  Assessment/Plan:   1. Type 2 diabetes mellitus with other specified complication, without long-term current use of insulin (HCC) Diabetes Mellitus: Not at goal. Medication: metformin 1,000 mg twice daily, Wegovy 2.4 mg subcutaneously weekly. Issues reviewed: blood sugar goals, complications of diabetes mellitus, hypoglycemia prevention and treatment, exercise, and nutrition.   Plan:  Continue [id number]. The patient will continue to focus on protein-rich, low simple carbohydrate foods. We reviewed the importance of hydration, regular exercise for stress reduction, and restorative sleep.   Lab Results  Component Value Date   HGBA1C 9.6 (H) 08/06/2020   HGBA1C 7.6 (H) 02/27/2020   HGBA1C 7.3 (H) 11/26/2019   Lab Results  Component Value Date   MICROALBUR <0.7 02/27/2020   LDLCALC 25 08/06/2020   CREATININE 0.71 08/06/2020   2. Hypertension associated with type 2 diabetes mellitus ([place]) At goal. Medications: HCTZ 12.5 mg daily.   Plan: Avoid buying foods that are: processed, frozen, or prepackaged to avoid excess salt. We will watch for signs of  hypotension as she continues lifestyle modifications. We will continue to monitor closely alongside her PCP and/or Specialist.    BP Readings from Last 3 Encounters:  08/10/20 120/83  07/20/20 126/80  06/07/20 125/80   Lab Results  Component Value Date   CREATININE 0.71 08/06/2020   3. Hyperlipidemia associated with type 2 diabetes mellitus ([place]) Course: At goal. Lipid-lowering medications: Crestor 20 mg daily.   Plan: Dietary changes: Increase soluble fiber, decrease simple carbohydrates, decrease saturated fat. Exercise changes: Moderate to vigorous-intensity aerobic activity 150 minutes per week or as tolerated. We will continue to monitor along with PCP/specialists as it pertains to her weight loss journey.  Lab Results  Component Value Date   CHOL 82 08/06/2020   HDL 42.30 08/06/2020   LDLCALC 25 08/06/2020   LDLDIRECT 23.0 01/21/2020   TRIG 75.0 08/06/2020   CHOLHDL 2 08/06/2020   Lab Results  Component Value Date   ALT 19 08/06/2020   AST 10 08/06/2020   ALKPHOS 85 08/06/2020   BILITOT 0.4 08/06/2020   4. At risk for heart disease Due to Veronica Norton's current state of health and medical condition(s), she is at a higher risk for heart disease.  This puts the patient at much greater risk to subsequently develop cardiopulmonary conditions that can significantly affect patient's quality of life in a negative manner.    At least 9 minutes were spent on counseling Veronica Norton about these concerns today. Evidence-based interventions for health behavior change were utilized today including the discussion of self monitoring techniques, problem-solving barriers, and SMART goal setting techniques.  Specifically, regarding patient's less desirable eating habits and patterns, we employed the technique of small changes when Veronica Norton has  not been able to fully commit to her prudent nutritional plan.  5. Obesity, current BMI 47.1  Course: Veronica Norton is currently in the action stage of change. As such, her  goal is to continue with weight loss efforts.   Nutrition goals: She has agreed to the Category 1 Plan.   Exercise goals: For substantial health benefits, adults should do at least 150 minutes (2 hours and 30 minutes) a week of moderate-intensity, or 75 minutes (1 hour and 15 minutes) a week of vigorous-intensity aerobic physical activity, or an equivalent combination of moderate- and vigorous-intensity aerobic activity. Aerobic activity should be performed in episodes of at least 10 minutes, and preferably, it should be spread throughout the week.  Behavioral modification strategies: increasing lean protein intake, decreasing simple carbohydrates, increasing vegetables, increasing water intake, decreasing liquid calories, decreasing alcohol intake and decreasing sodium intake.  Veronica Norton has agreed to follow-up with our clinic in 4 weeks. She was informed of the importance of frequent follow-up visits to maximize her success with intensive lifestyle modifications for her multiple health conditions.   Objective:   Blood pressure 120/83, pulse 89, temperature 98.1 F (36.7 C), temperature source Oral, height 5\' 5"  (1.651 m), weight 282 lb (127.9 kg), SpO2 95 %. Body mass index is 46.93 kg/m.  General: Cooperative, alert, well developed, in no acute distress. HEENT: Conjunctivae and lids unremarkable. Cardiovascular: Regular rhythm.  Lungs: Normal work of breathing. Neurologic: No focal deficits.   Lab Results  Component Value Date   CREATININE 0.71 08/06/2020   BUN 13 08/06/2020   NA 131 (L) 08/06/2020   K 4.2 08/06/2020   CL 98 08/06/2020   CO2 26 08/06/2020   Lab Results  Component Value Date   ALT 19 08/06/2020   AST 10 08/06/2020   ALKPHOS 85 08/06/2020   BILITOT 0.4 08/06/2020   Lab Results  Component Value Date   HGBA1C 9.6 (H) 08/06/2020   HGBA1C 7.6 (H) 02/27/2020   HGBA1C 7.3 (H) 11/26/2019   HGBA1C 7.3 (H) 08/18/2019   HGBA1C 7.3 (H) 05/05/2019   Lab Results   Component Value Date   TSH 1.49 09/24/2019   Lab Results  Component Value Date   CHOL 82 08/06/2020   HDL 42.30 08/06/2020   LDLCALC 25 08/06/2020   LDLDIRECT 23.0 01/21/2020   TRIG 75.0 08/06/2020   CHOLHDL 2 08/06/2020   Lab Results  Component Value Date   WBC 9.3 08/18/2019   HGB 15.2 08/18/2019   HCT 44.2 08/18/2019   MCV 96 08/18/2019   PLT 425 08/18/2019   Attestation Statements:   Reviewed by clinician on day of visit: allergies, medications, problem list, medical history, surgical history, family history, social history, and previous encounter notes.  I, Water quality scientist, CMA, am acting as transcriptionist for Briscoe Deutscher, DO  I have reviewed the above documentation for accuracy and completeness, and I agree with the above. Briscoe Deutscher, DO

## 2020-08-12 ENCOUNTER — Other Ambulatory Visit: Payer: Self-pay

## 2020-08-12 ENCOUNTER — Ambulatory Visit (HOSPITAL_COMMUNITY)
Admission: RE | Admit: 2020-08-12 | Discharge: 2020-08-12 | Disposition: A | Discharge status: Home / Self Care | Payer: BC Managed Care – PPO | Source: Ambulatory Visit | Attending: General Surgery | Admitting: General Surgery

## 2020-08-12 DIAGNOSIS — Z01818 Encounter for other preprocedural examination: Secondary | ICD-10-CM | POA: Diagnosis not present

## 2020-08-12 DIAGNOSIS — K449 Diaphragmatic hernia without obstruction or gangrene: Secondary | ICD-10-CM | POA: Diagnosis not present

## 2020-08-31 ENCOUNTER — Ambulatory Visit: Payer: BC Managed Care – PPO | Admitting: Pharmacist

## 2020-08-31 DIAGNOSIS — E1159 Type 2 diabetes mellitus with other circulatory complications: Secondary | ICD-10-CM

## 2020-08-31 DIAGNOSIS — E1169 Type 2 diabetes mellitus with other specified complication: Secondary | ICD-10-CM

## 2020-08-31 DIAGNOSIS — E785 Hyperlipidemia, unspecified: Secondary | ICD-10-CM

## 2020-08-31 NOTE — Chronic Care Management (AMB) (Signed)
Care Management   Pharmacy Note  08/31/2020 Name: Veronica Norton MRN: [id number] DOB: [birth year]  Subjective: Veronica Norton is a 45 y.o. year old female who is a primary care patient of Caryl Bis Angela Adam, MD. The Care Management team was consulted for assistance with care management and care coordination needs.    Engaged with patient by telephone for follow up visit in response to provider referral for pharmacy case management and/or care coordination services.   The patient was given information about Care Management services today including:  Care Management services includes personalized support from designated clinical staff supervised by the patient's primary care provider, including individualized plan of care and coordination with other care providers. 24/7 contact phone numbers for assistance for urgent and routine care needs. The patient may stop case management services at any time by phone call to the office staff.  Patient agreed to services and consent obtained.  Assessment:  Review of patient status, including review of consultants reports, laboratory and other test data, was performed as part of comprehensive evaluation and provision of chronic care management services.   SDOH (Social Determinants of Health) assessments and interventions performed:  SDOH Interventions    Flowsheet Row Most Recent Value  SDOH Interventions   Financial Strain Interventions Intervention Not Indicated        Objective:  Lab Results  Component Value Date   CREATININE 0.71 08/06/2020   CREATININE 0.86 01/21/2020   CREATININE 0.73 08/18/2019    Lab Results  Component Value Date   HGBA1C 9.6 (H) 08/06/2020       Component Value Date/Time   CHOL 82 08/06/2020 1011   CHOL 118 05/05/2019 1241   TRIG 75.0 08/06/2020 1011   HDL 42.30 08/06/2020 1011   HDL 48 05/05/2019 1241   CHOLHDL 2 08/06/2020 1011   VLDL 15.0 08/06/2020 1011   LDLCALC 25 08/06/2020 1011    LDLCALC 56 05/05/2019 1241   LDLDIRECT 23.0 01/21/2020 0845     Clinical ASCVD: No  The ASCVD Risk score Mikey Bussing DC Jr., et al., 2013) failed to calculate for the following reasons:   The valid total cholesterol range is 130 to 320 mg/dL      BP Readings from Last 3 Encounters:  08/10/20 120/83  07/20/20 126/80  06/07/20 125/80    Care Plan  No Known Allergies  Medications Reviewed Today     Reviewed by De Hollingshead, RPH-CPP (Pharmacist) on 08/31/20 at 1516  Med List Status: <None>   Medication Order Taking? Sig Documenting Provider Last Dose Status Informant  clobetasol ointment (TEMOVATE) 0.05 % [id number]  Apply to stubborn areas of Eczema  For 2 weeks, then only on weekends. Avoid Face, groin, and under arms Moye, [place], MD  Active   Dapsone (ACZONE) 7.5 % GEL [id number]  Apply small amount once daily to affected areas [facility], [place], MD  Active   fluticasone (CUTIVATE) 0.05 % cream [id number]  APPLY TO AFFECTED AREA UP TO TWICE A DAY AS NEEDED FOR RASH/ITCHING Leone Haven, MD  Active   glucose blood (FREESTYLE TEST STRIPS) test strip [id number]  Check sugar bid Jackolyn Confer, MD  Active   glucose monitoring kit (FREESTYLE) monitoring kit [id number]  Use as directed to check sugars bid Jackolyn Confer, MD  Active   hydrochlorothiazide (MICROZIDE) 12.5 MG capsule [id number]  TAKE 1 CAPSULE BY MOUTH EVERY DAY Leone Haven, MD  Active   hydrocortisone 2.5 % cream [id number]  Apply topically 2 (two) times  daily as needed (Rash). To face up to 2 weeks Laurence Ferrari, [place], MD  Active   Lancets [facility] ULTRASOFT) lancets [id number]  Use as instructed Jackolyn Confer, MD  Active   metFORMIN (GLUCOPHAGE-XR) 500 MG 24 hr tablet [id number] Yes TAKE 2 TABLETS BY MOUTH TWICE A DAY Leone Haven, MD Taking Active            Med Note ([place] Aug 31, 2020  3:06 PM) Taking 2 tablets once daily  norethindrone (CAMILA) 0.35 MG tablet [id number]  Take 1  tablet (0.35 mg total) by mouth daily. Leone Haven, MD  Active   rosuvastatin (CRESTOR) 20 MG tablet [id number]  Take 1 tablet (20 mg total) by mouth daily. Leone Haven, MD  Active   Semaglutide-Weight Management [facility]) 2.4 MG/0.75ML Darden Palmer [id number] Yes Inject 2.4 mg into the skin once a week. Briscoe Deutscher, DO Taking Active   spironolactone (ALDACTONE) 100 MG tablet [id number]  TAKE 1 TABLET BY MOUTH EVERY DAY Brendolyn Patty, MD  Active             Patient Active Problem List   Diagnosis Date Noted   Vitamin D deficiency 02/27/2020   Hirsutism 01/23/2020   Hyperlipidemia associated with type 2 diabetes mellitus ([place]) 01/23/2020   Bilateral leg edema 09/24/2019   Abnormal mammogram 01/15/2019   GERD (gastroesophageal reflux disease) 05/26/2018   Anxiety and depression 07/03/2017   Fatigue due to depression 07/03/2017   Eczema 12/15/2016   Stress 08/15/2016   Severe obesity (BMI >= 40) ([place]) 09/04/2014   Diabetes mellitus type 2 in obese ([place]) 04/17/2011   Hypertension associated with diabetes ([place]) 04/17/2011    Conditions to be addressed/monitored: HTN and DMII  Care Plan : Medication Management  Updates made by De Hollingshead, RPH-CPP since 08/31/2020 12:00 AM     Problem: Diabetes, Hypertension, Obesity      Long-Range Goal: Disease Progression Prevention   This Visit's Progress: On track  Recent Progress: On track  Priority: High  Note:   Current Barriers:  Unable to achieve control of diabetes  Unable to take medications regularly as prescribed  Pharmacist Clinical Goal(s):  Over the next 90 days, patient will achieve control of diabetes as evidenced by A1c through collaboration with PharmD and provider  Interventions: 1:1 collaboration with Leone Haven, MD regarding development and update of comprehensive plan of care as evidenced by provider attestation and co-signature Inter-disciplinary care team collaboration (see longitudinal plan  of care) Comprehensive medication review performed; medication list updated in electronic medical record  SDOH: Trying to find a balance between work and life. Notes she is struggling right now. Got a promotion in October. Stressed. Discussed therapy. She will find the person that she worked with before and liked and see if they are in network  Diabetes: Uncontrolled; current treatment: metformin XR 1000 mg BID - reports that she has often been missing doses, only taking ~ noce daily, Wegovy 2.4 mg weekly  - reports missing doses over the past several months Current glucose readings: not checking.  Working through the steps of bariatric surgery evaluation. No appreciable success on [facility] therapy.  Discussed importance of adherence. Discussed potential benefit of using a pill box. Will provide with BID pill box when at the office on Friday.  Discussed use of CGM for motivation/accountability. Patient interested. Scheduled in-office appointment on Friday to teach on CGM placement.  Discussed surgical risks of elevated A1c and that her current level  of glucose control may preclude her from surgery. Reiterated importance of improved glycemic control. Moving forward, could consider switch from [facility] to [facility] if no benefit in weight/glycemic control with adherence to [facility] therapy. Would reiterate importance of contraceptive counseling.   Hypertension: Controlled per last office visit; current treatment: HCTZ 12.5 mg daily, spironolactone 100 mg QAM (acne) Previously recommended to continue current regimen  Hyperlipidemia: Controlled per last lab visit; current treatment: rosuvastatin 20 mg daily Previously recommended to continue current regimen.  Contraception: Appropriately managed; norethindrone 0.35 mg daily. Patient does confirm regular adherence to this medication today, though notes she would like to come off soon.  Previously encouraged continued discussion with OBGYN regarding  goals of therapy.   Patient Goals/Self-Care Activities Over the next 90 days, patient will:  - take medications as prescribed check glucose periodically, document, and provide at future appointments target a minimum of 150 minutes of moderate intensity exercise weekly engage in dietary modifications by moderating portion sizes  Follow Up Plan: Face to Face appointment with care management team member scheduled for: ~4 days     Medication Assistance:  None required.  Patient affirms current coverage meets needs.  Follow Up:  Patient agrees to Care Plan and Follow-up.  Plan: Face to Face appointment with care management team member scheduled for: ~ 4 days  Catie Darnelle Maffucci, PharmD, [place], [place] Clinical Pharmacist [employer] at [employer] [phone]

## 2020-08-31 NOTE — Patient Instructions (Signed)
Visit Information   Goals Addressed               This Visit's Progress     Patient Stated     Medication Monitoring (pt-stated)        Patient Goals/Self-Care Activities Over the next 90 days, patient will:  - take medications as prescribed check glucose periodically, document, and provide at future appointments target a minimum of 150 minutes of moderate intensity exercise weekly engage in dietary modifications by moderating portion sizes         Patient verbalizes understanding of instructions provided today and agrees to view in [place].    Plan: Face to Face appointment with care management team member scheduled for: ~ 4 days  Catie Darnelle Maffucci, PharmD, [place], [place] Clinical Pharmacist [employer] at [employer] [phone]

## 2020-09-03 ENCOUNTER — Ambulatory Visit: Payer: BC Managed Care – PPO | Admitting: Pharmacist

## 2020-09-03 ENCOUNTER — Other Ambulatory Visit: Payer: Self-pay

## 2020-09-03 DIAGNOSIS — E1169 Type 2 diabetes mellitus with other specified complication: Secondary | ICD-10-CM

## 2020-09-03 DIAGNOSIS — I152 Hypertension secondary to endocrine disorders: Secondary | ICD-10-CM

## 2020-09-03 DIAGNOSIS — E785 Hyperlipidemia, unspecified: Secondary | ICD-10-CM

## 2020-09-03 DIAGNOSIS — E1159 Type 2 diabetes mellitus with other circulatory complications: Secondary | ICD-10-CM

## 2020-09-03 NOTE — Patient Instructions (Addendum)
Visit Information   Goals Addressed               This Visit's Progress     Patient Stated     Medication Monitoring (pt-stated)        Patient Goals/Self-Care Activities Over the next 90 days, patient will:  - take medications as prescribed check glucose at least three times daily using CGM, document, and provide at future appointments target a minimum of 150 minutes of moderate intensity exercise weekly engage in dietary modifications by moderating portion sizes          Patient verbalizes understanding of instructions provided today and agrees to view in [place].   Plan: Telephone follow up appointment with care management team member scheduled for:  ~ 2 weeks  Catie Darnelle Maffucci, PharmD, [place], [place] Clinical Pharmacist [employer] at [employer] [phone]

## 2020-09-03 NOTE — Chronic Care Management (AMB) (Signed)
**Note De-Identified Veronica Obfuscation** Care Management   Pharmacy Note  09/03/2020 Name: Veronica Norton MRN: [id number] DOB: Jul 07, 1975  Subjective: Veronica Norton is a 45 y.o. year old female who is a primary care patient of Caryl Bis Angela Adam, MD. The Care Management team was consulted for assistance with care management and care coordination needs.    Engaged with patient face to face for follow up visit in response to provider referral for pharmacy case management and/or care coordination services.   The patient was given information about Care Management services today including:  Care Management services includes personalized support from designated clinical staff supervised by the patient's primary care provider, including individualized plan of care and coordination with other care providers. 24/7 contact phone numbers for assistance for urgent and routine care needs. The patient may stop case management services at any time by phone call to the office staff.  Patient agreed to services and consent obtained.  Assessment:  Review of patient status, including review of consultants reports, laboratory and other test data, was performed as part of comprehensive evaluation and provision of chronic care management services.   SDOH (Social Determinants of Health) assessments and interventions performed:  none today  Objective:  Lab Results  Component Value Date   CREATININE 0.71 08/06/2020   CREATININE 0.86 01/21/2020   CREATININE 0.73 08/18/2019    Lab Results  Component Value Date   HGBA1C 9.6 (H) 08/06/2020       Component Value Date/Time   CHOL 82 08/06/2020 1011   CHOL 118 05/05/2019 1241   TRIG 75.0 08/06/2020 1011   HDL 42.30 08/06/2020 1011   HDL 48 05/05/2019 1241   CHOLHDL 2 08/06/2020 1011   VLDL 15.0 08/06/2020 1011   LDLCALC 25 08/06/2020 1011   LDLCALC 56 05/05/2019 1241   LDLDIRECT 23.0 01/21/2020 0845     Clinical ASCVD: No  The ASCVD Risk score Mikey Bussing DC Jr., et al., 2013)  failed to calculate for the following reasons:   The valid total cholesterol range is 130 to 320 mg/dL     BP Readings from Last 3 Encounters:  08/10/20 120/83  07/20/20 126/80  06/07/20 125/80    Care Plan  No Known Allergies  Medications Reviewed Today     Reviewed by De Hollingshead, RPH-CPP (Pharmacist) on 08/31/20 at 1516  Med List Status: <None>   Medication Order Taking? Sig Documenting Provider Last Dose Status Informant  clobetasol ointment (TEMOVATE) 0.05 % [id number]  Apply to stubborn areas of Eczema  For 2 weeks, then only on weekends. Avoid Face, groin, and under arms Moye, [place], MD  Active   Dapsone (ACZONE) 7.5 % GEL [id number]  Apply small amount once daily to affected areas [facility], [place], MD  Active   fluticasone (CUTIVATE) 0.05 % cream [id number]  APPLY TO AFFECTED AREA UP TO TWICE A DAY AS NEEDED FOR RASH/ITCHING Leone Haven, MD  Active   glucose blood (FREESTYLE TEST STRIPS) test strip [id number]  Check sugar bid Jackolyn Confer, MD  Active   glucose monitoring kit (FREESTYLE) monitoring kit [id number]  Use as directed to check sugars bid Jackolyn Confer, MD  Active   hydrochlorothiazide (MICROZIDE) 12.5 MG capsule [id number]  TAKE 1 CAPSULE BY MOUTH EVERY DAY Leone Haven, MD  Active   hydrocortisone 2.5 % cream [id number]  Apply topically 2 (two) times daily as needed (Rash). To face up to 2 weeks Laurence Ferrari, [place], MD  Active   Lancets [facility] ULTRASOFT) lancets [id number]  Use as  instructed Jackolyn Confer, MD  Active   metFORMIN (GLUCOPHAGE-XR) 500 MG 24 hr tablet [id number] Yes TAKE 2 TABLETS BY MOUTH TWICE A DAY Leone Haven, MD Taking Active            Med Note ([place] Aug 31, 2020  3:06 PM) Taking 2 tablets once daily  norethindrone (CAMILA) 0.35 MG tablet [id number]  Take 1 tablet (0.35 mg total) by mouth daily. Leone Haven, MD  Active   rosuvastatin (CRESTOR) 20 MG tablet [id number]  Take 1 tablet (20 mg  total) by mouth daily. Leone Haven, MD  Active   Semaglutide-Weight Management [facility]) 2.4 MG/0.75ML Darden Palmer [id number] Yes Inject 2.4 mg into the skin once a week. Briscoe Deutscher, DO Taking Active   spironolactone (ALDACTONE) 100 MG tablet [id number]  TAKE 1 TABLET BY MOUTH EVERY DAY Brendolyn Patty, MD  Active             Patient Active Problem List   Diagnosis Date Noted   Vitamin D deficiency 02/27/2020   Hirsutism 01/23/2020   Hyperlipidemia associated with type 2 diabetes mellitus ([place]) 01/23/2020   Bilateral leg edema 09/24/2019   Abnormal mammogram 01/15/2019   GERD (gastroesophageal reflux disease) 05/26/2018   Anxiety and depression 07/03/2017   Fatigue due to depression 07/03/2017   Eczema 12/15/2016   Stress 08/15/2016   Severe obesity (BMI >= 40) ([place]) 09/04/2014   Diabetes mellitus type 2 in obese ([place]) 04/17/2011   Hypertension associated with diabetes ([place]) 04/17/2011    Conditions to be addressed/monitored: HTN, HLD, and DMII  Care Plan : Medication Management  Updates made by De Hollingshead, RPH-CPP since 09/03/2020 12:00 AM     Problem: Diabetes, Hypertension, Obesity      Long-Range Goal: Disease Progression Prevention   This Visit's Progress: On track  Recent Progress: On track  Priority: High  Note:   Current Barriers:  Unable to achieve control of diabetes  Unable to take medications regularly as prescribed  Pharmacist Clinical Goal(s):  Over the next 90 days, patient will achieve control of diabetes as evidenced by A1c through collaboration with PharmD and provider  Interventions: 1:1 collaboration with Leone Haven, MD regarding development and update of comprehensive plan of care as evidenced by provider attestation and co-signature Inter-disciplinary care team collaboration (see longitudinal plan of care) Comprehensive medication review performed; medication list updated in electronic medical record   Diabetes: Uncontrolled;  current treatment: metformin XR 1000 mg BID, Wegovy 2.4 mg weekly Current glucose readings: not checking. Discussed [place]. Presented for teaching today Working through the steps of bariatric surgery evaluation. No appreciable success on [facility] therapy.  Moving forward, could consider switch from [facility] to [facility] if no benefit in weight/glycemic control with adherence to [facility] therapy. Would reiterate importance of contraceptive counseling.  Provided education on use of CGM, including placement and use. Provided with sample for patient to try. She will reach out to me if she decides that she is interested in a prescription to her pharmacy.   Hypertension: Controlled per last office visit; current treatment: HCTZ 12.5 mg daily, spironolactone 100 mg QAM (acne) Previously recommended to continue current regimen  Hyperlipidemia: Controlled per last lab visit; current treatment: rosuvastatin 20 mg daily Previously recommended to continue current regimen.  Contraception: Appropriately managed; norethindrone 0.35 mg daily. Patient does confirm regular adherence to this medication today, though notes she would like to come off soon.  Previously encouraged continued discussion with OBGYN regarding  goals of therapy.   Patient Goals/Self-Care Activities Over the next 90 days, patient will:  - take medications as prescribed check glucose periodically, document, and provide at future appointments target a minimum of 150 minutes of moderate intensity exercise weekly engage in dietary modifications by moderating portion sizes  Follow Up Plan: Telephone follow up appointment with care management team member scheduled for: ~ 2 weeks     Medication Assistance:  None required.  Patient affirms current coverage meets needs.  Follow Up:  Patient agrees to Care Plan and Follow-up.  Plan: Telephone follow up appointment with care management team member scheduled for:  ~ 8 weeks  Catie Darnelle Maffucci, PharmD,  [place], [place] Clinical Pharmacist [employer] at [employer] [phone]

## 2020-09-07 ENCOUNTER — Ambulatory Visit (INDEPENDENT_AMBULATORY_CARE_PROVIDER_SITE_OTHER): Payer: BC Managed Care – PPO | Admitting: Family Medicine

## 2020-09-07 ENCOUNTER — Other Ambulatory Visit: Payer: Self-pay

## 2020-09-07 ENCOUNTER — Encounter (INDEPENDENT_AMBULATORY_CARE_PROVIDER_SITE_OTHER): Payer: Self-pay | Admitting: Family Medicine

## 2020-09-07 VITALS — BP 125/84 | HR 84 | Temp 98.1°F | Ht 65.0 in | Wt 281.0 lb

## 2020-09-07 DIAGNOSIS — E1169 Type 2 diabetes mellitus with other specified complication: Secondary | ICD-10-CM | POA: Diagnosis not present

## 2020-09-07 DIAGNOSIS — Z9189 Other specified personal risk factors, not elsewhere classified: Secondary | ICD-10-CM | POA: Diagnosis not present

## 2020-09-07 DIAGNOSIS — E669 Obesity, unspecified: Secondary | ICD-10-CM

## 2020-09-07 DIAGNOSIS — F3289 Other specified depressive episodes: Secondary | ICD-10-CM

## 2020-09-07 DIAGNOSIS — R632 Polyphagia: Secondary | ICD-10-CM | POA: Diagnosis not present

## 2020-09-07 DIAGNOSIS — Z6841 Body Mass Index (BMI) 40.0 and over, adult: Secondary | ICD-10-CM

## 2020-09-15 NOTE — Progress Notes (Signed)
Chief Complaint:   OBESITY Veronica Norton is here to discuss her progress with her obesity treatment plan along with follow-up of her obesity related diagnoses.   Today's visit was #: 25 Starting weight: 284 lbs Starting date: 02/03/2019 Today's weight: 281 lbs Today's date: 09/07/2020 Weight change since last visit: 1 lb Total lbs lost to date: 3 lbs Body mass index is 46.76 kg/m.  Total weight loss percentage to date: -1.06%  Interim History: Veronica Norton has a CGM, and this morning, her blood sugar was 180 and is 136 right now.  Average is 185.  She is taking Wegovy consistently.  She needs a Psychiatry assessment to complete the bariatric track.  Will send message to Pharm/CCM as she would like to continue CGM.  Current Meal Plan: the Category 1 Plan for 50% of the time.  Current Exercise Plan: Exercise videos for 30 minutes 3 times per week. Current Anti-Obesity Medications: Wegovy 2.4 mg subcutaneously weekly. Side effects: None.  Assessment/Plan:   1. Polyphagia At goal. Current treatment: Wegovy 2.4 mg subcutaneously weekly. Polyphagia refers to excessive feelings of hunger. She will continue to focus on protein-rich, low simple carbohydrate foods. We reviewed the importance of hydration, regular exercise for stress reduction, and restorative sleep.  2. Diabetes mellitus type 2 in obese ([name]) Diabetes Mellitus: Not at goal. Medication: Wegovy 2.4 mg subcutaneously weekly and metformin 1,000 mg twice daily. Issues reviewed: blood sugar goals, complications of diabetes mellitus, hypoglycemia prevention and treatment, exercise, and nutrition.   Plan: The patient will continue to focus on protein-rich, low simple carbohydrate foods. We reviewed the importance of hydration, regular exercise for stress reduction, and restorative sleep.   Lab Results  Component Value Date   HGBA1C 9.6 (H) 08/06/2020   HGBA1C 7.6 (H) 02/27/2020   HGBA1C 7.3 (H) 11/26/2019   Lab Results  Component Value  Date   MICROALBUR <0.7 02/27/2020   LDLCALC 25 08/06/2020   CREATININE 0.71 08/06/2020   3. Other depression, with emotional eating Controlled. Medication: None.  Plan:  Discussed cues and consequences, how thoughts affect eating, model of thoughts, feelings, and behaviors, and strategies for change by focusing on the cue. Discussed cognitive distortions, coping thoughts, and how to change your thoughts.  4. At risk for heart disease Due to Veronica Norton's current state of health and medical condition(s), she is at a higher risk for heart disease.  This puts the patient at much greater risk to subsequently develop cardiopulmonary conditions that can significantly affect patient's quality of life in a negative manner.    At least 8 minutes were spent on counseling Veronica Norton about these concerns today. Evidence-based interventions for health behavior change were utilized today including the discussion of self monitoring techniques, problem-solving barriers, and SMART goal setting techniques.  Specifically, regarding patient's less desirable eating habits and patterns, we employed the technique of small changes when Veronica Norton has not been able to fully commit to her prudent nutritional plan.  5. Obesity, current BMI 46.8  Course: [name] is currently in the action stage of change. As such, her goal is to continue with weight loss efforts.   Nutrition goals: She has agreed to the Category 1 Plan.   Exercise goals:  As is.  Behavioral modification strategies: increasing lean protein intake, decreasing simple carbohydrates, increasing vegetables, and increasing water intake.  Veronica Norton has agreed to follow-up with our clinic in 4 weeks. She was informed of the importance of frequent follow-up visits to maximize her success with intensive lifestyle modifications  for her multiple health conditions.   Objective:   Blood pressure 125/84, pulse 84, temperature 98.1 F (36.7 C), temperature source Oral, height 5\' 5"   (1.651 m), weight 281 lb (127.5 kg), SpO2 97 %. Body mass index is 46.76 kg/m.  General: Cooperative, alert, well developed, in no acute distress. HEENT: Conjunctivae and lids unremarkable. Cardiovascular: Regular rhythm.  Lungs: Normal work of breathing. Neurologic: No focal deficits.   Lab Results  Component Value Date   CREATININE 0.71 08/06/2020   BUN 13 08/06/2020   NA 131 (L) 08/06/2020   K 4.2 08/06/2020   CL 98 08/06/2020   CO2 26 08/06/2020   Lab Results  Component Value Date   ALT 19 08/06/2020   AST 10 08/06/2020   ALKPHOS 85 08/06/2020   BILITOT 0.4 08/06/2020   Lab Results  Component Value Date   HGBA1C 9.6 (H) 08/06/2020   HGBA1C 7.6 (H) 02/27/2020   HGBA1C 7.3 (H) 11/26/2019   HGBA1C 7.3 (H) 08/18/2019   HGBA1C 7.3 (H) 05/05/2019   Lab Results  Component Value Date   TSH 1.49 09/24/2019   Lab Results  Component Value Date   CHOL 82 08/06/2020   HDL 42.30 08/06/2020   LDLCALC 25 08/06/2020   LDLDIRECT 23.0 01/21/2020   TRIG 75.0 08/06/2020   CHOLHDL 2 08/06/2020   Lab Results  Component Value Date   VD25OH 22.62 (L) 05/07/2020   VD25OH 22.97 (L) 02/27/2020   VD25OH 40.6 05/05/2019   Lab Results  Component Value Date   WBC 9.3 08/18/2019   HGB 15.2 08/18/2019   HCT 44.2 08/18/2019   MCV 96 08/18/2019   PLT 425 08/18/2019   Attestation Statements:   Reviewed by clinician on day of visit: allergies, medications, problem list, medical history, surgical history, family history, social history, and previous encounter notes.  I, Water quality scientist, CMA, am acting as transcriptionist for Briscoe Deutscher, DO  I have reviewed the above documentation for accuracy and completeness, and I agree with the above. Briscoe Deutscher, DO

## 2020-09-17 ENCOUNTER — Other Ambulatory Visit: Payer: Self-pay

## 2020-09-17 ENCOUNTER — Ambulatory Visit: Payer: BC Managed Care – PPO | Admitting: Pharmacist

## 2020-09-17 DIAGNOSIS — E785 Hyperlipidemia, unspecified: Secondary | ICD-10-CM

## 2020-09-17 DIAGNOSIS — Z20822 Contact with and (suspected) exposure to covid-19: Secondary | ICD-10-CM | POA: Diagnosis not present

## 2020-09-17 DIAGNOSIS — E1159 Type 2 diabetes mellitus with other circulatory complications: Secondary | ICD-10-CM

## 2020-09-17 DIAGNOSIS — E1169 Type 2 diabetes mellitus with other specified complication: Secondary | ICD-10-CM

## 2020-09-17 DIAGNOSIS — E669 Obesity, unspecified: Secondary | ICD-10-CM

## 2020-09-17 MED ORDER — FREESTYLE LIBRE 2 SENSOR MISC: 11 refills | Status: DC

## 2020-09-17 NOTE — Patient Instructions (Signed)
Visit Information   Goals Addressed               This Visit's Progress     Patient Stated     Medication Monitoring (pt-stated)        Patient Goals/Self-Care Activities Over the next 90 days, patient will:  - take medications as prescribed check glucose at least three times daily using CGM, document, and provide at future appointments target a minimum of 150 minutes of moderate intensity exercise weekly engage in dietary modifications by moderating portion sizes         Patient verbalizes understanding of instructions provided today and agrees to view in [place].    Plan: Telephone follow up appointment with care management team member scheduled for:  ~ 12 weeks  Catie Darnelle Maffucci, PharmD, [place], [place] Clinical Pharmacist [employer] at [employer] [phone]

## 2020-09-17 NOTE — Chronic Care Management (AMB) (Signed)
Care Management   Pharmacy Note  09/17/2020 Name: Veronica Norton MRN: [id number] DOB: 1975/03/23  Subjective: Veronica Norton is a 45 y.o. year old female who is a primary care patient of Caryl Bis Angela Adam, MD. The Care Management team was consulted for assistance with care management and care coordination needs.    Engaged with patient face to face for follow up visit in response to provider referral for pharmacy case management and/or care coordination services.   The patient was given information about Care Management services today including:  Care Management services includes personalized support from designated clinical staff supervised by the patient's primary care provider, including individualized plan of care and coordination with other care providers. 24/7 contact phone numbers for assistance for urgent and routine care needs. The patient may stop case management services at any time by phone call to the office staff.  Patient agreed to services and consent obtained.  Assessment:  Review of patient status, including review of consultants reports, laboratory and other test data, was performed as part of comprehensive evaluation and provision of chronic care management services.   SDOH (Social Determinants of Health) assessments and interventions performed:  SDOH Interventions    Flowsheet Row Most Recent Value  SDOH Interventions   Financial Strain Interventions Intervention Not Indicated        Objective:  Lab Results  Component Value Date   CREATININE 0.71 08/06/2020   CREATININE 0.86 01/21/2020   CREATININE 0.73 08/18/2019    Lab Results  Component Value Date   HGBA1C 9.6 (H) 08/06/2020       Component Value Date/Time   CHOL 82 08/06/2020 1011   CHOL 118 05/05/2019 1241   TRIG 75.0 08/06/2020 1011   HDL 42.30 08/06/2020 1011   HDL 48 05/05/2019 1241   CHOLHDL 2 08/06/2020 1011   VLDL 15.0 08/06/2020 1011   LDLCALC 25 08/06/2020 1011    LDLCALC 56 05/05/2019 1241   LDLDIRECT 23.0 01/21/2020 0845    Clinical ASCVD: No  The ASCVD Risk score Mikey Bussing DC Jr., et al., 2013) failed to calculate for the following reasons:   The valid total cholesterol range is 130 to 320 mg/dL     BP Readings from Last 3 Encounters:  09/07/20 125/84  08/10/20 120/83  07/20/20 126/80    Care Plan  No Known Allergies  Medications Reviewed Today     Reviewed by De Hollingshead, RPH-CPP (Pharmacist) on 09/17/20 at 1352  Med List Status: <None>   Medication Order Taking? Sig Documenting Provider Last Dose Status Informant  clobetasol ointment (TEMOVATE) 0.05 % [id number]  Apply to stubborn areas of Eczema  For 2 weeks, then only on weekends. Avoid Face, groin, and under arms Moye, [place], MD  Active   Continuous Blood Gluc Sensor (FREESTYLE LIBRE 2 SENSOR) [place] [id number] Yes Use to check glucose at least TID Leone Haven, MD  Active   Dapsone (ACZONE) 7.5 % GEL [id number]  Apply small amount once daily to affected areas [facility], [place], MD  Active   fluticasone (CUTIVATE) 0.05 % cream [id number]  APPLY TO AFFECTED AREA UP TO TWICE A DAY AS NEEDED FOR RASH/ITCHING Leone Haven, MD  Active   glucose blood (FREESTYLE TEST STRIPS) test strip [id number]  Check sugar bid Jackolyn Confer, MD  Active   glucose monitoring kit (FREESTYLE) monitoring kit [id number]  Use as directed to check sugars bid Jackolyn Confer, MD  Active   hydrochlorothiazide (MICROZIDE) 12.5 MG capsule [id number]  TAKE  1 CAPSULE BY MOUTH EVERY DAY Leone Haven, MD  Active   hydrocortisone 2.5 % cream [id number]  Apply topically 2 (two) times daily as needed (Rash). To face up to 2 weeks Laurence Ferrari, [place], MD  Active   Lancets [facility] ULTRASOFT) lancets [id number]  Use as instructed Jackolyn Confer, MD  Active   metFORMIN (GLUCOPHAGE-XR) 500 MG 24 hr tablet [id number] Yes TAKE 2 TABLETS BY MOUTH TWICE A DAY Leone Haven, MD Taking Active            Med  Note Darnelle Maffucci, [place] Lime   Fri Sep 17, 2020  1:41 PM)    norethindrone (CAMILA) 0.35 MG tablet [id number]  Take 1 tablet (0.35 mg total) by mouth daily. Leone Haven, MD  Active   rosuvastatin (CRESTOR) 20 MG tablet [id number]  Take 1 tablet (20 mg total) by mouth daily. Leone Haven, MD  Active   Semaglutide-Weight Management [facility]) 2.4 MG/0.75ML Darden Palmer [id number] Yes Inject 2.4 mg into the skin once a week. Briscoe Deutscher, DO Taking Active   spironolactone (ALDACTONE) 100 MG tablet [id number]  TAKE 1 TABLET BY MOUTH EVERY DAY Brendolyn Patty, MD  Active             Patient Active Problem List   Diagnosis Date Noted   Vitamin D deficiency 02/27/2020   Hirsutism 01/23/2020   Hyperlipidemia associated with type 2 diabetes mellitus ([place]) 01/23/2020   Bilateral leg edema 09/24/2019   Abnormal mammogram 01/15/2019   GERD (gastroesophageal reflux disease) 05/26/2018   Anxiety and depression 07/03/2017   Fatigue due to depression 07/03/2017   Eczema 12/15/2016   Stress 08/15/2016   Severe obesity (BMI >= 40) ([place]) 09/04/2014   Diabetes mellitus type 2 in obese ([place]) 04/17/2011   Hypertension associated with diabetes ([place]) 04/17/2011    Conditions to be addressed/monitored: HTN, HLD, and DMII  Care Plan : Medication Management  Updates made by De Hollingshead, RPH-CPP since 09/17/2020 12:00 AM     Problem: Diabetes, Hypertension, Obesity      Long-Range Goal: Disease Progression Prevention   This Visit's Progress: On track  Recent Progress: On track  Priority: High  Note:   Current Barriers:  Unable to achieve control of diabetes  Unable to take medications regularly as prescribed  Pharmacist Clinical Goal(s):  Over the next 90 days, patient will achieve control of diabetes as evidenced by A1c through collaboration with PharmD and provider  Interventions: 1:1 collaboration with Leone Haven, MD regarding development and update of comprehensive plan of care as  evidenced by provider attestation and co-signature Inter-disciplinary care team collaboration (see longitudinal plan of care) Comprehensive medication review performed; medication list updated in electronic medical record  Diabetes: Uncontrolled; current treatment: metformin XR 1000 mg BID, Wegovy 2.4 mg weekly Hx Jardiance - stopped due to vaginal itching Current glucose readings: Libre 2 sample Date of Download: 6/25-09/17/20 % Time CGM is active: 69% Average Glucose: 172 mg/dL Glucose Management Indicator: 7.4%  Glucose Variability: 20.3 (goal <36%) Time in Goal:  - Time in range 70-180: 64% - Time above range: 36% - Time below range: 0% Observed patterns: elevated throughout the day Reviewed CGM data. Discussed that additional lowering throughout the day would be beneficial. Discussed transition to [facility] from [facility] given superior A1c reduction, weight loss (though compared to Ozempic 1 mg in clinical trials). Encouraged to discuss with weight management team.  Recommended to continue current regimen. Script sent for [place] 2 sensor to pharmacy. Will follow  for need or PA.   Hypertension: Controlled per last office visit; current treatment: HCTZ 12.5 mg daily, spironolactone 100 mg QAM (acne) Previously recommended to continue current regimen  Hyperlipidemia: Controlled per last lab visit; current treatment: rosuvastatin 20 mg daily Previously recommended to continue current regimen.  Contraception: Appropriately managed; norethindrone 0.35 mg daily.  Previously encouraged continued discussion with OBGYN regarding goals of therapy.   Patient Goals/Self-Care Activities Over the next 90 days, patient will:  - take medications as prescribed check glucose periodically, document, and provide at future appointments target a minimum of 150 minutes of moderate intensity exercise weekly engage in dietary modifications by moderating portion sizes  Follow Up Plan: Face to Face  appointment with care management team member scheduled for:  ~12 weeks     Medication Assistance:  None required.  Patient affirms current coverage meets needs.  Follow Up:  Patient agrees to Care Plan and Follow-up.  Plan: Telephone follow up appointment with care management team member scheduled for:  ~ 12 weeks  Catie Darnelle Maffucci, PharmD, [place], [place] Clinical Pharmacist [employer] at [employer] [phone]

## 2020-09-20 ENCOUNTER — Telehealth: Payer: Self-pay | Admitting: Pharmacist

## 2020-09-20 ENCOUNTER — Telehealth: Payer: BC Managed Care – PPO

## 2020-09-20 NOTE — Telephone Encounter (Signed)
  Chronic Care Management   Note  09/20/2020 Name: Tambra Muller MRN: [id number] DOB: 05-Nov-1975   Attempted to contact patient to discuss medication change [facility]. Left voicemail for her to return my call at her convenience.   Catie Darnelle Maffucci, PharmD, [place], [place] Clinical Pharmacist [employer] at [place]

## 2020-09-21 ENCOUNTER — Ambulatory Visit: Payer: BC Managed Care – PPO | Admitting: Pharmacist

## 2020-09-21 DIAGNOSIS — E785 Hyperlipidemia, unspecified: Secondary | ICD-10-CM

## 2020-09-21 DIAGNOSIS — E1159 Type 2 diabetes mellitus with other circulatory complications: Secondary | ICD-10-CM

## 2020-09-21 DIAGNOSIS — I152 Hypertension secondary to endocrine disorders: Secondary | ICD-10-CM

## 2020-09-21 DIAGNOSIS — E1169 Type 2 diabetes mellitus with other specified complication: Secondary | ICD-10-CM

## 2020-09-21 DIAGNOSIS — E119 Type 2 diabetes mellitus without complications: Secondary | ICD-10-CM

## 2020-09-21 NOTE — Telephone Encounter (Signed)
Spoke with patient. See CCM documentation

## 2020-09-21 NOTE — Chronic Care Management (AMB) (Signed)
Care Management   Pharmacy Note  09/21/2020 Name: Veronica Norton MRN: [id number] DOB: Jul 02, 1975  Subjective: Veronica Norton is a 45 y.o. year old female who is a primary care patient of Caryl Bis Angela Adam, MD. The Care Management team was consulted for assistance with care management and care coordination needs.    Engaged with patient by telephone for follow up visit in response to provider referral for pharmacy case management and/or care coordination services.   The patient was given information about Care Management services today including:  Care Management services includes personalized support from designated clinical staff supervised by the patient's primary care provider, including individualized plan of care and coordination with other care providers. 24/7 contact phone numbers for assistance for urgent and routine care needs. The patient may stop case management services at any time by phone call to the office staff.  Patient agreed to services and consent obtained.  Assessment:  Review of patient status, including review of consultants reports, laboratory and other test data, was performed as part of comprehensive evaluation and provision of chronic care management services.   SDOH (Social Determinants of Health) assessments and interventions performed:    Objective:  Lab Results  Component Value Date   CREATININE 0.71 08/06/2020   CREATININE 0.86 01/21/2020   CREATININE 0.73 08/18/2019    Lab Results  Component Value Date   HGBA1C 9.6 (H) 08/06/2020       Component Value Date/Time   CHOL 82 08/06/2020 1011   CHOL 118 05/05/2019 1241   TRIG 75.0 08/06/2020 1011   HDL 42.30 08/06/2020 1011   HDL 48 05/05/2019 1241   CHOLHDL 2 08/06/2020 1011   VLDL 15.0 08/06/2020 1011   LDLCALC 25 08/06/2020 1011   LDLCALC 56 05/05/2019 1241   LDLDIRECT 23.0 01/21/2020 0845     Clinical ASCVD: No  The ASCVD Risk score Mikey Bussing DC Jr., et al., 2013) failed to  calculate for the following reasons:   The valid total cholesterol range is 130 to 320 mg/dL     BP Readings from Last 3 Encounters:  09/07/20 125/84  08/10/20 120/83  07/20/20 126/80    Care Plan  No Known Allergies  Medications Reviewed Today     Reviewed by De Hollingshead, RPH-CPP (Pharmacist) on 09/17/20 at 1352  Med List Status: <None>   Medication Order Taking? Sig Documenting Provider Last Dose Status Informant  clobetasol ointment (TEMOVATE) 0.05 % [id number]  Apply to stubborn areas of Eczema  For 2 weeks, then only on weekends. Avoid Face, groin, and under arms Moye, [place], MD  Active   Continuous Blood Gluc Sensor (FREESTYLE LIBRE 2 SENSOR) [place] [id number] Yes Use to check glucose at least TID Leone Haven, MD  Active   Dapsone (ACZONE) 7.5 % GEL [id number]  Apply small amount once daily to affected areas [facility], [place], MD  Active   fluticasone (CUTIVATE) 0.05 % cream [id number]  APPLY TO AFFECTED AREA UP TO TWICE A DAY AS NEEDED FOR RASH/ITCHING Leone Haven, MD  Active   glucose blood (FREESTYLE TEST STRIPS) test strip [id number]  Check sugar bid Jackolyn Confer, MD  Active   glucose monitoring kit (FREESTYLE) monitoring kit [id number]  Use as directed to check sugars bid Jackolyn Confer, MD  Active   hydrochlorothiazide (MICROZIDE) 12.5 MG capsule [id number]  TAKE 1 CAPSULE BY MOUTH EVERY DAY Leone Haven, MD  Active   hydrocortisone 2.5 % cream [id number]  Apply topically 2 (two) times daily  as needed (Rash). To face up to 2 weeks Laurence Ferrari, [place], MD  Active   Lancets [facility] ULTRASOFT) lancets [id number]  Use as instructed Jackolyn Confer, MD  Active   metFORMIN (GLUCOPHAGE-XR) 500 MG 24 hr tablet [id number] Yes TAKE 2 TABLETS BY MOUTH TWICE A DAY Leone Haven, MD Taking Active            Med Note Darnelle Maffucci, Ar[place] Lime   Fri Sep 17, 2020  1:41 PM)    norethindrone (CAMILA) 0.35 MG tablet [id number]  Take 1 tablet (0.35 mg total) by mouth  daily. Leone Haven, MD  Active   rosuvastatin (CRESTOR) 20 MG tablet [id number]  Take 1 tablet (20 mg total) by mouth daily. Leone Haven, MD  Active   Semaglutide-Weight Management [facility]) 2.4 MG/0.75ML Darden Palmer [id number] Yes Inject 2.4 mg into the skin once a week. Briscoe Deutscher, DO Taking Active   spironolactone (ALDACTONE) 100 MG tablet [id number]  TAKE 1 TABLET BY MOUTH EVERY DAY Brendolyn Patty, MD  Active             Patient Active Problem List   Diagnosis Date Noted   Vitamin D deficiency 02/27/2020   Hirsutism 01/23/2020   Hyperlipidemia associated with type 2 diabetes mellitus ([place]) 01/23/2020   Bilateral leg edema 09/24/2019   Abnormal mammogram 01/15/2019   GERD (gastroesophageal reflux disease) 05/26/2018   Anxiety and depression 07/03/2017   Fatigue due to depression 07/03/2017   Eczema 12/15/2016   Stress 08/15/2016   Severe obesity (BMI >= 40) ([place]) 09/04/2014   Diabetes mellitus type 2 in obese ([place]) 04/17/2011   Hypertension associated with diabetes ([place]) 04/17/2011    Conditions to be addressed/monitored: HTN, HLD, and DMII  Care Plan : Medication Management  Updates made by De Hollingshead, RPH-CPP since 09/21/2020 12:00 AM     Problem: Diabetes, Hypertension, Obesity      Long-Range Goal: Disease Progression Prevention   This Visit's Progress: On track  Recent Progress: On track  Priority: High  Note:   Current Barriers:  Unable to achieve control of diabetes  Unable to take medications regularly as prescribed  Pharmacist Clinical Goal(s):  Over the next 90 days, patient will achieve control of diabetes as evidenced by A1c through collaboration with PharmD and provider  Interventions: 1:1 collaboration with Leone Haven, MD regarding development and update of comprehensive plan of care as evidenced by provider attestation and co-signature Inter-disciplinary care team collaboration (see longitudinal plan of care) Comprehensive  medication review performed; medication list updated in electronic medical record  Diabetes: Uncontrolled; current treatment: metformin XR 1000 mg BID, Wegovy 2.4 mg weekly Hx Jardiance - stopped due to vaginal itching Discussed Mounjaro with Dr. Juleen China. She was supportive of switch for this patient. Contacted patient, discussed. Counseled that given significant impact on gastric emptying and slowed absorption of oral contraceptives when first starting and for 4 weeks after starting or dose increases, she would need to use a back up form of birth control during that time. She requests to continue her current regimen and think about it. Encouraged to discuss w/ Dr. Juleen China at next appointment later this month.  Hypertension: Controlled per last office visit; current treatment: HCTZ 12.5 mg daily, spironolactone 100 mg QAM (acne) Previously recommended to continue current regimen  Hyperlipidemia: Controlled per last lab visit; current treatment: rosuvastatin 20 mg daily Previously recommended to continue current regimen.  Contraception: Appropriately managed; norethindrone 0.35 mg daily.  Previously encouraged continued discussion with OBGYN regarding  goals of therapy.   Patient Goals/Self-Care Activities Over the next 90 days, patient will:  - take medications as prescribed check glucose periodically, document, and provide at future appointments target a minimum of 150 minutes of moderate intensity exercise weekly engage in dietary modifications by moderating portion sizes  Follow Up Plan: Face to Face appointment with care management team member scheduled for:  ~12 weeks     Medication Assistance:  None required.  Patient affirms current coverage meets needs.  Follow Up:  Patient agrees to Care Plan and Follow-up.  Plan: Telephone follow up appointment with care management team member scheduled for:  ~12 weeks as previously scheduled  Catie Darnelle Maffucci, PharmD, [place], [place] Clinical  Pharmacist [employer] at [facility] [phone]

## 2020-09-21 NOTE — Patient Instructions (Signed)
Visit Information   Goals Addressed               This Visit's Progress     Patient Stated     Medication Monitoring (pt-stated)        Patient Goals/Self-Care Activities Over the next 90 days, patient will:  - take medications as prescribed check glucose at least three times daily using CGM, document, and provide at future appointments target a minimum of 150 minutes of moderate intensity exercise weekly engage in dietary modifications by moderating portion sizes          Patient verbalizes understanding of instructions provided today and agrees to view in [place].   Plan: Telephone follow up appointment with care management team member scheduled for:  ~12 weeks as previously scheduled  Catie Darnelle Maffucci, PharmD, [place], [place] Clinical Pharmacist [employer] at [facility] [phone]

## 2020-10-04 DIAGNOSIS — F5089 Other specified eating disorder: Secondary | ICD-10-CM | POA: Diagnosis not present

## 2020-10-06 ENCOUNTER — Encounter: Payer: BC Managed Care – PPO | Attending: General Surgery | Admitting: Skilled Nursing Facility1

## 2020-10-06 ENCOUNTER — Encounter: Payer: Self-pay | Admitting: Skilled Nursing Facility1

## 2020-10-06 ENCOUNTER — Encounter (INDEPENDENT_AMBULATORY_CARE_PROVIDER_SITE_OTHER): Payer: Self-pay | Admitting: Family Medicine

## 2020-10-06 ENCOUNTER — Ambulatory Visit (INDEPENDENT_AMBULATORY_CARE_PROVIDER_SITE_OTHER): Payer: BC Managed Care – PPO | Admitting: Family Medicine

## 2020-10-06 ENCOUNTER — Other Ambulatory Visit: Payer: Self-pay

## 2020-10-06 VITALS — BP 107/74 | HR 84 | Temp 98.2°F | Ht 65.0 in | Wt 280.0 lb

## 2020-10-06 DIAGNOSIS — E785 Hyperlipidemia, unspecified: Secondary | ICD-10-CM

## 2020-10-06 DIAGNOSIS — F3289 Other specified depressive episodes: Secondary | ICD-10-CM

## 2020-10-06 DIAGNOSIS — E669 Obesity, unspecified: Secondary | ICD-10-CM | POA: Diagnosis not present

## 2020-10-06 DIAGNOSIS — E1169 Type 2 diabetes mellitus with other specified complication: Secondary | ICD-10-CM

## 2020-10-06 DIAGNOSIS — Z6841 Body Mass Index (BMI) 40.0 and over, adult: Secondary | ICD-10-CM

## 2020-10-06 DIAGNOSIS — R632 Polyphagia: Secondary | ICD-10-CM | POA: Diagnosis not present

## 2020-10-06 DIAGNOSIS — Z9189 Other specified personal risk factors, not elsewhere classified: Secondary | ICD-10-CM

## 2020-10-06 NOTE — Progress Notes (Addendum)
Nutrition Assessment for Bariatric Surgery Medical Nutrition Therapy Appt Start Time: 2:07  End Time: 3:10  Patient was seen on 10/06/2020 for Pre-Operative Nutrition Assessment. Letter of approval faxed to [facility] bariatric surgery program coordinator on 10/06/2020.   Referral stated Supervised Weight Loss (SWL) visits needed: 0  Not cleared at this time:  Pt to follow up for minimum of one more visit to assist pt with progressing through stages of change/further nutrition education. RD advised pt that this follow up visit is not mandated through insurance. Pt verbalized agreement.  Planned surgery: sleeve gastrectomy  Pt expectation of surgery: to lose weight and have a baby Pt expectation of dietitian: none identified     NUTRITION ASSESSMENT   Anthropometrics  Start weight at [facility]: 284.6 lbs (date: 10/06/2020)  Height: 65 in BMI: 47.36 kg/m2     Clinical  Medical hx: DM, HTN Medications: wegovy, see list  Labs: A1C 9.6, vitamin D 22.62 Notable signs/symptoms:  Any previous deficiencies? No  Micronutrient Nutrition Focused Physical Exam: Hair: No issues observed Eyes: No issues observed Mouth: No issues observed Neck: No issues observed Nails: No issues observed Skin: No issues observed  Lifestyle & Dietary Hx  Pt states she has tried the continuous glucose monitor so will be getting that soon.  Pt states she does not check her blood sugar currently stating she will be getting the freestyle soon stating she may have had a bit of diabetes burnout.  Pt states she is trying to start an exercise routine 2-3 times a week.  Pt states 75% of her meals are eaten out.  Pt states she was modeling her meal timing after her boyfriends who works third shift realizing she does not need to be eating that late.  Pt was discouraged to hear pregnancy is ill-advised for a minimum 18 months following surgery.   Pt states she is having trouble fathoming the amount of  changes.   Pt states with her new position at work she has had a time figurnig out her new role and being able to pull away for home.   24-Hr Dietary Recall First Meal: skipped or bagel thin + cream cheese + 4 [place] sausages + cup of peaches Snack: kind bar Second Meal 3pm:  restaurant leftovers + tossed salad  Snack:  Third Meal: taco bell Snack: cookie + ice cream Beverages: sparkling water, diet juice, water + flavoring   Estimated Energy Needs Calories: 1500   NUTRITION DIAGNOSIS  Overweight/obesity ([place]-3.3) related to past poor dietary habits and physical inactivity as evidenced by patient w/ planned sleeve gastrectomy surgery following dietary guidelines for continued weight loss.    NUTRITION INTERVENTION  Nutrition counseling (C-1) and education (E-2) to facilitate bariatric surgery goals.  Educated pt on micronutrient deficiencies post surgery and strategies to mitigate that risk   Pre-Op Goals Reviewed with the Patient Track food and beverage intake (pen and paper, MyFitness Pal, Baritastic app, etc.) Make healthy food choices while monitoring portion sizes: make more meals from home  Consume 3 meals per day or try to eat every 3-5 hours Avoid concentrated sugars and fried foods Keep sugar & fat in the single digits per serving on food labels Practice CHEWING your food (aim for applesauce consistency) Practice not drinking 15 minutes before, during, and 30 minutes after each meal and snack Avoid all carbonated beverages (ex: soda, sparkling beverages)  Limit caffeinated beverages (ex: coffee, tea, energy drinks) Avoid all sugar-sweetened beverages (ex: regular soda, sports drinks)  Avoid alcohol  Aim for 64-100 ounces of FLUID daily (with at least half of fluid intake being plain water)  Aim for at least 60-80 grams of PROTEIN daily Look for a liquid protein source that contains ?15 g protein and ?5 g carbohydrate (ex: shakes, drinks, shots) Make a list of non-food  related activities Physical activity is an important part of a healthy lifestyle so keep it moving! The goal is to reach 150 minutes of exercise per week, including cardiovascular and weight baring activity.  *Goals that are bolded indicate the pt would like to start working towards these  Handouts Provided Include  Bariatric Surgery handouts (Nutrition Visits, Pre-Op Goals, Protein Shakes, Vitamins & Minerals)  Learning Style & Readiness for Change Teaching method utilized: Visual & Auditory  Demonstrated degree of understanding via: Teach Back  Readiness Level: pre-contemplative  Barriers to learning/adherence to lifestyle change: work schedule   RD's Notes for Next Visit Assess pts adherence to chosen goals     MONITORING & EVALUATION Dietary intake, weekly physical activity, body weight, and pre-op goals reached at next nutrition visit.    Next Steps  Patient is to follow up at [facility] for Pre-Op Class >2 weeks before surgery for further nutrition education.  Return for next nutrition appt

## 2020-10-07 ENCOUNTER — Other Ambulatory Visit (INDEPENDENT_AMBULATORY_CARE_PROVIDER_SITE_OTHER): Payer: Self-pay | Admitting: Family Medicine

## 2020-10-07 DIAGNOSIS — E1169 Type 2 diabetes mellitus with other specified complication: Secondary | ICD-10-CM

## 2020-10-07 MED ORDER — WEGOVY 2.4 MG/0.75ML ~~LOC~~ SOAJ
2.4000 mg | SUBCUTANEOUS | 0 refills | Status: DC
Start: 2020-10-07 — End: 2020-11-08

## 2020-10-07 NOTE — Telephone Encounter (Signed)
Pt last seen by Dr. Wallace.  

## 2020-10-07 NOTE — Progress Notes (Signed)
Chief Complaint:   OBESITY Veronica Norton is here to discuss her progress with her obesity treatment plan along with follow-up of her obesity related diagnoses. See Medical Weight Management Flowsheet for complete bioelectrical impedance results.  Today's visit was #: 75 Starting weight: 284 lbs Starting date: 02/03/2019 Today's weight: 280 lbs Today's date: 10/06/2020 Weight change since last visit: 1 lb Total lbs lost to date: 4 lbs Body mass index is 46.59 kg/m.  Total weight loss percentage to date: -1.41%  Interim History:  Veronica Norton is working through the bariatric track.  She would like to become pregnant when it is safe after her weight loss. Nutrition Plan: the Category 1 Plan 50% of the time. Activity:  Increased walking. Anti-obesity medications: Wegovy 2.4 mg subcutaneously weekly. Reported side effects: None.  Assessment/Plan:   1. Diabetes mellitus type 2 in obese (HCC) Diabetes Mellitus: Not at goal. Medication: Wegovy 2.4 mg subcutaneously weekly, metformin 1,000 mg twice daily.   Plan:  Continue medications.  Will refill Wegovy today. The patient will continue to focus on protein-rich, low simple carbohydrate foods. We reviewed the importance of hydration, regular exercise for stress reduction, and restorative sleep.   Lab Results  Component Value Date   HGBA1C 9.6 (H) 08/06/2020   HGBA1C 7.6 (H) 02/27/2020   HGBA1C 7.3 (H) 11/26/2019   Lab Results  Component Value Date   MICROALBUR <0.7 02/27/2020   LDLCALC 25 08/06/2020   CREATININE 0.71 08/06/2020   - Refill Semaglutide-Weight Management (WEGOVY) 2.4 MG/0.75ML SOAJ; Inject 2.4 mg into the skin once a week.  Dispense: 9 mL; Refill: 0  2. Hyperlipidemia associated with type 2 diabetes mellitus ([place]) Course: At goal. Lipid-lowering medications: Crestor 20 mg daily.   Plan: Dietary changes: Increase soluble fiber, decrease simple carbohydrates, decrease saturated fat. Exercise changes: Moderate to  vigorous-intensity aerobic activity 150 minutes per week or as tolerated. We will continue to monitor along with PCP/specialists as it pertains to her weight loss journey.  Lab Results  Component Value Date   CHOL 82 08/06/2020   HDL 42.30 08/06/2020   LDLCALC 25 08/06/2020   LDLDIRECT 23.0 01/21/2020   TRIG 75.0 08/06/2020   CHOLHDL 2 08/06/2020   Lab Results  Component Value Date   ALT 19 08/06/2020   AST 10 08/06/2020   ALKPHOS 85 08/06/2020   BILITOT 0.4 08/06/2020   3. Polyphagia Controlled. Current treatment: Wegovy 2.4 mg subcutaneously weekly. Polyphagia refers to excessive feelings of hunger. She will continue to focus on protein-rich, low simple carbohydrate foods. We reviewed the importance of hydration, regular exercise for stress reduction, and restorative sleep.  4. Other depression, with emotional eating Controlled. Medication: None.  Plan:  Discussed cues and consequences, how thoughts affect eating, model of thoughts, feelings, and behaviors, and strategies for change by focusing on the cue. Discussed cognitive distortions, coping thoughts, and how to change your thoughts.  5. At risk for heart disease Due to Veronica Norton's current state of health and medical condition(s), she is at a higher risk for heart disease.  This puts the patient at much greater risk to subsequently develop cardiopulmonary conditions that can significantly affect patient's quality of life in a negative manner.    At least 8 minutes were spent on counseling Veronica Norton about these concerns today. Evidence-based interventions for health behavior change were utilized today including the discussion of self monitoring techniques, problem-solving barriers, and SMART goal setting techniques.  Specifically, regarding patient's less desirable eating habits and patterns, we employed the  technique of small changes when Veronica Norton has not been able to fully commit to her prudent nutritional plan.  6. Obesity, current BMI  46.7  Course: Veronica Norton is currently in the action stage of change. As such, her goal is to continue with weight loss efforts.   Nutrition goals: She has agreed to the Category 1 Plan.   Exercise goals:  As is.  Behavioral modification strategies: increasing lean protein intake, decreasing simple carbohydrates, increasing vegetables, increasing water intake, and decreasing liquid calories.  Veronica Norton has agreed to follow-up with our clinic in 4 weeks. She was informed of the importance of frequent follow-up visits to maximize her success with intensive lifestyle modifications for her multiple health conditions.   Objective:   Blood pressure 107/74, pulse 84, temperature 98.2 F (36.8 C), temperature source Oral, height '5\' 5"'$  (1.651 m), weight 280 lb (127 kg), SpO2 99 %. Body mass index is 46.59 kg/m.  General: Cooperative, alert, well developed, in no acute distress. HEENT: Conjunctivae and lids unremarkable. Cardiovascular: Regular rhythm.  Lungs: Normal work of breathing. Neurologic: No focal deficits.   Lab Results  Component Value Date   CREATININE 0.71 08/06/2020   BUN 13 08/06/2020   NA 131 (L) 08/06/2020   K 4.2 08/06/2020   CL 98 08/06/2020   CO2 26 08/06/2020   Lab Results  Component Value Date   ALT 19 08/06/2020   AST 10 08/06/2020   ALKPHOS 85 08/06/2020   BILITOT 0.4 08/06/2020   Lab Results  Component Value Date   HGBA1C 9.6 (H) 08/06/2020   HGBA1C 7.6 (H) 02/27/2020   HGBA1C 7.3 (H) 11/26/2019   HGBA1C 7.3 (H) 08/18/2019   HGBA1C 7.3 (H) 05/05/2019   Lab Results  Component Value Date   TSH 1.49 09/24/2019   Lab Results  Component Value Date   CHOL 82 08/06/2020   HDL 42.30 08/06/2020   LDLCALC 25 08/06/2020   LDLDIRECT 23.0 01/21/2020   TRIG 75.0 08/06/2020   CHOLHDL 2 08/06/2020   Lab Results  Component Value Date   VD25OH 22.62 (L) 05/07/2020   VD25OH 22.97 (L) 02/27/2020   VD25OH 40.6 05/05/2019   Lab Results  Component Value Date   WBC  9.3 08/18/2019   HGB 15.2 08/18/2019   HCT 44.2 08/18/2019   MCV 96 08/18/2019   PLT 425 08/18/2019   Attestation Statements:   Reviewed by clinician on day of visit: allergies, medications, problem list, medical history, surgical history, family history, social history, and previous encounter notes.  I, Water quality scientist, CMA, am acting as transcriptionist for Briscoe Deutscher, DO  I have reviewed the above documentation for accuracy and completeness, and I agree with the above. Briscoe Deutscher, DO

## 2020-10-08 DIAGNOSIS — Z20822 Contact with and (suspected) exposure to covid-19: Secondary | ICD-10-CM | POA: Diagnosis not present

## 2020-10-11 ENCOUNTER — Telehealth (INDEPENDENT_AMBULATORY_CARE_PROVIDER_SITE_OTHER): Payer: Self-pay

## 2020-10-11 NOTE — Telephone Encounter (Signed)
[employer] called about  a refill on , the wegovy 2.4 mg continuation of coverage the phone number is [phone] and  Fax [phone]

## 2020-10-11 NOTE — Telephone Encounter (Signed)
Prior authorization has been started, waiting on response.  

## 2020-10-11 NOTE — Telephone Encounter (Signed)
PA for Mancel Parsons has been started, waiting on insurance reply

## 2020-10-14 ENCOUNTER — Encounter: Payer: BC Managed Care – PPO | Admitting: Skilled Nursing Facility1

## 2020-10-27 LAB — HM DIABETES EYE EXAM

## 2020-10-29 NOTE — Progress Notes (Deleted)
A PA was approved for Saxenda for 09/15/2020-01/18/2021. Pharmacy was notified and patient has picked up the medication.  Undrea Archbold,cma

## 2020-11-03 ENCOUNTER — Ambulatory Visit (INDEPENDENT_AMBULATORY_CARE_PROVIDER_SITE_OTHER): Payer: BC Managed Care – PPO | Admitting: Family Medicine

## 2020-11-08 ENCOUNTER — Ambulatory Visit (INDEPENDENT_AMBULATORY_CARE_PROVIDER_SITE_OTHER): Payer: BC Managed Care – PPO | Admitting: Family Medicine

## 2020-11-08 ENCOUNTER — Encounter (INDEPENDENT_AMBULATORY_CARE_PROVIDER_SITE_OTHER): Payer: Self-pay | Admitting: Family Medicine

## 2020-11-08 ENCOUNTER — Other Ambulatory Visit: Payer: Self-pay

## 2020-11-08 VITALS — BP 111/75 | HR 92 | Temp 97.5°F | Ht 65.0 in | Wt 276.0 lb

## 2020-11-08 DIAGNOSIS — Z6841 Body Mass Index (BMI) 40.0 and over, adult: Secondary | ICD-10-CM | POA: Diagnosis not present

## 2020-11-08 DIAGNOSIS — E1169 Type 2 diabetes mellitus with other specified complication: Secondary | ICD-10-CM | POA: Diagnosis not present

## 2020-11-08 MED ORDER — WEGOVY 2.4 MG/0.75ML ~~LOC~~ SOAJ: 2.4000 mg | SUBCUTANEOUS | 0 refills | Status: DC

## 2020-11-08 NOTE — Progress Notes (Signed)
Chief Complaint:   OBESITY Veronica Norton is here to discuss her progress with her obesity treatment plan along with follow-up of her obesity related diagnoses. Veronica Norton is on the Category 1 Plan and states she is following her eating plan approximately 75% of the time. Veronica Norton states she is walking for 30 minutes 2 times per week.  Today's visit was #: 67 Starting weight: 284 lbs Starting date: 02/03/2019 Today's weight: 276 lbs Today's date: 11/08/2020 Total lbs lost to date: 8 Total lbs lost since last in-office visit: 4  Interim History: Veronica Norton has done better with weight loss. She has increased activity. Her hunger is controlled. She is considering having weight loss surgery later this year or next year. She has seen Dr. Redmond Pulling at [facility]. She has seen the Registered dietician and the Psychologist as well. She has 1 more Registered dietician appointment and then she should be scheduled for surgery.  Subjective:   1. Type 2 diabetes mellitus with other specified complication, without long-term current use of insulin (HCC) Veronica Norton is stable on metformin and Wegovy, and she denies signs of hypoglycemia. Last A1c was 9.6. She is considering having weight loss surgery. She is considering using her CGM again.  Assessment/Plan:   1. Type 2 diabetes mellitus with other specified complication, without long-term current use of insulin (HCC) Veronica Norton was encouraged to check her BGs and monitor her progress. She is due for labs so she will try to be fasting for her next visit with Dr. Juleen China. Good blood sugar control is important to decrease the likelihood of diabetic complications such as nephropathy, neuropathy, limb loss, blindness, coronary artery disease, and death. Intensive lifestyle modification including diet, exercise and weight loss are the first line of treatment for diabetes.   2. Obesity whit current BMI 46.1 Veronica Norton is currently in the action stage of change. As such, her goal is  to continue with weight loss efforts. She has agreed to the Category 1 Plan.   We discussed various medication options to help Veronica Norton with her weight loss efforts and we both agreed to continue Wegovy, and we will refill for 1 month.  - Semaglutide-Weight Management (WEGOVY) 2.4 MG/0.75ML SOAJ; Inject 2.4 mg into the skin once a week.  Dispense: 9 mL; Refill: 0  Exercise goals: As is.  Behavioral modification strategies: no skipping meals.  Talley has agreed to follow-up with our clinic in 4 weeks. She was informed of the importance of frequent follow-up visits to maximize her success with intensive lifestyle modifications for her multiple health conditions.   Objective:   Blood pressure 111/75, pulse 92, temperature (!) 97.5 F (36.4 C), height '5\' 5"'$  (1.651 m), weight 276 lb (125.2 kg), SpO2 100 %. Body mass index is 45.93 kg/m.  General: Cooperative, alert, well developed, in no acute distress. HEENT: Conjunctivae and lids unremarkable. Cardiovascular: Regular rhythm.  Lungs: Normal work of breathing. Neurologic: No focal deficits.   Lab Results  Component Value Date   CREATININE 0.71 08/06/2020   BUN 13 08/06/2020   NA 131 (L) 08/06/2020   K 4.2 08/06/2020   CL 98 08/06/2020   CO2 26 08/06/2020   Lab Results  Component Value Date   ALT 19 08/06/2020   AST 10 08/06/2020   ALKPHOS 85 08/06/2020   BILITOT 0.4 08/06/2020   Lab Results  Component Value Date   HGBA1C 9.6 (H) 08/06/2020   HGBA1C 7.6 (H) 02/27/2020   HGBA1C 7.3 (H) 11/26/2019   HGBA1C 7.3 (H)  08/18/2019   HGBA1C 7.3 (H) 05/05/2019   No results found for: INSULIN Lab Results  Component Value Date   TSH 1.49 09/24/2019   Lab Results  Component Value Date   CHOL 82 08/06/2020   HDL 42.30 08/06/2020   LDLCALC 25 08/06/2020   LDLDIRECT 23.0 01/21/2020   TRIG 75.0 08/06/2020   CHOLHDL 2 08/06/2020   Lab Results  Component Value Date   VD25OH 22.62 (L) 05/07/2020   VD25OH 22.97 (L) 02/27/2020    VD25OH 40.6 05/05/2019   Lab Results  Component Value Date   WBC 9.3 08/18/2019   HGB 15.2 08/18/2019   HCT 44.2 08/18/2019   MCV 96 08/18/2019   PLT 425 08/18/2019   No results found for: IRON, TIBC, FERRITIN  Attestation Statements:   Reviewed by clinician on day of visit: allergies, medications, problem list, medical history, surgical history, family history, social history, and previous encounter notes.  Time spent on visit including pre-visit chart review and post-visit care and charting was 40 minutes.    I, Trixie Dredge, am acting as transcriptionist for Dennard Nip, MD.  I have reviewed the above documentation for accuracy and completeness, and I agree with the above. -  Dennard Nip, MD

## 2020-11-11 ENCOUNTER — Ambulatory Visit: Payer: BC Managed Care – PPO | Admitting: Skilled Nursing Facility1

## 2020-11-24 ENCOUNTER — Ambulatory Visit: Payer: BC Managed Care – PPO | Admitting: Pharmacist

## 2020-11-24 ENCOUNTER — Ambulatory Visit: Payer: BC Managed Care – PPO | Admitting: Family Medicine

## 2020-11-24 ENCOUNTER — Other Ambulatory Visit: Payer: Self-pay

## 2020-11-24 ENCOUNTER — Encounter: Payer: Self-pay | Admitting: Family Medicine

## 2020-11-24 VITALS — BP 130/80 | HR 98 | Temp 98.2°F | Ht 65.0 in | Wt 279.6 lb

## 2020-11-24 DIAGNOSIS — Z23 Encounter for immunization: Secondary | ICD-10-CM

## 2020-11-24 DIAGNOSIS — E1169 Type 2 diabetes mellitus with other specified complication: Secondary | ICD-10-CM

## 2020-11-24 DIAGNOSIS — E1159 Type 2 diabetes mellitus with other circulatory complications: Secondary | ICD-10-CM | POA: Diagnosis not present

## 2020-11-24 DIAGNOSIS — M2140 Flat foot [pes planus] (acquired), unspecified foot: Secondary | ICD-10-CM | POA: Insufficient documentation

## 2020-11-24 DIAGNOSIS — E785 Hyperlipidemia, unspecified: Secondary | ICD-10-CM

## 2020-11-24 DIAGNOSIS — I152 Hypertension secondary to endocrine disorders: Secondary | ICD-10-CM | POA: Diagnosis not present

## 2020-11-24 DIAGNOSIS — M2141 Flat foot [pes planus] (acquired), right foot: Secondary | ICD-10-CM | POA: Diagnosis not present

## 2020-11-24 DIAGNOSIS — E669 Obesity, unspecified: Secondary | ICD-10-CM

## 2020-11-24 DIAGNOSIS — M2142 Flat foot [pes planus] (acquired), left foot: Secondary | ICD-10-CM

## 2020-11-24 LAB — POCT GLYCOSYLATED HEMOGLOBIN (HGB A1C): Hemoglobin A1C: 8.9 % — AB (ref 4.0–5.6)

## 2020-11-24 NOTE — Assessment & Plan Note (Signed)
She will continue to see her various specialist for this.

## 2020-11-24 NOTE — Patient Instructions (Signed)
Visit Information   Goals Addressed               This Visit's Progress     Patient Stated     Medication Monitoring (pt-stated)        Patient Goals/Self-Care Activities Over the next 90 days, patient will:  - take medications as prescribed check glucose at least three times daily using CGM, document, and provide at future appointments target a minimum of 150 minutes of moderate intensity exercise weekly engage in dietary modifications by moderating portion sizes        Patient verbalizes understanding of instructions provided today and agrees to view in [place].   Plan: Telephone follow up appointment with care management team member scheduled for:  ~ 6 weeks  Catie Darnelle Maffucci, PharmD, [place], [place] Clinical Pharmacist [employer] [facility] [employer] [phone]

## 2020-11-24 NOTE — Progress Notes (Signed)
Tommi Rumps, MD Phone: [phone]  Veronica Norton is a 45 y.o. female who presents today for f/u.  DIABETES Disease Monitoring: Blood Sugar ranges-not checking Polyuria/phagia/dipsia- some polydipsia      Optho- UTD Medications: Compliance- taking wegovy, metformin Hypoglycemic symptoms- no  HYPERTENSION Disease Monitoring Home BP Monitoring not checking Chest pain- no    Dyspnea- no Medications Compliance-  taking spironolactone, HCTZ.   Edema- no BMET    Component Value Date/Time   NA 131 (L) 08/06/2020 1011   NA 137 08/18/2019 1712   K 4.2 08/06/2020 1011   CL 98 08/06/2020 1011   CO2 26 08/06/2020 1011   GLUCOSE 276 (H) 08/06/2020 1011   BUN 13 08/06/2020 1011   BUN 13 08/18/2019 1712   CREATININE 0.71 08/06/2020 1011   CREATININE 0.67 02/06/2018 1506   CALCIUM 8.5 08/06/2020 1011   GFRNONAA 101 08/18/2019 1712   GFRAA 117 08/18/2019 1712   Obesity: Patient has increased her walking 2-3 times a week.  She is meal prepping.  She is planning on bariatric surgery.  Pes planus: Chronic issue.  No discomfort.   Social History   Tobacco Use  Smoking Status Never  Smokeless Tobacco Never    Current Outpatient Medications on File Prior to Visit  Medication Sig Dispense Refill   clobetasol ointment (TEMOVATE) 0.05 % Apply to stubborn areas of Eczema  For 2 weeks, then only on weekends. Avoid Face, groin, and under arms 45 g 3   Continuous Blood Gluc Sensor (FREESTYLE LIBRE 2 SENSOR) MISC Use to check glucose at least TID 2 each 11   Dapsone (ACZONE) 7.5 % GEL Apply small amount once daily to affected areas 60 g 2   fluticasone (CUTIVATE) 0.05 % cream APPLY TO AFFECTED AREA UP TO TWICE A DAY AS NEEDED FOR RASH/ITCHING 60 g 0   glucose blood (FREESTYLE TEST STRIPS) test strip Check sugar bid 100 each 5   glucose monitoring kit (FREESTYLE) monitoring kit Use as directed to check sugars bid 1 each 0   hydrochlorothiazide (MICROZIDE) 12.5 MG capsule TAKE 1  CAPSULE BY MOUTH EVERY DAY 90 capsule 1   hydrocortisone 2.5 % cream Apply topically 2 (two) times daily as needed (Rash). To face up to 2 weeks 30 g 1   Lancets (ONETOUCH ULTRASOFT) lancets Use as instructed 100 each 5   metFORMIN (GLUCOPHAGE-XR) 500 MG 24 hr tablet TAKE 2 TABLETS BY MOUTH TWICE A DAY 360 tablet 1   norethindrone (CAMILA) 0.35 MG tablet Take 1 tablet (0.35 mg total) by mouth daily. 28 tablet 6   rosuvastatin (CRESTOR) 20 MG tablet Take 1 tablet (20 mg total) by mouth daily. 90 tablet 3   Semaglutide-Weight Management (WEGOVY) 2.4 MG/0.75ML SOAJ Inject 2.4 mg into the skin once a week. 9 mL 0   spironolactone (ALDACTONE) 100 MG tablet TAKE 1 TABLET BY MOUTH EVERY DAY 90 tablet 0   No current facility-administered medications on file prior to visit.     ROS see history of present illness  Objective  Physical Exam Vitals:   11/24/20 1456 11/24/20 1510  BP: 140/80 130/80  Pulse: 98   Temp: 98.2 F (36.8 C)   SpO2: 96%     BP Readings from Last 3 Encounters:  11/24/20 130/80  11/08/20 111/75  10/06/20 107/74   Wt Readings from Last 3 Encounters:  11/24/20 279 lb 9.6 oz (126.8 kg)  11/08/20 276 lb (125.2 kg)  10/06/20 280 lb (127 kg)    Physical Exam  Constitutional:      General: She is not in acute distress.    Appearance: She is not diaphoretic.  Cardiovascular:     Rate and Rhythm: Normal rate and regular rhythm.     Heart sounds: Normal heart sounds.  Pulmonary:     Effort: Pulmonary effort is normal.     Breath sounds: Normal breath sounds.  Musculoskeletal:     Right lower leg: No edema.     Left lower leg: No edema.  Skin:    General: Skin is warm and dry.  Neurological:     Mental Status: She is alert.   Diabetic Foot Exam - Simple   Simple Foot Form Diabetic Foot exam was performed with the following findings: Yes 11/24/2020  3:08 PM  Visual Inspection See comments: Yes Sensation Testing Intact to touch and monofilament testing  bilaterally: Yes Pulse Check Posterior Tibialis and Dorsalis pulse intact bilaterally: Yes Comments Pes planus, otherwise no deformities, ulcerations, or skin breakdown      Assessment/Plan: Please see individual problem list.  Problem List Items Addressed This Visit     Diabetes mellitus type 2 in obese (HCC)    Check A1c.  Continue Wegovy and metformin.      Relevant Orders   POCT HgB A1C (Completed)   Hypertension associated with diabetes ([place])    Improved on recheck.  She will continue spironolactone 100 mg daily and HCTZ 12.5 mg daily.  Check BMP.      Relevant Orders   Basic Metabolic Panel (BMET)   Pes planus    Asymptomatic.  Discussed that she may want to see somebody for custom orthotics in the future.      Severe obesity (BMI >= 40) (HCC)    She will continue to see her various specialist for this.      Other Visit Diagnoses     Need for immunization against influenza    -  Primary   Relevant Orders   Flu Vaccine QUAD 67moIM (Fluarix, Fluzone & Alfiuria Quad PF) (Completed)      Return in about 3 months (around 02/23/2021).  This visit occurred during the SARS-CoV-2 public health emergency.  Safety protocols were in place, including screening questions prior to the visit, additional usage of staff PPE, and extensive cleaning of exam room while observing appropriate contact time as indicated for disinfecting solutions.    ETommi Rumps MD [place]

## 2020-11-24 NOTE — Assessment & Plan Note (Signed)
Improved on recheck.  She will continue spironolactone 100 mg daily and HCTZ 12.5 mg daily.  Check BMP.

## 2020-11-24 NOTE — Chronic Care Management (AMB) (Signed)
Care Management   Pharmacy Note  11/24/2020 Name: Veronica Norton MRN: [id number] DOB: 1975/11/26  Subjective: Veronica Norton is a 45 y.o. year old female who is a primary care patient of Caryl Bis Angela Adam, MD. The Care Management team was consulted for assistance with care management and care coordination needs.    Engaged with patient face to face for follow up visit in response to provider referral for pharmacy case management and/or care coordination services.   The patient was given information about Care Management services today including:  Care Management services includes personalized support from designated clinical staff supervised by the patient's primary care provider, including individualized plan of care and coordination with other care providers. 24/7 contact phone numbers for assistance for urgent and routine care needs. The patient may stop case management services at any time by phone call to the office staff.  Patient agreed to services and consent obtained.  Assessment:  Review of patient status, including review of consultants reports, laboratory and other test data, was performed as part of comprehensive evaluation and provision of chronic care management services.   SDOH (Social Determinants of Health) assessments and interventions performed:  SDOH Interventions    Flowsheet Row Most Recent Value  SDOH Interventions   Financial Strain Interventions Intervention Not Indicated        Objective:  Lab Results  Component Value Date   CREATININE 0.71 08/06/2020   CREATININE 0.86 01/21/2020   CREATININE 0.73 08/18/2019    Lab Results  Component Value Date   HGBA1C 8.9 (A) 11/24/2020       Component Value Date/Time   CHOL 82 08/06/2020 1011   CHOL 118 05/05/2019 1241   TRIG 75.0 08/06/2020 1011   HDL 42.30 08/06/2020 1011   HDL 48 05/05/2019 1241   CHOLHDL 2 08/06/2020 1011   VLDL 15.0 08/06/2020 1011   LDLCALC 25 08/06/2020 1011    LDLCALC 56 05/05/2019 1241   LDLDIRECT 23.0 01/21/2020 0845     Clinical ASCVD: No  The ASCVD Risk score (Arnett DK, et al., 2019) failed to calculate for the following reasons:   The valid total cholesterol range is 130 to 320 mg/dL      BP Readings from Last 3 Encounters:  11/24/20 130/80  11/08/20 111/75  10/06/20 107/74    Care Plan  No Known Allergies  Medications Reviewed Today     Reviewed by Gordy Councilman, CMA (Certified Medical Assistant) on 11/24/20 at 1457  Med List Status: <None>   Medication Order Taking? Sig Documenting Provider Last Dose Status Informant  clobetasol ointment (TEMOVATE) 0.05 % [id number] Yes Apply to stubborn areas of Eczema  For 2 weeks, then only on weekends. Avoid Face, groin, and under arms Moye, [place], MD Taking Active   Continuous Blood Gluc Sensor (FREESTYLE LIBRE 2 SENSOR) [place] [id number] Yes Use to check glucose at least TID Leone Haven, MD Taking Active   Dapsone (ACZONE) 7.5 % GEL [id number] Yes Apply small amount once daily to affected areas [facility], [place], MD Taking Active   fluticasone (CUTIVATE) 0.05 % cream [id number] Yes APPLY TO AFFECTED AREA UP TO TWICE A DAY AS NEEDED FOR RASH/ITCHING Leone Haven, MD Taking Active   glucose blood (FREESTYLE TEST STRIPS) test strip [id number] Yes Check sugar bid Jackolyn Confer, MD Taking Active   glucose monitoring kit (FREESTYLE) monitoring kit [id number] Yes Use as directed to check sugars bid Jackolyn Confer, MD Taking Active   hydrochlorothiazide (MICROZIDE) 12.5 MG capsule  [id number] Yes TAKE 1 CAPSULE BY MOUTH EVERY DAY Leone Haven, MD Taking Active   hydrocortisone 2.5 % cream [id number] Yes Apply topically 2 (two) times daily as needed (Rash). To face up to 2 weeks Laurence Ferrari, [place], MD Taking Active   Lancets (Hope) lancets [id number] Yes Use as instructed Jackolyn Confer, MD Taking Active   metFORMIN (GLUCOPHAGE-XR) 500 MG 24 hr tablet [id number] Yes  TAKE 2 TABLETS BY MOUTH TWICE A DAY Leone Haven, MD Taking Active            Med Note Darnelle Maffucci, Arville Lime   Fri Sep 17, 2020  1:41 PM)    norethindrone (CAMILA) 0.35 MG tablet [id number] Yes Take 1 tablet (0.35 mg total) by mouth daily. Leone Haven, MD Taking Active   rosuvastatin (CRESTOR) 20 MG tablet [id number] Yes Take 1 tablet (20 mg total) by mouth daily. Leone Haven, MD Taking Active   Semaglutide-Weight Management [facility]) 2.4 MG/0.75ML Darden Palmer [id number] Yes Inject 2.4 mg into the skin once a week. Dennard Nip D, MD Taking Active   spironolactone (ALDACTONE) 100 MG tablet [id number] Yes TAKE 1 TABLET BY MOUTH EVERY DAY Brendolyn Patty, MD Taking Active             Patient Active Problem List   Diagnosis Date Noted   Pes planus 11/24/2020   Vitamin D deficiency 02/27/2020   Hirsutism 01/23/2020   Hyperlipidemia associated with type 2 diabetes mellitus ([place]) 01/23/2020   Bilateral leg edema 09/24/2019   Abnormal mammogram 01/15/2019   GERD (gastroesophageal reflux disease) 05/26/2018   Anxiety and depression 07/03/2017   Fatigue due to depression 07/03/2017   Eczema 12/15/2016   Stress 08/15/2016   Severe obesity (BMI >= 40) ([place]) 09/04/2014   Diabetes mellitus type 2 in obese ([place]) 04/17/2011   Hypertension associated with diabetes ([place]) 04/17/2011    Conditions to be addressed/monitored: HTN, HLD, and DMII  Care Plan : Medication Management  Updates made by De Hollingshead, RPH-CPP since 11/24/2020 12:00 AM     Problem: Diabetes, Hypertension, Obesity      Long-Range Goal: Disease Progression Prevention   Recent Progress: On track  Priority: High  Note:   Current Barriers:  Unable to achieve control of diabetes  Unable to take medications regularly as prescribed  Pharmacist Clinical Goal(s):  Over the next 90 days, patient will achieve control of diabetes as evidenced by A1c through collaboration with PharmD and  provider  Interventions: 1:1 collaboration with Leone Haven, MD regarding development and update of comprehensive plan of care as evidenced by provider attestation and co-signature Inter-disciplinary care team collaboration (see longitudinal plan of care) Comprehensive medication review performed; medication list updated in electronic medical record  Diabetes: Uncontrolled; current treatment: metformin XR 1000 mg BID, Wegovy 2.4 mg weekly Hx Jardiance - stopped due to vaginal itching Reviewed today's A1c. Discussed that change to tirzepatide Darcel Bayley) may assist in greater glycemic benefit. Patient requests to think about need for back up contraception while on therapy. She will let me know if she is interested.  Would elect to avoid insulin, sulfonylurea, or pioglitazone given impact on weight.  Reports upcoming plans for bariatric surgery. Anticipate this will significantly help glycemic control. Reports upcoming appointment with dietician.  Discussed restarting CGM to provide more actionable data to guide medication and lifestyle. Patient plans to put CGM on later this week.   Hypertension: Controlled per last office visit; current treatment: HCTZ 12.5 mg daily, spironolactone 100 mg QAM (  acne) Previously recommended to continue current regimen  Hyperlipidemia: Controlled per last lab visit; current treatment: rosuvastatin 20 mg daily Previously recommended to continue current regimen.  Contraception: Appropriately managed; norethindrone 0.35 mg daily.  Previously encouraged continued discussion with OBGYN regarding goals of therapy.   Patient Goals/Self-Care Activities Over the next 90 days, patient will:  - take medications as prescribed check glucose periodically, document, and provide at future appointments target a minimum of 150 minutes of moderate intensity exercise weekly engage in dietary modifications by moderating portion sizes  Follow Up Plan: Telephone follow  up appointment with care management team member scheduled for: ~6 weeks     Medication Assistance:  None required.  Patient affirms current coverage meets needs.  Follow Up:  Patient agrees to Care Plan and Follow-up.  Plan: Telephone follow up appointment with care management team member scheduled for:  ~ 6 weeks  Catie Darnelle Maffucci, PharmD, [place], [place] Clinical Pharmacist [employer] at [employer] [phone]

## 2020-11-24 NOTE — Patient Instructions (Addendum)
Nice to see you. We will check lab work today. Please continue with diet and exercise changes. If you want to see somebody for custom orthotics for your feet please let me know.

## 2020-11-24 NOTE — Assessment & Plan Note (Signed)
Asymptomatic.  Discussed that she may want to see somebody for custom orthotics in the future.

## 2020-11-24 NOTE — Assessment & Plan Note (Signed)
Check A1c.  Continue Wegovy and metformin.

## 2020-11-25 LAB — BASIC METABOLIC PANEL
BUN: 13 mg/dL (ref 6–23)
CO2: 27 mEq/L (ref 19–32)
Calcium: 8.9 mg/dL (ref 8.4–10.5)
Chloride: 98 mEq/L (ref 96–112)
Creatinine, Ser: 0.72 mg/dL (ref 0.40–1.20)
GFR: 101.61 mL/min (ref >60.00)
Glucose, Bld: 225 mg/dL — ABNORMAL HIGH (ref 70–99)
Potassium: 3.7 mEq/L (ref 3.5–5.1)
Sodium: 133 mEq/L — ABNORMAL LOW (ref 135–145)

## 2020-11-30 ENCOUNTER — Telehealth: Payer: BC Managed Care – PPO

## 2020-12-01 ENCOUNTER — Ambulatory Visit (INDEPENDENT_AMBULATORY_CARE_PROVIDER_SITE_OTHER): Payer: BC Managed Care – PPO | Admitting: Family Medicine

## 2020-12-01 ENCOUNTER — Other Ambulatory Visit: Payer: Self-pay

## 2020-12-01 ENCOUNTER — Encounter (INDEPENDENT_AMBULATORY_CARE_PROVIDER_SITE_OTHER): Payer: Self-pay | Admitting: Family Medicine

## 2020-12-01 VITALS — BP 128/84 | HR 89 | Temp 98.6°F | Ht 65.0 in | Wt 276.0 lb

## 2020-12-01 DIAGNOSIS — E1169 Type 2 diabetes mellitus with other specified complication: Secondary | ICD-10-CM | POA: Diagnosis not present

## 2020-12-01 DIAGNOSIS — I152 Hypertension secondary to endocrine disorders: Secondary | ICD-10-CM

## 2020-12-01 DIAGNOSIS — E1159 Type 2 diabetes mellitus with other circulatory complications: Secondary | ICD-10-CM

## 2020-12-01 DIAGNOSIS — Z9189 Other specified personal risk factors, not elsewhere classified: Secondary | ICD-10-CM

## 2020-12-01 DIAGNOSIS — E785 Hyperlipidemia, unspecified: Secondary | ICD-10-CM

## 2020-12-01 DIAGNOSIS — Z6841 Body Mass Index (BMI) 40.0 and over, adult: Secondary | ICD-10-CM

## 2020-12-02 NOTE — Progress Notes (Signed)
Chief Complaint:   OBESITY Veronica Norton is here to discuss her progress with her obesity treatment plan along with follow-up of her obesity related diagnoses.   Today's visit was #: 28 Starting weight: 284 lbs Starting date: 02/03/2019 Today's weight: 276 lbs Today's date: 12/01/2020 Weight change since last visit: 8 lbs Total lbs lost to date: 0 Body mass index is 45.93 kg/m.  Total weight loss percentage to date: -2.82%  Current Meal Plan: the Category 1 Plan for 75% of the time.  Current Exercise Plan: Walking for 30-45 minutes 2-3 times per week. Current Anti-Obesity Medications: Wegovy 2.4 mg subcutaneously weekly. Side effects: None.  Interim History:  Veronica Norton's A1c is down to 8.9.  She is walking more.  She is finishing up her bariatric surgery requirements and expecting VSG before the end of the year.  Assessment/Plan:   1. Hypertension associated with diabetes ([place]) At goal. Medications: HCTZ 12.5 mg daily.   Plan: Avoid buying foods that are: processed, frozen, or prepackaged to avoid excess salt. We will watch for signs of hypotension as she continues lifestyle modifications.  BP Readings from Last 3 Encounters:  12/01/20 128/84  11/24/20 130/80  11/08/20 111/75   Lab Results  Component Value Date   CREATININE 0.72 11/24/2020   2. Hyperlipidemia associated with type 2 diabetes mellitus ([place]) Course: Controlled. Lipid-lowering medications: Crestor 20 mg daily.   Plan: Dietary changes: Increase soluble fiber, decrease simple carbohydrates, decrease saturated fat. Exercise changes: Moderate to vigorous-intensity aerobic activity 150 minutes per week or as tolerated. We will continue to monitor along with PCP/specialists as it pertains to her weight loss journey.  Lab Results  Component Value Date   CHOL 82 08/06/2020   HDL 42.30 08/06/2020   LDLCALC 25 08/06/2020   LDLDIRECT 23.0 01/21/2020   TRIG 75.0 08/06/2020   CHOLHDL 2 08/06/2020   Lab Results   Component Value Date   ALT 19 08/06/2020   AST 10 08/06/2020   ALKPHOS 85 08/06/2020   BILITOT 0.4 08/06/2020   3. Type 2 diabetes mellitus with other specified complication, without long-term current use of insulin (HCC) Diabetes Mellitus: Not at goal. Medication: Wegovy 2.4 mg subcutaneously weekly. Issues reviewed: blood sugar goals, complications of diabetes mellitus, hypoglycemia prevention and treatment, exercise, and nutrition.   Plan: The patient will continue to focus on protein-rich, low simple carbohydrate foods. We reviewed the importance of hydration, regular exercise for stress reduction, and restorative sleep.   Lab Results  Component Value Date   HGBA1C 8.9 (A) 11/24/2020   HGBA1C 9.6 (H) 08/06/2020   HGBA1C 7.6 (H) 02/27/2020   Lab Results  Component Value Date   MICROALBUR <0.7 02/27/2020   LDLCALC 25 08/06/2020   CREATININE 0.72 11/24/2020   4. At risk for heart disease Due to Veronica Norton's current state of health and medical condition(s), she is at a higher risk for heart disease.  This puts the patient at much greater risk to subsequently develop cardiopulmonary conditions that can significantly affect patient's quality of life in a negative manner.    At least 8 minutes were spent on counseling Veronica Norton about these concerns today. Evidence-based interventions for health behavior change were utilized today including the discussion of self monitoring techniques, problem-solving barriers, and SMART goal setting techniques.  Specifically, regarding patient's less desirable eating habits and patterns, we employed the technique of small changes when Veronica Norton has not been able to fully commit to her prudent nutritional plan.  5. Obesity, with current BMI  45.9  Course: Veronica Norton is currently in the action stage of change. As such, her goal is to continue with weight loss efforts.   Nutrition goals: She has agreed to the Category 1 Plan.   Exercise goals:  As is.  Behavioral  modification strategies: increasing lean protein intake, decreasing simple carbohydrates, increasing vegetables, and emotional eating strategies.  Veronica Norton has agreed to follow-up with our clinic in 4 weeks. She was informed of the importance of frequent follow-up visits to maximize her success with intensive lifestyle modifications for her multiple health conditions.   Objective:   Blood pressure 128/84, pulse 89, temperature 98.6 F (37 C), temperature source Oral, height 5\' 5"  (1.651 m), weight 276 lb (125.2 kg), last menstrual period 11/24/2020, SpO2 97 %. Body mass index is 45.93 kg/m.  General: Cooperative, alert, well developed, in no acute distress. HEENT: Conjunctivae and lids unremarkable. Cardiovascular: Regular rhythm.  Lungs: Normal work of breathing. Neurologic: No focal deficits.   Lab Results  Component Value Date   CREATININE 0.72 11/24/2020   BUN 13 11/24/2020   NA 133 (L) 11/24/2020   K 3.7 11/24/2020   CL 98 11/24/2020   CO2 27 11/24/2020   Lab Results  Component Value Date   ALT 19 08/06/2020   AST 10 08/06/2020   ALKPHOS 85 08/06/2020   BILITOT 0.4 08/06/2020   Lab Results  Component Value Date   HGBA1C 8.9 (A) 11/24/2020   HGBA1C 9.6 (H) 08/06/2020   HGBA1C 7.6 (H) 02/27/2020   HGBA1C 7.3 (H) 11/26/2019   HGBA1C 7.3 (H) 08/18/2019   Lab Results  Component Value Date   TSH 1.49 09/24/2019   Lab Results  Component Value Date   CHOL 82 08/06/2020   HDL 42.30 08/06/2020   LDLCALC 25 08/06/2020   LDLDIRECT 23.0 01/21/2020   TRIG 75.0 08/06/2020   CHOLHDL 2 08/06/2020   Lab Results  Component Value Date   VD25OH 22.62 (L) 05/07/2020   VD25OH 22.97 (L) 02/27/2020   VD25OH 40.6 05/05/2019   Lab Results  Component Value Date   WBC 9.3 08/18/2019   HGB 15.2 08/18/2019   HCT 44.2 08/18/2019   MCV 96 08/18/2019   PLT 425 08/18/2019   Attestation Statements:   Reviewed by clinician on day of visit: allergies, medications, problem list,  medical history, surgical history, family history, social history, and previous encounter notes.  I, Water quality scientist, CMA, am acting as transcriptionist for Briscoe Deutscher, DO  I have reviewed the above documentation for accuracy and completeness, and I agree with the above. Briscoe Deutscher, DO

## 2020-12-15 ENCOUNTER — Other Ambulatory Visit: Payer: Self-pay

## 2020-12-15 ENCOUNTER — Encounter: Payer: BC Managed Care – PPO | Attending: General Surgery | Admitting: Skilled Nursing Facility1

## 2020-12-15 DIAGNOSIS — E669 Obesity, unspecified: Secondary | ICD-10-CM | POA: Insufficient documentation

## 2020-12-15 DIAGNOSIS — E1169 Type 2 diabetes mellitus with other specified complication: Secondary | ICD-10-CM | POA: Insufficient documentation

## 2020-12-15 NOTE — Progress Notes (Signed)
Supervised Weight Loss Visit Bariatric Nutrition Education  Pt completed visits.   Pt has cleared nutrition requirements.    NUTRITION ASSESSMENT  Anthropometrics  Start weight at [facility]: 284.6 lbs (date: 10/06/2020) Today's weight: 280 lbs BMI: 46.59 kg/m2    Clinical  Medical hx: DM, HTN Medications: wegovy, metformin see list  Labs: A1C 8.9 vitamin D 22.62 Notable signs/symptoms:  Any previous deficiencies? No  Lifestyle & Dietary Hx  Pt states she never got the [place] because it was too expensive also stating she has not been checking her blood sugars.  Pt state she has been doing meal prepping with her friend. Pt states she has been walking about 2-3 times a week.   Estimated daily fluid intake:  oz Supplements:  Current average weekly physical activity: ADL's  24-Hr Dietary Recall First Meal: 4 [place] sausage + cup of peaches Snack:  Second Meal: hamburger steak + cabbage + carrots Snack:  Third Meal: jimmy johns + fritos Snack: oatmeal rainsin cookie Beverages: diet juice, fresca, water + flavoring, lite lemonade   Estimated Energy Needs Calories: 1500   NUTRITION DIAGNOSIS  Overweight/obesity ([place]-3.3) related to past poor dietary habits and physical inactivity as evidenced by patient w/ planned sleeve gastrectomy surgery following dietary guidelines for continued weight loss.   NUTRITION INTERVENTION  Nutrition counseling (C-1) and education (E-2) to facilitate bariatric surgery goals.  Pre-Op Goals Progress & New Goals Continue: Make healthy food choices while monitoring portion sizes: make more meals from home  Continue: Practice CHEWING your food (aim for applesauce consistency) Continue: Practice not drinking 15 minutes before, during, and 30 minutes after each meal and snack Continue: Aim for 64-100 ounces of FLUID daily (with at least half of fluid intake being plain water)   Learning Style & Readiness for Change Teaching method utilized: Visual &  Auditory  Demonstrated degree of understanding via: Teach Back  Readiness Level: action Barriers to learning/adherence to lifestyle change: none identified   MONITORING & EVALUATION Dietary intake, weekly physical activity, body weight, and pre-op goals   Next Steps  Patient is to return to [facility] for pre-op class Pt has completed visits. No further supervised visits required/recomended

## 2020-12-27 ENCOUNTER — Ambulatory Visit (INDEPENDENT_AMBULATORY_CARE_PROVIDER_SITE_OTHER): Payer: BC Managed Care – PPO | Admitting: Family Medicine

## 2020-12-27 ENCOUNTER — Other Ambulatory Visit: Payer: Self-pay

## 2020-12-27 ENCOUNTER — Encounter (INDEPENDENT_AMBULATORY_CARE_PROVIDER_SITE_OTHER): Payer: Self-pay | Admitting: Family Medicine

## 2020-12-27 VITALS — BP 124/84 | HR 88 | Temp 98.1°F | Ht 65.0 in | Wt 276.0 lb

## 2020-12-27 DIAGNOSIS — E1159 Type 2 diabetes mellitus with other circulatory complications: Secondary | ICD-10-CM

## 2020-12-27 DIAGNOSIS — E1169 Type 2 diabetes mellitus with other specified complication: Secondary | ICD-10-CM

## 2020-12-27 DIAGNOSIS — Z6841 Body Mass Index (BMI) 40.0 and over, adult: Secondary | ICD-10-CM

## 2020-12-27 DIAGNOSIS — E785 Hyperlipidemia, unspecified: Secondary | ICD-10-CM | POA: Diagnosis not present

## 2020-12-27 DIAGNOSIS — I152 Hypertension secondary to endocrine disorders: Secondary | ICD-10-CM

## 2020-12-27 MED ORDER — WEGOVY 2.4 MG/0.75ML ~~LOC~~ SOAJ: 2.4000 mg | SUBCUTANEOUS | 0 refills | Status: AC

## 2020-12-28 NOTE — Progress Notes (Signed)
Chief Complaint:   OBESITY Veronica Norton is here to discuss her progress with her obesity treatment plan along with follow-up of her obesity related diagnoses. See Medical Weight Management Flowsheet for complete bioelectrical impedance results.  Today's visit was #: 52 Starting weight: 284 lbs Starting date: 02/03/2019 Weight change since last visit: 0 Total lbs lost to date: 8 lbs Total weight loss percentage to date: -2.82%  Nutrition Plan: Category 1 Meal Plan for 60% of the time. Activity: Walking for 45 minutes 2 times per week. Anti-obesity medications: Wegovy 2.4 mg subcutaneously weekly. Reported side effects: None.  Interim History: Veronica Norton has a low mood today.  She is finishing workup for bariatric surgery.  She understands that this is a good move for her, but is grieving the possibility of pregnancy.  Assessment/Plan:   1. Type 2 diabetes mellitus with other specified complication, without long-term current use of insulin (HCC) Diabetes Mellitus: Not at goal. Medication: metformin XR 1000 mg twice daily, Wegovy 2.4 mg subcutaneously weekly.   Plan: The patient will continue to focus on protein-rich, low simple carbohydrate foods. We reviewed the importance of hydration, regular exercise for stress reduction, and restorative sleep.   Lab Results  Component Value Date   HGBA1C 8.9 (A) 11/24/2020   HGBA1C 9.6 (H) 08/06/2020   HGBA1C 7.6 (H) 02/27/2020   Lab Results  Component Value Date   MICROALBUR <0.7 02/27/2020   LDLCALC 25 08/06/2020   CREATININE 0.72 11/24/2020   2. Hyperlipidemia associated with type 2 diabetes mellitus ([place]) Course: At goal. Lipid-lowering medications: Crestor 20 mg daily.   Plan: Dietary changes: Increase soluble fiber, decrease simple carbohydrates, decrease saturated fat. Exercise changes: Moderate to vigorous-intensity aerobic activity 150 minutes per week or as tolerated. We will continue to monitor along with PCP/specialists as it  pertains to her weight loss journey.  Lab Results  Component Value Date   CHOL 82 08/06/2020   HDL 42.30 08/06/2020   LDLCALC 25 08/06/2020   LDLDIRECT 23.0 01/21/2020   TRIG 75.0 08/06/2020   CHOLHDL 2 08/06/2020   Lab Results  Component Value Date   ALT 19 08/06/2020   AST 10 08/06/2020   ALKPHOS 85 08/06/2020   BILITOT 0.4 08/06/2020   3. Hypertension associated with diabetes ([place]) At goal. Medications: HCTZ 12.5 mg daily, spironolactone 100 mg daily.   Plan: Avoid buying foods that are: processed, frozen, or prepackaged to avoid excess salt. We will watch for signs of hypotension as she continues lifestyle modifications.  BP Readings from Last 3 Encounters:  12/27/20 124/84  12/01/20 128/84  11/24/20 130/80   Lab Results  Component Value Date   CREATININE 0.72 11/24/2020   4. Obesity with current BMI 45.9  - Refill Semaglutide-Weight Management (WEGOVY) 2.4 MG/0.75ML SOAJ; Inject 2.4 mg into the skin once a week.  Dispense: 9 mL; Refill: 0  Course: Veronica Norton is currently in the action stage of change. As such, her goal is to continue with weight loss efforts.   Nutrition goals: She has agreed to the Category 1 Plan.   Exercise goals:  As is.  Behavioral modification strategies: increasing lean protein intake, decreasing simple carbohydrates, increasing vegetables, increasing water intake, and emotional eating strategies.  [name] has agreed to follow-up with our clinic in 4 weeks. She was informed of the importance of frequent follow-up visits to maximize her success with intensive lifestyle modifications for her multiple health conditions.   Objective:   Blood pressure 124/84, pulse 88, temperature 98.1 F (  36.7 C), temperature source Oral, height 5\' 5"  (1.651 m), weight 276 lb (125.2 kg), SpO2 96 %. Body mass index is 45.93 kg/m.  General: Cooperative, alert, well developed, in no acute distress. HEENT: Conjunctivae and lids unremarkable. Cardiovascular: Regular  rhythm.  Lungs: Normal work of breathing. Neurologic: No focal deficits.   Lab Results  Component Value Date   CREATININE 0.72 11/24/2020   BUN 13 11/24/2020   NA 133 (L) 11/24/2020   K 3.7 11/24/2020   CL 98 11/24/2020   CO2 27 11/24/2020   Lab Results  Component Value Date   ALT 19 08/06/2020   AST 10 08/06/2020   ALKPHOS 85 08/06/2020   BILITOT 0.4 08/06/2020   Lab Results  Component Value Date   HGBA1C 8.9 (A) 11/24/2020   HGBA1C 9.6 (H) 08/06/2020   HGBA1C 7.6 (H) 02/27/2020   HGBA1C 7.3 (H) 11/26/2019   HGBA1C 7.3 (H) 08/18/2019   Lab Results  Component Value Date   TSH 1.49 09/24/2019   Lab Results  Component Value Date   CHOL 82 08/06/2020   HDL 42.30 08/06/2020   LDLCALC 25 08/06/2020   LDLDIRECT 23.0 01/21/2020   TRIG 75.0 08/06/2020   CHOLHDL 2 08/06/2020   Lab Results  Component Value Date   VD25OH 22.62 (L) 05/07/2020   VD25OH 22.97 (L) 02/27/2020   VD25OH 40.6 05/05/2019   Lab Results  Component Value Date   WBC 9.3 08/18/2019   HGB 15.2 08/18/2019   HCT 44.2 08/18/2019   MCV 96 08/18/2019   PLT 425 08/18/2019   Attestation Statements:   Reviewed by clinician on day of visit: allergies, medications, problem list, medical history, surgical history, family history, social history, and previous encounter notes.  I, Water quality scientist, CMA, am acting as transcriptionist for Briscoe Deutscher, DO  I have reviewed the above documentation for accuracy and completeness, and I agree with the above. -  Briscoe Deutscher, DO, MS, FAAFP, DABOM - Family and Bariatric Medicine.

## 2021-01-07 ENCOUNTER — Telehealth: Payer: BC Managed Care – PPO

## 2021-01-12 ENCOUNTER — Other Ambulatory Visit: Payer: Self-pay | Admitting: Family Medicine

## 2021-01-12 DIAGNOSIS — E1169 Type 2 diabetes mellitus with other specified complication: Secondary | ICD-10-CM

## 2021-01-12 DIAGNOSIS — E669 Obesity, unspecified: Secondary | ICD-10-CM

## 2021-01-21 DIAGNOSIS — Z1231 Encounter for screening mammogram for malignant neoplasm of breast: Secondary | ICD-10-CM | POA: Diagnosis not present

## 2021-01-31 ENCOUNTER — Ambulatory Visit (INDEPENDENT_AMBULATORY_CARE_PROVIDER_SITE_OTHER): Payer: BC Managed Care – PPO | Admitting: Family Medicine

## 2021-01-31 ENCOUNTER — Encounter (INDEPENDENT_AMBULATORY_CARE_PROVIDER_SITE_OTHER): Payer: Self-pay | Admitting: Family Medicine

## 2021-01-31 ENCOUNTER — Other Ambulatory Visit: Payer: Self-pay

## 2021-01-31 VITALS — BP 136/87 | HR 85 | Temp 97.6°F | Ht 65.0 in | Wt 275.0 lb

## 2021-01-31 DIAGNOSIS — E1169 Type 2 diabetes mellitus with other specified complication: Secondary | ICD-10-CM | POA: Diagnosis not present

## 2021-01-31 DIAGNOSIS — E785 Hyperlipidemia, unspecified: Secondary | ICD-10-CM

## 2021-01-31 DIAGNOSIS — E1159 Type 2 diabetes mellitus with other circulatory complications: Secondary | ICD-10-CM

## 2021-01-31 DIAGNOSIS — Z6841 Body Mass Index (BMI) 40.0 and over, adult: Secondary | ICD-10-CM

## 2021-01-31 DIAGNOSIS — I152 Hypertension secondary to endocrine disorders: Secondary | ICD-10-CM

## 2021-02-01 NOTE — Progress Notes (Signed)
Chief Complaint:   OBESITY Veronica Norton is here to discuss her progress with her obesity treatment plan along with follow-up of her obesity related diagnoses. See Medical Weight Management Flowsheet for complete bioelectrical impedance results.  Today's visit was #: 45 Starting weight: 284 lbs Starting date: 02/03/2019 Weight change since last visit: 2 lbs Total lbs lost to date: 9 lbs Total weight loss percentage to date: -3.17%  Nutrition Plan: Category 1 Plan for 65-70% of the time.  Activity: Walking for 45 minutes 2 times per week.  Anti-obesity medications: Wegovy 2.4 mg subcutaneously weekly. Reported side effects: None.  Interim History: Exa says she is thinking about holding off on her surgery date.  She says she has a fear of surgery - poor outcome, etc.  She admits that she knows surgery is the best course of action.  Assessment/Plan:   1. Type 2 diabetes mellitus with other specified complication, without long-term current use of insulin (HCC) Diabetes Mellitus: Not at goal. Medication: Wegovy 2.4 mg subcutaneously weekly, metformin XR 1,000 mg twice daily. Issues reviewed: blood sugar goals, complications of diabetes mellitus, hypoglycemia prevention and treatment, exercise, and nutrition.  Plan:  Continue Wegovy and metformin as prescribed. The patient will continue to focus on protein-rich, low simple carbohydrate foods. We reviewed the importance of hydration, regular exercise for stress reduction, and restorative sleep.   Lab Results  Component Value Date   HGBA1C 8.9 (A) 11/24/2020   HGBA1C 9.6 (H) 08/06/2020   HGBA1C 7.6 (H) 02/27/2020   Lab Results  Component Value Date   MICROALBUR <0.7 02/27/2020   LDLCALC 25 08/06/2020   CREATININE 0.72 11/24/2020   2. Hyperlipidemia associated with type 2 diabetes mellitus ([place]) Course: Controlled. Lipid-lowering medications: Crestor 20 mg daily.   Plan: Dietary changes: Increase soluble fiber, decrease simple  carbohydrates, decrease saturated fat. Exercise changes: Moderate to vigorous-intensity aerobic activity 150 minutes per week or as tolerated. We will continue to monitor along with PCP as it pertains to her weight loss journey.  Lab Results  Component Value Date   CHOL 82 08/06/2020   HDL 42.30 08/06/2020   LDLCALC 25 08/06/2020   LDLDIRECT 23.0 01/21/2020   TRIG 75.0 08/06/2020   CHOLHDL 2 08/06/2020   Lab Results  Component Value Date   ALT 19 08/06/2020   AST 10 08/06/2020   ALKPHOS 85 08/06/2020   BILITOT 0.4 08/06/2020   3. Hypertension associated with diabetes ([place]) At goal. Medications: HCTZ 12.5 mg daily.   Plan: Avoid buying foods that are: processed, frozen, or prepackaged to avoid excess salt. We will watch for signs of hypotension as she continues lifestyle modifications.  BP Readings from Last 3 Encounters:  01/31/21 136/87  12/27/20 124/84  12/01/20 128/84   Lab Results  Component Value Date   CREATININE 0.72 11/24/2020   4. Obesity with current BMI 45.8  Course: Lurie is currently in the action stage of change. As such, her goal is to continue with weight loss efforts.   Nutrition goals: She has agreed to the Category 1 Plan.   Exercise goals:  As is.  Behavioral modification strategies: increasing lean protein intake, decreasing simple carbohydrates, increasing vegetables, increasing water intake, and decreasing liquid calories.  Veronica Norton has agreed to follow-up with our clinic in 4 weeks. She was informed of the importance of frequent follow-up visits to maximize her success with intensive lifestyle modifications for her multiple health conditions.   Objective:   Blood pressure 136/87, pulse 85, temperature 97.6  F (36.4 C), temperature source Oral, height 5\' 5"  (1.651 m), weight 275 lb (124.7 kg), SpO2 96 %. Body mass index is 45.76 kg/m.  General: Cooperative, alert, well developed, in no acute distress. HEENT: Conjunctivae and lids  unremarkable. Cardiovascular: Regular rhythm.  Lungs: Normal work of breathing. Neurologic: No focal deficits.   Lab Results  Component Value Date   CREATININE 0.72 11/24/2020   BUN 13 11/24/2020   NA 133 (L) 11/24/2020   K 3.7 11/24/2020   CL 98 11/24/2020   CO2 27 11/24/2020   Lab Results  Component Value Date   ALT 19 08/06/2020   AST 10 08/06/2020   ALKPHOS 85 08/06/2020   BILITOT 0.4 08/06/2020   Lab Results  Component Value Date   HGBA1C 8.9 (A) 11/24/2020   HGBA1C 9.6 (H) 08/06/2020   HGBA1C 7.6 (H) 02/27/2020   HGBA1C 7.3 (H) 11/26/2019   HGBA1C 7.3 (H) 08/18/2019   Lab Results  Component Value Date   TSH 1.49 09/24/2019   Lab Results  Component Value Date   CHOL 82 08/06/2020   HDL 42.30 08/06/2020   LDLCALC 25 08/06/2020   LDLDIRECT 23.0 01/21/2020   TRIG 75.0 08/06/2020   CHOLHDL 2 08/06/2020   Lab Results  Component Value Date   VD25OH 22.62 (L) 05/07/2020   VD25OH 22.97 (L) 02/27/2020   VD25OH 40.6 05/05/2019   Lab Results  Component Value Date   WBC 9.3 08/18/2019   HGB 15.2 08/18/2019   HCT 44.2 08/18/2019   MCV 96 08/18/2019   PLT 425 08/18/2019   Attestation Statements:   Reviewed by clinician on day of visit: allergies, medications, problem list, medical history, surgical history, family history, social history, and previous encounter notes.  Time spent on visit including pre-visit chart review and post-visit care and documentation was 32 minutes. Time was spent on: I discussed a personalized meal plan with the patient that will help her to lose weight and will improve her obesity-related conditions going forward. I performed a medically necessary appropriate examination and/or evaluation. I discussed the assessment and treatment plan with the patient. Motivational interviewing as well as evidence-based interventions for health behavior change were utilized today including the discussion of self monitoring techniques, problem-solving  barriers and SMART goal setting techniques.  An exercise prescription was reviewed.  The patient was provided an opportunity to ask questions and all were answered. The patient agreed with the plan and demonstrated an understanding of the instructions. Clinical information was updated and documented in the EMR.  I, Water quality scientist, CMA, am acting as transcriptionist for Briscoe Deutscher, DO  I have reviewed the above documentation for accuracy and completeness, and I agree with the above. -  Briscoe Deutscher, DO, MS, FAAFP, DABOM - Family and Bariatric Medicine.

## 2021-02-07 DIAGNOSIS — Z01419 Encounter for gynecological examination (general) (routine) without abnormal findings: Secondary | ICD-10-CM | POA: Diagnosis not present

## 2021-02-07 DIAGNOSIS — Z3041 Encounter for surveillance of contraceptive pills: Secondary | ICD-10-CM | POA: Diagnosis not present

## 2021-02-07 DIAGNOSIS — I10 Essential (primary) hypertension: Secondary | ICD-10-CM | POA: Diagnosis not present

## 2021-02-07 DIAGNOSIS — Z309 Encounter for contraceptive management, unspecified: Secondary | ICD-10-CM | POA: Diagnosis not present

## 2021-02-07 DIAGNOSIS — E119 Type 2 diabetes mellitus without complications: Secondary | ICD-10-CM | POA: Diagnosis not present

## 2021-02-28 ENCOUNTER — Other Ambulatory Visit: Payer: Self-pay

## 2021-02-28 ENCOUNTER — Encounter (INDEPENDENT_AMBULATORY_CARE_PROVIDER_SITE_OTHER): Payer: Self-pay | Admitting: Family Medicine

## 2021-02-28 ENCOUNTER — Ambulatory Visit (INDEPENDENT_AMBULATORY_CARE_PROVIDER_SITE_OTHER): Payer: BC Managed Care – PPO | Admitting: Family Medicine

## 2021-02-28 VITALS — BP 123/81 | HR 81 | Temp 97.6°F | Ht 65.0 in | Wt 272.0 lb

## 2021-02-28 DIAGNOSIS — Z6841 Body Mass Index (BMI) 40.0 and over, adult: Secondary | ICD-10-CM

## 2021-02-28 DIAGNOSIS — E1169 Type 2 diabetes mellitus with other specified complication: Secondary | ICD-10-CM

## 2021-02-28 DIAGNOSIS — F3289 Other specified depressive episodes: Secondary | ICD-10-CM

## 2021-03-01 NOTE — Progress Notes (Signed)
Chief Complaint:   OBESITY Veronica Norton is here to discuss her progress with her obesity treatment plan along with follow-up of her obesity related diagnoses. See Medical Weight Management Flowsheet for complete bioelectrical impedance results.  Today's visit was #: 17 Starting weight: 284 lbs Starting date: 02/03/2019 Weight change since last visit: 3 lbs Total lbs lost to date: 12 lbs Total weight loss percentage to date: -4.23%  Nutrition Plan: Category 1 Plan for 75% of the time.  Activity: Walking for 30-45 minutes 1-2 times per week.  Anti-obesity medications: Wegovy 2.4 mg subcutaneously weekly. Reported side effects: None.  Interim History: Velda plans on bariatric surgery in early 2023.  Assessment/Plan:   1. Type 2 diabetes mellitus with other specified complication, without long-term current use of insulin (HCC) Diabetes Mellitus: Not at goal. Medication: metformin XR 1,000 mg twice daily, Wegovy 2.4 mg subcutaneously weekly.  Issues reviewed: blood sugar goals, complications of diabetes mellitus, hypoglycemia prevention and treatment, exercise, and nutrition.  Plan:  Continue metformin and Wegovy at current doses.  The patient will continue to focus on protein-rich, low simple carbohydrate foods. We reviewed the importance of hydration, regular exercise for stress reduction, and restorative sleep.   Lab Results  Component Value Date   HGBA1C 8.9 (A) 11/24/2020   HGBA1C 9.6 (H) 08/06/2020   HGBA1C 7.6 (H) 02/27/2020   Lab Results  Component Value Date   MICROALBUR <0.7 02/27/2020   LDLCALC 25 08/06/2020   CREATININE 0.72 11/24/2020   2. Other depression, with emotional eating Controlled. Medication: None.   Plan:  Discussed cues and consequences, how thoughts affect eating, model of thoughts, feelings, and behaviors, and strategies for change by focusing on the cue. Discussed cognitive distortions, coping thoughts, and how to change your thoughts.  3. Obesity  with current BMI 45.4  Course: [name] is currently in the action stage of change. As such, her goal is to continue with weight loss efforts.   Nutrition goals: She has agreed to the Category 1 Plan.   Exercise goals:  As is.  Behavioral modification strategies: increasing lean protein intake, decreasing simple carbohydrates, increasing vegetables, and increasing water intake.  [name] has agreed to follow-up with our clinic in 4-6 weeks. She was informed of the importance of frequent follow-up visits to maximize her success with intensive lifestyle modifications for her multiple health conditions.   Objective:   Blood pressure 123/81, pulse 81, temperature 97.6 F (36.4 C), temperature source Oral, height 5\' 5"  (1.651 m), weight 272 lb (123.4 kg), SpO2 98 %. Body mass index is 45.26 kg/m.  General: Cooperative, alert, well developed, in no acute distress. HEENT: Conjunctivae and lids unremarkable. Cardiovascular: Regular rhythm.  Lungs: Normal work of breathing. Neurologic: No focal deficits.   Lab Results  Component Value Date   CREATININE 0.72 11/24/2020   BUN 13 11/24/2020   NA 133 (L) 11/24/2020   K 3.7 11/24/2020   CL 98 11/24/2020   CO2 27 11/24/2020   Lab Results  Component Value Date   ALT 19 08/06/2020   AST 10 08/06/2020   ALKPHOS 85 08/06/2020   BILITOT 0.4 08/06/2020   Lab Results  Component Value Date   HGBA1C 8.9 (A) 11/24/2020   HGBA1C 9.6 (H) 08/06/2020   HGBA1C 7.6 (H) 02/27/2020   HGBA1C 7.3 (H) 11/26/2019   HGBA1C 7.3 (H) 08/18/2019   Lab Results  Component Value Date   TSH 1.49 09/24/2019   Lab Results  Component Value Date   CHOL  82 08/06/2020   HDL 42.30 08/06/2020   LDLCALC 25 08/06/2020   LDLDIRECT 23.0 01/21/2020   TRIG 75.0 08/06/2020   CHOLHDL 2 08/06/2020   Lab Results  Component Value Date   VD25OH 22.62 (L) 05/07/2020   VD25OH 22.97 (L) 02/27/2020   VD25OH 40.6 05/05/2019   Lab Results  Component Value Date   WBC 9.3  08/18/2019   HGB 15.2 08/18/2019   HCT 44.2 08/18/2019   MCV 96 08/18/2019   PLT 425 08/18/2019   Attestation Statements:   Reviewed by clinician on day of visit: allergies, medications, problem list, medical history, surgical history, family history, social history, and previous encounter notes.  I, Water quality scientist, CMA, am acting as transcriptionist for Briscoe Deutscher, DO  I have reviewed the above documentation for accuracy and completeness, and I agree with the above. -  Briscoe Deutscher, DO, MS, FAAFP, DABOM - Family and Bariatric Medicine.

## 2021-03-02 ENCOUNTER — Ambulatory Visit: Payer: BC Managed Care – PPO | Admitting: Family Medicine

## 2021-03-02 ENCOUNTER — Encounter: Payer: Self-pay | Admitting: Family Medicine

## 2021-03-02 ENCOUNTER — Other Ambulatory Visit: Payer: Self-pay

## 2021-03-02 DIAGNOSIS — I152 Hypertension secondary to endocrine disorders: Secondary | ICD-10-CM

## 2021-03-02 DIAGNOSIS — E1169 Type 2 diabetes mellitus with other specified complication: Secondary | ICD-10-CM

## 2021-03-02 DIAGNOSIS — R21 Rash and other nonspecific skin eruption: Secondary | ICD-10-CM | POA: Diagnosis not present

## 2021-03-02 DIAGNOSIS — E1159 Type 2 diabetes mellitus with other circulatory complications: Secondary | ICD-10-CM | POA: Diagnosis not present

## 2021-03-02 DIAGNOSIS — R6889 Other general symptoms and signs: Secondary | ICD-10-CM | POA: Insufficient documentation

## 2021-03-02 DIAGNOSIS — E669 Obesity, unspecified: Secondary | ICD-10-CM

## 2021-03-02 NOTE — Progress Notes (Signed)
Veronica Rumps, MD Phone: [phone]  Veronica Norton is a 45 y.o. female who presents today for f/u.  DIABETES Disease Monitoring: Blood Sugar ranges-not checking Polyuria/phagia/dipsia- no      Optho- UTD Medications: Compliance- taking metformin, wegovy Hypoglycemic symptoms- no  HYPERTENSION Disease Monitoring Home BP Monitoring not checking Chest pain- no    Dyspnea- no Medications Compliance-  taking HCTZ.   Edema- no BMET    Component Value Date/Time   NA 133 (L) 11/24/2020 1520   NA 137 08/18/2019 1712   K 3.7 11/24/2020 1520   CL 98 11/24/2020 1520   CO2 27 11/24/2020 1520   GLUCOSE 225 (H) 11/24/2020 1520   BUN 13 11/24/2020 1520   BUN 13 08/18/2019 1712   CREATININE 0.72 11/24/2020 1520   CREATININE 0.67 02/06/2018 1506   CALCIUM 8.9 11/24/2020 1520   GFRNONAA 101 08/18/2019 1712   GFRAA 117 08/18/2019 1712   Cold sensation: Patient notes she feels like her body is cold most of the time.  She notes no hair loss that does have some dry patches of skin.  No history of thyroid dysfunction.  Obesity: Patient is considering bariatric surgery next year.  She is avoiding fried foods and doing some meal prepping.  She walks 3 days a week for 30 to 45 minutes at a time.  She is on wegovy.  She follows with the weight management clinic.  Rash: Patient has had a rash on the back of her neck for quite some time.  It gets dry.  She attributes this to her diabetes.   Social History   Tobacco Use  Smoking Status Never  Smokeless Tobacco Never    Current Outpatient Medications on File Prior to Visit  Medication Sig Dispense Refill   clobetasol ointment (TEMOVATE) 0.05 % Apply to stubborn areas of Eczema  For 2 weeks, then only on weekends. Avoid Face, groin, and under arms 45 g 3   Dapsone (ACZONE) 7.5 % GEL Apply small amount once daily to affected areas 60 g 2   fluticasone (CUTIVATE) 0.05 % cream APPLY TO AFFECTED AREA UP TO TWICE A DAY AS NEEDED FOR  RASH/ITCHING 60 g 0   glucose monitoring kit (FREESTYLE) monitoring kit Use as directed to check sugars bid 1 each 0   hydrochlorothiazide (MICROZIDE) 12.5 MG capsule TAKE 1 CAPSULE BY MOUTH EVERY DAY 90 capsule 1   hydrocortisone 2.5 % cream Apply topically 2 (two) times daily as needed (Rash). To face up to 2 weeks 30 g 1   metFORMIN (GLUCOPHAGE-XR) 500 MG 24 hr tablet TAKE 2 TABLETS BY MOUTH TWICE A DAY 360 tablet 1   norethindrone (CAMILA) 0.35 MG tablet Take 1 tablet (0.35 mg total) by mouth daily. 28 tablet 6   rosuvastatin (CRESTOR) 20 MG tablet TAKE 1 TABLET BY MOUTH EVERY DAY 90 tablet 3   Semaglutide-Weight Management (WEGOVY) 2.4 MG/0.75ML SOAJ Inject 2.4 mg into the skin once a week. 9 mL 0   spironolactone (ALDACTONE) 100 MG tablet TAKE 1 TABLET BY MOUTH EVERY DAY 90 tablet 0   No current facility-administered medications on file prior to visit.     ROS see history of present illness  Objective  Physical Exam Vitals:   03/02/21 1419  BP: 130/80  Pulse: 92  Temp: 98.4 F (36.9 C)  SpO2: 99%    BP Readings from Last 3 Encounters:  03/02/21 130/80  02/28/21 123/81  01/31/21 136/87   Wt Readings from Last 3 Encounters:  03/02/21  279 lb 6.4 oz (126.7 kg)  02/28/21 272 lb (123.4 kg)  01/31/21 275 lb (124.7 kg)    Physical Exam Constitutional:      General: She is not in acute distress.    Appearance: She is not diaphoretic.  Cardiovascular:     Rate and Rhythm: Normal rate and regular rhythm.     Heart sounds: Normal heart sounds.  Pulmonary:     Effort: Pulmonary effort is normal.     Breath sounds: Normal breath sounds.  Skin:    General: Skin is warm and dry.  Neurological:     Mental Status: She is alert.    Rash on neck  Assessment/Plan: Please see individual problem list.  Problem List Items Addressed This Visit     Diabetes mellitus type 2 in obese (HCC)    Check A1c.  She will continue metformin 1000 mg twice daily.  She will also continue  wegovy.      Relevant Orders   Urine Microalbumin w/creat. ratio   HgB A1c   Hypertension associated with diabetes (HCC)    Adequate control.  She will continue HCTZ 12.5 mg daily.      Rash    Possibly related to acanthosis nigra cans though there are some features that make me think this is more likely an eczema issue. She has a dermatologist and she will call them to set up follow-up.       Sensation of feeling cold    Undetermined cause.  We will check lab work to evaluate for possible underlying causes.      Relevant Orders   CBC   TSH   Severe obesity (BMI >= 40) (HCC)    Encouraged increasing her exercise.  Discussed portion control.  She will continue wegovy.        Health Maintenance: we are requesting mammogram and pap smear results from her GYN.   Return in about 4 months (around 07/01/2021) for Diabetes/hypertension.  This visit occurred during the SARS-CoV-2 public health emergency.  Safety protocols were in place, including screening questions prior to the visit, additional usage of staff PPE, and extensive cleaning of exam room while observing appropriate contact time as indicated for disinfecting solutions.    Veronica Rumps, MD [place]

## 2021-03-02 NOTE — Assessment & Plan Note (Signed)
Check A1c.  She will continue metformin 1000 mg twice daily.  She will also continue wegovy.

## 2021-03-02 NOTE — Patient Instructions (Signed)
Nice to see you. Please work on portion control and increasing exercise. We will get lab work and contact you with the results.

## 2021-03-02 NOTE — Assessment & Plan Note (Signed)
Encouraged increasing her exercise.  Discussed portion control.  She will continue wegovy.

## 2021-03-02 NOTE — Assessment & Plan Note (Signed)
Undetermined cause.  We will check lab work to evaluate for possible underlying causes.

## 2021-03-02 NOTE — Assessment & Plan Note (Signed)
Adequate control.  She will continue HCTZ 12.5 mg daily.

## 2021-03-02 NOTE — Assessment & Plan Note (Addendum)
Possibly related to acanthosis nigra cans though there are some features that make me think this is more likely an eczema issue. She has a dermatologist and she will call them to set up follow-up.

## 2021-03-03 LAB — CBC
HCT: 42.8 % (ref 36.0–46.0)
Hemoglobin: 14.5 g/dL (ref 12.0–15.0)
MCHC: 33.8 g/dL (ref 30.0–36.0)
MCV: 96.8 fl (ref 78.0–100.0)
Platelets: 358 10*3/uL (ref 150.0–400.0)
RBC: 4.42 Mil/uL (ref 3.87–5.11)
RDW: 12.9 % (ref 11.5–15.5)
WBC: 7.3 10*3/uL (ref 4.0–10.5)

## 2021-03-03 LAB — MICROALBUMIN / CREATININE URINE RATIO
Creatinine,U: 84.1 mg/dL
Microalb Creat Ratio: 1.2 mg/g (ref 0.0–30.0)
Microalb, Ur: 1 mg/dL (ref 0.0–1.9)

## 2021-03-03 LAB — HEMOGLOBIN A1C: Hgb A1c MFr Bld: 10.7 % — ABNORMAL HIGH (ref 4.6–6.5)

## 2021-03-03 LAB — TSH: TSH: 1.08 u[IU]/mL (ref 0.35–5.50)

## 2021-03-16 NOTE — Progress Notes (Signed)
Patient has an appointment with Pharm D on 03/28/21 she would like to discuss with Pharm D the options for her A1c being 10.7.

## 2021-03-24 ENCOUNTER — Ambulatory Visit: Payer: BC Managed Care – PPO | Admitting: Pharmacist

## 2021-03-24 DIAGNOSIS — E1169 Type 2 diabetes mellitus with other specified complication: Secondary | ICD-10-CM

## 2021-03-24 MED ORDER — PEN NEEDLES 32G X 4 MM MISC: 1.0000 | Freq: Every day | 2 refills | Status: DC

## 2021-03-24 MED ORDER — FREESTYLE LIBRE 3 SENSOR MISC: 1.0000 | 11 refills | Status: DC

## 2021-03-24 MED ORDER — TRESIBA FLEXTOUCH 100 UNIT/ML ~~LOC~~ SOPN: 10.0000 [IU] | PEN_INJECTOR | Freq: Every day | SUBCUTANEOUS | 2 refills | Status: DC

## 2021-03-24 NOTE — Progress Notes (Signed)
Chief Complaint  Patient presents with   Care Coordination    Follow Up    Veronica Norton is a 46 y.o. year old female who was referred for medication management by their primary care provider, Leone Haven, MD. They presented for a telephone visit  Subjective: Diabetes:  Current medications: metformin XR 1000 mg BID, Wegovy 2.4 mg weekly Medications tried in the past: Trulicity (changed to semaglutide); Jardiance (GU infections)  Current glucose readings: not checking lately, does remember a "high" reading (not saying "high", but reports it was a higher reading)  Patient reports hyperglycemic symptoms including polyuria, polydipsia, polyphagia, nocturia, neuropathy, blurred vision.  Current meal patterns:  - Breakfast: none today; somedays has [place] sausage, fruit; ate some chipotle; sometimes biscuits  - Lunch: has been doing some meal prep; BBQ meatballs, zucchini and squash, mashed potatoes  - Supper: meal prep meal; sometimes goes out for sandwiches, chicken - Drinks: sparkling ice; sugar free nature's twist;  Current physical activity: trying to walk twice weekly  Current medication access support: none needed  Hypertension:  Current medications: HCTZ 12.5 mg daily; stopped spironolactone (originally from dermatology) due to "being on too many medications" - Previously on lisinopril/HCTZ, but lisinopril was d/c due to her contemplation of pregnancy.   Current blood pressure readings readings: not checking at home  Hypercholesterolemia: Current medications: rosuvastatin 20 mg     Objective: Lab Results  Component Value Date   HGBA1C 10.7 (H) 03/02/2021    Lab Results  Component Value Date   CREATININE 0.72 11/24/2020   BUN 13 11/24/2020   NA 133 (L) 11/24/2020   K 3.7 11/24/2020   CL 98 11/24/2020   CO2 27 11/24/2020    Lab Results  Component Value Date   CHOL 82 08/06/2020   HDL 42.30 08/06/2020   LDLCALC 25 08/06/2020    LDLDIRECT 23.0 01/21/2020   TRIG 75.0 08/06/2020   CHOLHDL 2 08/06/2020    Medications Reviewed Today     Reviewed by De Hollingshead, RPH-CPP (Pharmacist) on 03/24/21 at 1104  Med List Status: <None>   Medication Order Taking? Sig Documenting Provider Last Dose Status Informant  clobetasol ointment (TEMOVATE) 0.05 % [id number]  Apply to stubborn areas of Eczema  For 2 weeks, then only on weekends. Avoid Face, groin, and under arms Moye, [place], MD  Active   Dapsone (ACZONE) 7.5 % GEL [id number]  Apply small amount once daily to affected areas [facility], [place], MD  Active   fluticasone (CUTIVATE) 0.05 % cream [id number]  APPLY TO AFFECTED AREA UP TO TWICE A DAY AS NEEDED FOR RASH/ITCHING Leone Haven, MD  Active   glucose monitoring kit (FREESTYLE) monitoring kit [id number]  Use as directed to check sugars bid Jackolyn Confer, MD  Active   hydrochlorothiazide (MICROZIDE) 12.5 MG capsule [id number] Yes TAKE 1 CAPSULE BY MOUTH EVERY DAY Leone Haven, MD Taking Active   hydrocortisone 2.5 % cream [id number]  Apply topically 2 (two) times daily as needed (Rash). To face up to 2 weeks Moye, [place], MD  Active   metFORMIN (GLUCOPHAGE-XR) 500 MG 24 hr tablet [id number] Yes TAKE 2 TABLETS BY MOUTH TWICE A DAY Leone Haven, MD Taking Active            Med Note Darnelle Maffucci, Arville Lime   Fri Sep 17, 2020  1:41 PM)    norethindrone (CAMILA) 0.35 MG tablet [id number] Yes Take 1 tablet (0.35 mg total) by mouth daily.  Leone Haven, MD Taking Active   rosuvastatin (CRESTOR) 20 MG tablet [id number] Yes TAKE 1 TABLET BY MOUTH EVERY DAY Leone Haven, MD Taking Active   Semaglutide-Weight Management [facility]) 2.4 MG/0.75ML Darden Palmer [id number] Yes Inject 2.4 mg into the skin once a week. Briscoe Deutscher, DO Taking Active   spironolactone (ALDACTONE) 100 MG tablet [id number] No TAKE 1 TABLET BY MOUTH EVERY DAY  Patient not taking: Reported on 03/24/2021   Brendolyn Patty, MD Not Taking Active              Assessment/Plan:   Diabetes: - Currently uncontrolled - Reviewed long term cardiovascular and renal outcomes of uncontrolled blood sugar - Reviewed goal A1c, goal fasting, and goal 2 hour post prandial glucose - SURPASS trial did demonstrate improved A1c reduction vs Ozempic 1 mg, however, unlikely to be significant enough to get patient's a1c to goal, especially considering pending plans for bariatric surgery and need for tighter glucose control - Extensive conversation about need to start basal insulin today. Patient hesitant. Motivational interviewing provided. We agreed for me to send a prescription for Tresiba to the pharmacy to see what the copay will be. She will also discuss with Dr. Juleen China next week.  - Discussed Libre 3. Patient interested. Script sent.  - Recommend to start Tresiba 10 units once daily. Continue Wegovy 2.4 mg weekly, metformin XR 1000 mg twice dialy.  - Recommend to check glucose twice daily.  Hypertension: - Currently controlled - Reviewed long term cardiovascular and renal outcomes of uncontrolled blood pressure - Advise to consider reinitiation of RAAS agent moving forward if patient is not actively contemplating pregnancy (still on contraception), especially if any albuminuria moving forward.   Hyperlipidemia/ASCVD Risk Reduction: - Currently controlled.  - Recommend to continue current regimen at this time   Follow Up Plan: pending plan after appointment with Dr. Monika Salk, PharmD, BCACP, CPP Clinical Pharmacist [place] [facility] [phone]

## 2021-03-29 ENCOUNTER — Other Ambulatory Visit: Payer: Self-pay

## 2021-03-29 ENCOUNTER — Ambulatory Visit (INDEPENDENT_AMBULATORY_CARE_PROVIDER_SITE_OTHER): Payer: BC Managed Care – PPO | Admitting: Family Medicine

## 2021-03-29 ENCOUNTER — Encounter (INDEPENDENT_AMBULATORY_CARE_PROVIDER_SITE_OTHER): Payer: Self-pay | Admitting: Family Medicine

## 2021-03-29 VITALS — BP 121/74 | HR 107 | Temp 98.0°F | Ht 65.0 in | Wt 271.0 lb

## 2021-03-29 DIAGNOSIS — E1169 Type 2 diabetes mellitus with other specified complication: Secondary | ICD-10-CM

## 2021-03-29 DIAGNOSIS — E669 Obesity, unspecified: Secondary | ICD-10-CM

## 2021-03-29 DIAGNOSIS — F5081 Binge eating disorder: Secondary | ICD-10-CM | POA: Diagnosis not present

## 2021-03-29 DIAGNOSIS — Z6841 Body Mass Index (BMI) 40.0 and over, adult: Secondary | ICD-10-CM

## 2021-03-29 DIAGNOSIS — F3289 Other specified depressive episodes: Secondary | ICD-10-CM | POA: Diagnosis not present

## 2021-03-30 MED ORDER — GLIPIZIDE 5 MG PO TABS: 5.0000 mg | ORAL_TABLET | Freq: Two times a day (BID) | ORAL | 3 refills | Status: DC

## 2021-03-30 NOTE — Progress Notes (Signed)
Chief Complaint:   OBESITY Veronica Norton is here to discuss her progress with her obesity treatment plan along with follow-up of her obesity related diagnoses. See Medical Weight Management Flowsheet for complete bioelectrical impedance results.  Today's visit was #: 87 Starting weight: 284 lbs Starting date: 02/03/2019 Weight change since last visit: 1 lb Total lbs lost to date: 13 lbs Total weight loss percentage to date: -4.58%  Nutrition Plan: Category 1 Plan for 50% of the time.  Activity: Walking for 30-45 minutes 2 times per week.   Interim History: Veronica Norton still needs to pay her balance at her surgeon's office in order to schedule surgery.  Her last A1c showed an increase to 10.7 from 8.9.  She endorses daily sweets intake, emotional eating.  She refuses insulin, which was recently prescribed.  Assessment/Plan:   1. Diabetes mellitus type 2 in obese (HCC) Diabetes Mellitus: Not at goal. Medication: Tresiba 10 units daily (titrate to a max of 30 if needed), metformin XR 1000 mg twice daily, and Wegovy 2.4 mg subcutaneously weekly. Issues reviewed: blood sugar goals, complications of diabetes mellitus, hypoglycemia prevention and treatment, exercise, and nutrition.  Plan:  Start glipizide 5 mg twice daily to help lower A1c.  She will restart CGM and pursue bariatric surgery.  Continue other medications as prescribed.  The patient will continue to focus on protein-rich, low simple carbohydrate foods. We reviewed the importance of hydration, regular exercise for stress reduction, and restorative sleep.   Lab Results  Component Value Date   HGBA1C 10.7 (H) 03/02/2021   HGBA1C 8.9 (A) 11/24/2020   HGBA1C 9.6 (H) 08/06/2020   Lab Results  Component Value Date   MICROALBUR 1.0 03/02/2021   LDLCALC 25 08/06/2020   CREATININE 0.72 11/24/2020   - Refill glipiZIDE (GLUCOTROL) 5 MG tablet; Take 1 tablet (5 mg total) by mouth 2 (two) times daily before a meal.  Dispense: 60 tablet;  Refill: 3  2. Binge eating disorder, ADHD Not at goal. Medication:  None.  Counseling ADHD is the most missed diagnosis in relation to food and appetite problems. Often the strong urge to binge or to self-medicate with food subsides once the impulsivity and inattention of ADHD are treated. A person can experience a new ability to tune in to the body's signals, control cravings and improve impulse control.  A deficiency in norepinephrine and dopamine can lead to the following behaviors related to eating: Poor awareness of internal cues of hunger and satiety, or fullness. Inability to follow a meal plan. Inability to judge portion size accurately. Inability to stop bingeing or purging. Distraction by continual thoughts of food, weight and body shape. Increased desire to overeat, especially high calorie, "reward" type foods. Poor self-esteem due to repeated failures of self-control.  Plan: Start Vyvanse 10 mg daily, dispense 30, 0 refills.  I have consulted the  Controlled Substances Registry for this patient, and feel the risk/benefit ratio today is favorable for proceeding with this prescription for a controlled substance. The patient understands monitoring parameters and red flags.   3. Other depression Behavior modification techniques were discussed today to help Veronica Norton deal with her emotional/non-hunger eating behaviors.     4. Obesity with current BMI 45.2  Course: Veronica Norton is currently in the action stage of change. As such, her goal is to continue with weight loss efforts.   Nutrition goals: She has agreed to the Category 1 Plan.   Exercise goals:  As is.  Behavioral modification strategies: increasing lean protein  intake, decreasing simple carbohydrates, increasing vegetables, increasing water intake, decreasing liquid calories, decreasing sodium intake, increasing high fiber foods, and emotional eating strategies.  [name] has agreed to follow-up with our clinic in 4 weeks. She  was informed of the importance of frequent follow-up visits to maximize her success with intensive lifestyle modifications for her multiple health conditions.   Objective:   Blood pressure 121/74, pulse (!) 107, temperature 98 F (36.7 C), height 5\' 5"  (1.651 m), weight 271 lb (122.9 kg), last menstrual period 03/02/2021, SpO2 97 %. Body mass index is 45.1 kg/m.  General: Cooperative, alert, well developed, in no acute distress. HEENT: Conjunctivae and lids unremarkable. Cardiovascular: Regular rhythm.  Lungs: Normal work of breathing. Neurologic: No focal deficits.   Lab Results  Component Value Date   CREATININE 0.72 11/24/2020   BUN 13 11/24/2020   NA 133 (L) 11/24/2020   K 3.7 11/24/2020   CL 98 11/24/2020   CO2 27 11/24/2020   Lab Results  Component Value Date   ALT 19 08/06/2020   AST 10 08/06/2020   ALKPHOS 85 08/06/2020   BILITOT 0.4 08/06/2020   Lab Results  Component Value Date   HGBA1C 10.7 (H) 03/02/2021   HGBA1C 8.9 (A) 11/24/2020   HGBA1C 9.6 (H) 08/06/2020   HGBA1C 7.6 (H) 02/27/2020   HGBA1C 7.3 (H) 11/26/2019   Lab Results  Component Value Date   TSH 1.08 03/02/2021   Lab Results  Component Value Date   CHOL 82 08/06/2020   HDL 42.30 08/06/2020   LDLCALC 25 08/06/2020   LDLDIRECT 23.0 01/21/2020   TRIG 75.0 08/06/2020   CHOLHDL 2 08/06/2020   Lab Results  Component Value Date   VD25OH 22.62 (L) 05/07/2020   VD25OH 22.97 (L) 02/27/2020   VD25OH 40.6 05/05/2019   Lab Results  Component Value Date   WBC 7.3 03/02/2021   HGB 14.5 03/02/2021   HCT 42.8 03/02/2021   MCV 96.8 03/02/2021   PLT 358.0 03/02/2021   Attestation Statements:   Reviewed by clinician on day of visit: allergies, medications, problem list, medical history, surgical history, family history, social history, and previous encounter notes.  I, Water quality scientist, CMA, am acting as transcriptionist for Briscoe Deutscher, DO  I have reviewed the above documentation for accuracy  and completeness, and I agree with the above. -  Briscoe Deutscher, DO, MS, FAAFP, DABOM - Family and Bariatric Medicine.

## 2021-03-31 MED ORDER — LISDEXAMFETAMINE DIMESYLATE 10 MG PO CAPS: 10.0000 mg | ORAL_CAPSULE | Freq: Every day | ORAL | 0 refills | Status: DC

## 2021-04-06 ENCOUNTER — Other Ambulatory Visit: Payer: Self-pay

## 2021-04-06 ENCOUNTER — Ambulatory Visit (INDEPENDENT_AMBULATORY_CARE_PROVIDER_SITE_OTHER): Payer: BC Managed Care – PPO | Admitting: *Deleted

## 2021-04-06 DIAGNOSIS — Z111 Encounter for screening for respiratory tuberculosis: Secondary | ICD-10-CM

## 2021-04-08 ENCOUNTER — Ambulatory Visit: Payer: BC Managed Care – PPO | Admitting: *Deleted

## 2021-04-08 ENCOUNTER — Other Ambulatory Visit: Payer: Self-pay

## 2021-04-08 DIAGNOSIS — Z111 Encounter for screening for respiratory tuberculosis: Secondary | ICD-10-CM

## 2021-04-08 LAB — TB SKIN TEST
Induration: 0 mm
TB Skin Test: NEGATIVE

## 2021-04-08 NOTE — Progress Notes (Signed)
Negative TB skin test.

## 2021-04-08 NOTE — Progress Notes (Signed)
PPD read

## 2021-04-16 ENCOUNTER — Other Ambulatory Visit: Payer: Self-pay | Admitting: Family Medicine

## 2021-04-17 ENCOUNTER — Other Ambulatory Visit (INDEPENDENT_AMBULATORY_CARE_PROVIDER_SITE_OTHER): Payer: Self-pay | Admitting: Family Medicine

## 2021-04-17 DIAGNOSIS — E1169 Type 2 diabetes mellitus with other specified complication: Secondary | ICD-10-CM

## 2021-04-17 DIAGNOSIS — E669 Obesity, unspecified: Secondary | ICD-10-CM

## 2021-04-19 ENCOUNTER — Encounter (INDEPENDENT_AMBULATORY_CARE_PROVIDER_SITE_OTHER): Payer: Self-pay | Admitting: Family Medicine

## 2021-04-19 ENCOUNTER — Ambulatory Visit (INDEPENDENT_AMBULATORY_CARE_PROVIDER_SITE_OTHER): Payer: BC Managed Care – PPO | Admitting: Family Medicine

## 2021-04-19 ENCOUNTER — Other Ambulatory Visit: Payer: Self-pay

## 2021-04-19 VITALS — BP 124/78 | HR 86 | Temp 97.9°F | Ht 65.0 in | Wt 267.0 lb

## 2021-04-19 DIAGNOSIS — Z7984 Long term (current) use of oral hypoglycemic drugs: Secondary | ICD-10-CM

## 2021-04-19 DIAGNOSIS — E66813 Obesity, class 3: Secondary | ICD-10-CM

## 2021-04-19 DIAGNOSIS — E785 Hyperlipidemia, unspecified: Secondary | ICD-10-CM | POA: Diagnosis not present

## 2021-04-19 DIAGNOSIS — E1169 Type 2 diabetes mellitus with other specified complication: Secondary | ICD-10-CM | POA: Diagnosis not present

## 2021-04-19 DIAGNOSIS — E669 Obesity, unspecified: Secondary | ICD-10-CM

## 2021-04-19 DIAGNOSIS — E1159 Type 2 diabetes mellitus with other circulatory complications: Secondary | ICD-10-CM

## 2021-04-19 DIAGNOSIS — I152 Hypertension secondary to endocrine disorders: Secondary | ICD-10-CM

## 2021-04-19 DIAGNOSIS — Z6841 Body Mass Index (BMI) 40.0 and over, adult: Secondary | ICD-10-CM

## 2021-04-20 NOTE — Progress Notes (Signed)
Chief Complaint:   OBESITY Veronica Norton is here to discuss her progress with her obesity treatment plan along with follow-up of her obesity related diagnoses. See Medical Weight Management Flowsheet for complete bioelectrical impedance results.  Today's visit was #: 1 Starting weight: 284 lbs Starting date: 02/03/2019 Weight change since last visit: 4 lbs Total lbs lost to date: 17 lbs Total weight loss percentage to date: -5.99%  Nutrition Plan: Category 1 Plan for 50% of the time.  Activity: Walking more. Anti-obesity medications: Wegovy 2.4 mg subcutaneously weekly. Reported side effects: None.  Interim History: Jaquita says she is still paying down her debt to afford surgery.  She is taking glipizide.  She is not checking her blood sugars.  Assessment/Plan:   1. Type 2 diabetes mellitus with other specified complication, without long-term current use of insulin (HCC) Diabetes Mellitus: Not at goal. Medication: Wegovy 2.4 mg subcutaneously weekly, metformin XR 1000 mg twice daily, glipizide 5 mg twice daily. Issues reviewed: blood sugar goals, complications of diabetes mellitus, hypoglycemia prevention and treatment, exercise, and nutrition.  Plan:  Continue medications as prescribed. The patient will continue to focus on protein-rich, low simple carbohydrate foods. We reviewed the importance of hydration, regular exercise for stress reduction, and restorative sleep.   Lab Results  Component Value Date   HGBA1C 10.7 (H) 03/02/2021   HGBA1C 8.9 (A) 11/24/2020   HGBA1C 9.6 (H) 08/06/2020   Lab Results  Component Value Date   MICROALBUR 1.0 03/02/2021   LDLCALC 25 08/06/2020   CREATININE 0.72 11/24/2020   2. Hyperlipidemia associated with type 2 diabetes mellitus ([place]) Course: Controlled. Lipid-lowering medications: Crestor 20 mg daily.   Plan:  Dietary changes: Increase soluble fiber, decrease simple carbohydrates, decrease saturated fat. Exercise changes: Moderate to  vigorous-intensity aerobic activity 150 minutes per week or as tolerated. We will continue to monitor along with PCP/specialists as it pertains to her weight loss journey.  Lab Results  Component Value Date   CHOL 82 08/06/2020   HDL 42.30 08/06/2020   LDLCALC 25 08/06/2020   LDLDIRECT 23.0 01/21/2020   TRIG 75.0 08/06/2020   CHOLHDL 2 08/06/2020   Lab Results  Component Value Date   ALT 19 08/06/2020   AST 10 08/06/2020   ALKPHOS 85 08/06/2020   BILITOT 0.4 08/06/2020   3. Hypertension associated with diabetes ([place]) At goal. Medications: HCTZ 12.5 mg daily.   Plan: Avoid buying foods that are: processed, frozen, or prepackaged to avoid excess salt. We will watch for signs of hypotension as she continues lifestyle modifications.  BP Readings from Last 3 Encounters:  04/19/21 124/78  03/29/21 121/74  03/02/21 130/80   Lab Results  Component Value Date   CREATININE 0.72 11/24/2020   4. Obesity with current BMI 44.4  Course: [name] is currently in the action stage of change. As such, her goal is to continue with weight loss efforts.   Nutrition goals: She has agreed to the Category 1 Plan.   Exercise goals:  As is.  Behavioral modification strategies: increasing lean protein intake, decreasing simple carbohydrates, increasing vegetables, and increasing water intake.  Veronica Norton has agreed to follow-up with our clinic in 4 weeks. She was informed of the importance of frequent follow-up visits to maximize her success with intensive lifestyle modifications for her multiple health conditions.   Objective:   Blood pressure 124/78, pulse 86, temperature 97.9 F (36.6 C), temperature source Oral, height 5\' 5"  (1.651 m), weight 267 lb (121.1 kg), SpO2 98 %.  Body mass index is 44.43 kg/m.  General: Cooperative, alert, well developed, in no acute distress. HEENT: Conjunctivae and lids unremarkable. Cardiovascular: Regular rhythm.  Lungs: Normal work of breathing. Neurologic: No  focal deficits.   Lab Results  Component Value Date   CREATININE 0.72 11/24/2020   BUN 13 11/24/2020   NA 133 (L) 11/24/2020   K 3.7 11/24/2020   CL 98 11/24/2020   CO2 27 11/24/2020   Lab Results  Component Value Date   ALT 19 08/06/2020   AST 10 08/06/2020   ALKPHOS 85 08/06/2020   BILITOT 0.4 08/06/2020   Lab Results  Component Value Date   HGBA1C 10.7 (H) 03/02/2021   HGBA1C 8.9 (A) 11/24/2020   HGBA1C 9.6 (H) 08/06/2020   HGBA1C 7.6 (H) 02/27/2020   HGBA1C 7.3 (H) 11/26/2019   Lab Results  Component Value Date   TSH 1.08 03/02/2021   Lab Results  Component Value Date   CHOL 82 08/06/2020   HDL 42.30 08/06/2020   LDLCALC 25 08/06/2020   LDLDIRECT 23.0 01/21/2020   TRIG 75.0 08/06/2020   CHOLHDL 2 08/06/2020   Lab Results  Component Value Date   VD25OH 22.62 (L) 05/07/2020   VD25OH 22.97 (L) 02/27/2020   VD25OH 40.6 05/05/2019   Lab Results  Component Value Date   WBC 7.3 03/02/2021   HGB 14.5 03/02/2021   HCT 42.8 03/02/2021   MCV 96.8 03/02/2021   PLT 358.0 03/02/2021   Attestation Statements:   Reviewed by clinician on day of visit: allergies, medications, problem list, medical history, surgical history, family history, social history, and previous encounter notes.  I, Water quality scientist, CMA, am acting as transcriptionist for Briscoe Deutscher, DO  I have reviewed the above documentation for accuracy and completeness, and I agree with the above. -  Briscoe Deutscher, DO, MS, FAAFP, DABOM - Family and Bariatric Medicine.

## 2021-05-17 ENCOUNTER — Ambulatory Visit: Payer: Self-pay | Admitting: Pharmacist

## 2021-05-17 NOTE — Chronic Care Management (AMB) (Signed)
?  Chronic Care Management  ? ?Note ? ?05/17/2021 ?Name: Veronica Norton MRN: [id number] DOB: [birth year] ? ? ? ?Closing pharmacy CCM case at this time. Will collaborate with Care Guide to outreach to schedule follow up with RN CM. Patient has clinic contact information for future questions or concerns.  ? ?Catie Darnelle Maffucci, PharmD, [place], CPP ?Clinical Pharmacist ?Therapist, music at [employer] ?[phone] ? ?

## 2021-05-19 ENCOUNTER — Ambulatory Visit (INDEPENDENT_AMBULATORY_CARE_PROVIDER_SITE_OTHER): Payer: BC Managed Care – PPO | Admitting: Family Medicine

## 2021-05-19 ENCOUNTER — Encounter (INDEPENDENT_AMBULATORY_CARE_PROVIDER_SITE_OTHER): Payer: Self-pay | Admitting: Family Medicine

## 2021-05-19 VITALS — BP 128/85 | HR 84 | Temp 98.1°F | Ht 65.0 in | Wt 271.0 lb

## 2021-05-19 DIAGNOSIS — I152 Hypertension secondary to endocrine disorders: Secondary | ICD-10-CM | POA: Diagnosis not present

## 2021-05-19 DIAGNOSIS — E785 Hyperlipidemia, unspecified: Secondary | ICD-10-CM | POA: Diagnosis not present

## 2021-05-19 DIAGNOSIS — Z9189 Other specified personal risk factors, not elsewhere classified: Secondary | ICD-10-CM

## 2021-05-19 DIAGNOSIS — E1169 Type 2 diabetes mellitus with other specified complication: Secondary | ICD-10-CM | POA: Diagnosis not present

## 2021-05-19 DIAGNOSIS — E1159 Type 2 diabetes mellitus with other circulatory complications: Secondary | ICD-10-CM | POA: Diagnosis not present

## 2021-05-19 DIAGNOSIS — Z6841 Body Mass Index (BMI) 40.0 and over, adult: Secondary | ICD-10-CM

## 2021-05-19 DIAGNOSIS — E669 Obesity, unspecified: Secondary | ICD-10-CM

## 2021-05-23 NOTE — Progress Notes (Signed)
? ? ? ?Chief Complaint:  ? ?OBESITY ?Veronica Norton is here to discuss her progress with her obesity treatment plan along with follow-up of her obesity related diagnoses. Veronica Norton is on the Category 1 Plan and states Veronica Norton is following her eating plan approximately 60-65% of the time. Veronica Norton states Veronica Norton is walking for 45 minutes 2 times per week. ? ?Today's visit was #: 76 ?Starting weight: 284 lbs ?Starting date: 02/03/2019 ?Today's weight: 271 lbs ?Today's date: 05/19/2021 ?Total lbs lost to date: 13 lbs ?Total lbs lost since last in-office visit: 0 ? ?Interim History:  ?- Veronica Norton recently had a birthday and hasn't been following the meal plan.  ? ?- Veronica Norton has gained 4 pounds since her last visit: but specifically lost 2 lbs in muscle and gained 6.4 lbs in fat mass.   ? ?- Veronica Norton says Veronica Norton drank more ETOH and did more off plan eating than usual.  ? ? Veronica Norton is getting in about 8 ounces of protein for lunch.   ? ?Today, Veronica Norton denies the need for a refill of any medications that we prescribe ? ? ?Subjective:  ? ?1. Type 2 diabetes mellitus with other specified complication, without long-term current use of insulin ([place]) ?Last A1c was greater than 10 around 3 months ago.  Treatment per PCP Dr. Caryl Bis.  Veronica Norton is taking Wegovy 2.4 mg , glipizide, and metformin. Continues to eat poorly nutritious foods.  ? ?2. Hypertension associated with type 2 diabetes mellitus ([place]) ?Veronica Norton is taking Aldactone 100 mg daily and HCTZ 12.5 mg daily.  Review: taking medications as instructed, no medication side effects noted, no chest pain on exertion, no dyspnea on exertion, no swelling of ankles.  ? ?BP Readings from Last 3 Encounters:  ?05/19/21 128/85  ?04/19/21 124/78  ?03/29/21 121/74  ? ?3. Hyperlipidemia associated with type 2 diabetes mellitus ([place]) ?Veronica Norton has hyperlipidemia and has been trying to improve her cholesterol levels with intensive lifestyle modifications including a low saturated fat diet, exercise, and weight loss. Veronica Norton denies any chest pain,  claudication, or myalgias. Veronica Norton is taking Crestor 20 mg daily. ? ?4. At risk for heart disease ?Veronica Norton is at higher than average risk for cardiovascular disease due to poorly controlled diabetes with A1c of >10. ? ? ? ?Assessment/Plan:  ? ?1. Type 2 diabetes mellitus with other specified complication, without long-term current use of insulin ([place]) ?A1c Very poorly controlled ?Lab Results  ?Component Value Date  ? HGBA1C 10.7 (H) 03/02/2021  ? HGBA1C 8.9 (A) 11/24/2020  ? HGBA1C 9.6 (H) 08/06/2020  ? ?-Long discussion was had with the patient regarding eating better for her health and wellbeing.   ?-Veronica Norton needs to avoid skipping meals, increase her protein intake, and decrease simple carbs. ? ?2. Hypertension associated with type 2 diabetes mellitus ([place]) ?At goal.  Continue Aldactone and HCTZ. ? ?3. Hyperlipidemia associated with type 2 diabetes mellitus ([place]) ?Cardiovascular risk and specific lipid/LDL goals reviewed.  We discussed several lifestyle modifications today and Veronica Norton will continue to work on diet, exercise and weight loss efforts. Orders and follow up as documented in patient record. Continue Crestor. ? ?Counseling ?Intensive lifestyle modifications are the first line treatment for this issue. ?Dietary changes: Increase soluble fiber. Decrease simple carbohydrates. ?Exercise changes: Moderate to vigorous-intensity aerobic activity 150 minutes per week if tolerated. ?Lipid-lowering medications: see documented in medical record. ? ?4. At risk for heart disease ?Due to Veronica Norton's current state of health and medical condition(s), Veronica Norton is at a higher risk for heart disease.  This puts the patient at much greater risk to subsequently develop cardiopulmonary conditions that can significantly affect patient's quality of life in a negative manner.   ? ?At least 15 minutes were spent on counseling Veronica Norton about these concerns today, and I stressed the importance of reversing risks factors of obesity, especially truncal and  visceral fat, hypertension, hyperlipidemia, and pre-diabetes.  The initial goal is to lose at least 5-10% of starting weight to help reduce these risk factors.  Counseling:  Intensive lifestyle modifications were discussed with Veronica Norton as the most appropriate first line of treatment.  Veronica Norton will continue to work on diet, exercise, and weight loss efforts.  We will continue to reassess these conditions on a fairly regular basis in an attempt to decrease the patient's overall morbidity and mortality. ? ?Evidence-based interventions for health behavior change were utilized today including the discussion of self monitoring techniques, problem-solving barriers, and SMART goal setting techniques.  Specifically, regarding patient's less desirable eating habits and patterns, we employed the technique of small changes when Veronica Norton has not been able to fully commit to her prudent nutritional plan. ? ?5. Obesity with current BMI of 45.2 ? ?Veronica Norton is currently in the action stage of change. As such, her goal is to continue with weight loss efforts. Veronica Norton has agreed to the Category 1 Plan.  ? ?Monday - Thursday, patient will try to follow meal plan.   ? ?-> Handouts on Category 1 given again to pt and meal plan reviewed with patient again in detail. ? ?Exercise goals:  As is. ? ?Behavioral modification strategies: increasing lean protein intake, decreasing simple carbohydrates, meal planning and cooking strategies, and planning for success. ? ?Veronica Norton has agreed to follow-up with our clinic in 3 weeks with Veronica Norton. Veronica Norton was informed of the importance of frequent follow-up visits to maximize her success with intensive lifestyle modifications for her multiple health conditions.  ? ?Objective:  ? ?Blood pressure 128/85, pulse 84, temperature 98.1 ?F (36.7 ?C), temperature source Oral, height '5\' 5"'$  (1.651 m), weight 271 lb (122.9 kg), last menstrual period 05/17/2021, SpO2 98 %. ?Body mass index is 45.1 kg/m?. ? ?General: Cooperative,  alert, well developed, in no acute distress. ?HEENT: Conjunctivae and lids unremarkable. ?Cardiovascular: Regular rhythm.  ?Lungs: Normal work of breathing. ?Neurologic: No focal deficits.  ? ?Lab Results  ?Component Value Date  ? CREATININE 0.72 11/24/2020  ? BUN 13 11/24/2020  ? NA 133 (L) 11/24/2020  ? K 3.7 11/24/2020  ? CL 98 11/24/2020  ? CO2 27 11/24/2020  ? ?Lab Results  ?Component Value Date  ? ALT 19 08/06/2020  ? AST 10 08/06/2020  ? ALKPHOS 85 08/06/2020  ? BILITOT 0.4 08/06/2020  ? ?Lab Results  ?Component Value Date  ? HGBA1C 10.7 (H) 03/02/2021  ? HGBA1C 8.9 (A) 11/24/2020  ? HGBA1C 9.6 (H) 08/06/2020  ? HGBA1C 7.6 (H) 02/27/2020  ? HGBA1C 7.3 (H) 11/26/2019  ? ?Lab Results  ?Component Value Date  ? TSH 1.08 03/02/2021  ? ?Lab Results  ?Component Value Date  ? CHOL 82 08/06/2020  ? HDL 42.30 08/06/2020  ? [place] 25 08/06/2020  ? LDLDIRECT 23.0 01/21/2020  ? TRIG 75.0 08/06/2020  ? CHOLHDL 2 08/06/2020  ? ?Lab Results  ?Component Value Date  ? VD25OH 22.62 (L) 05/07/2020  ? VD25OH 22.97 (L) 02/27/2020  ? VD25OH 40.6 05/05/2019  ? ?Lab Results  ?Component Value Date  ? WBC 7.3 03/02/2021  ? HGB 14.5 03/02/2021  ? HCT 42.8 03/02/2021  ?  MCV 96.8 03/02/2021  ? PLT 358.0 03/02/2021  ? ?Attestation Statements:  ? ?Reviewed by clinician on day of visit: allergies, medications, problem list, medical history, surgical history, family history, social history, and previous encounter notes. ? ?I, Water quality scientist, CMA, am acting as transcriptionist for [employer], DO. ? ?I have reviewed the above documentation for accuracy and completeness, and I agree with the above. Marjory Sneddon, D.O. ? ?The [place] was signed into law in 2016 which includes the topic of electronic health records.  This provides immediate access to information in MyChart.  This includes consultation notes, operative notes, office notes, lab results and pathology reports.  If you have any questions about what you read  please let us know at your next visit so we can discuss your concerns and take corrective action if need be.  We are right here with you. ? ?

## 2021-06-10 ENCOUNTER — Encounter (INDEPENDENT_AMBULATORY_CARE_PROVIDER_SITE_OTHER): Payer: Self-pay | Admitting: Family Medicine

## 2021-06-13 ENCOUNTER — Ambulatory Visit (INDEPENDENT_AMBULATORY_CARE_PROVIDER_SITE_OTHER): Payer: BC Managed Care – PPO | Admitting: Family Medicine

## 2021-06-13 NOTE — Telephone Encounter (Signed)
Dr.Opalski ?

## 2021-07-01 ENCOUNTER — Ambulatory Visit: Payer: BC Managed Care – PPO | Admitting: Family Medicine

## 2021-07-01 ENCOUNTER — Encounter: Payer: Self-pay | Admitting: Family Medicine

## 2021-07-01 VITALS — BP 130/70 | HR 64 | Temp 98.6°F | Ht 65.0 in | Wt 274.6 lb

## 2021-07-01 DIAGNOSIS — Z1211 Encounter for screening for malignant neoplasm of colon: Secondary | ICD-10-CM

## 2021-07-01 DIAGNOSIS — E1159 Type 2 diabetes mellitus with other circulatory complications: Secondary | ICD-10-CM | POA: Diagnosis not present

## 2021-07-01 DIAGNOSIS — E1169 Type 2 diabetes mellitus with other specified complication: Secondary | ICD-10-CM

## 2021-07-01 DIAGNOSIS — E669 Obesity, unspecified: Secondary | ICD-10-CM | POA: Diagnosis not present

## 2021-07-01 DIAGNOSIS — I152 Hypertension secondary to endocrine disorders: Secondary | ICD-10-CM

## 2021-07-01 LAB — BASIC METABOLIC PANEL
BUN: 12 mg/dL (ref 6–23)
CO2: 26 mEq/L (ref 19–32)
Calcium: 8.8 mg/dL (ref 8.4–10.5)
Chloride: 98 mEq/L (ref 96–112)
Creatinine, Ser: 0.68 mg/dL (ref 0.40–1.20)
GFR: 105.4 mL/min (ref >60.00)
Glucose, Bld: 246 mg/dL — ABNORMAL HIGH (ref 70–99)
Potassium: 3.9 mEq/L (ref 3.5–5.1)
Sodium: 131 mEq/L — ABNORMAL LOW (ref 135–145)

## 2021-07-01 LAB — HEMOGLOBIN A1C: Hgb A1c MFr Bld: 10 % — ABNORMAL HIGH (ref 4.6–6.5)

## 2021-07-01 NOTE — Progress Notes (Signed)
?Tommi Rumps, MD ?Phone: [phone] ? ?Veronica Norton is a 46 y.o. female who presents today for f/u. ? ?HYPERTENSION ?Disease Monitoring ?Home BP Monitoring not checking Chest pain- no    Dyspnea- no ?Medications ?Compliance-  taking HCTZ, spironolactone.   Edema- no ?BMET ?   ?Component Value Date/Time  ? NA 133 (L) 11/24/2020 1520  ? NA 137 08/18/2019 1712  ? K 3.7 11/24/2020 1520  ? CL 98 11/24/2020 1520  ? CO2 27 11/24/2020 1520  ? GLUCOSE 225 (H) 11/24/2020 1520  ? BUN 13 11/24/2020 1520  ? BUN 13 08/18/2019 1712  ? CREATININE 0.72 11/24/2020 1520  ? CREATININE 0.67 02/06/2018 1506  ? CALCIUM 8.9 11/24/2020 1520  ? GFRNONAA 101 08/18/2019 1712  ? GFRAA 117 08/18/2019 1712  ? ?DIABETES ?Disease Monitoring: ?Blood Sugar ranges-not checking Polyuria/phagia/dipsia- no      Optho- UTD ?Medications: ?Compliance- taking wegovy (though her insurance is no longer going to pay for this), metformin, glipizide Hypoglycemic symptoms- no ? ?Obesity: She continues to follow with weight management clinic.  She has been doing some meal prepping.  She is limiting heavy foods.  Eating lots of sandwiches.  She does get fruits and vegetables.  She is walking some for exercise. ? ? ?Social History  ? ?Tobacco Use  ?Smoking Status Never  ?Smokeless Tobacco Never  ? ? ?Current Outpatient Medications on File Prior to Visit  ?Medication Sig Dispense Refill  ? clobetasol ointment (TEMOVATE) 0.05 % Apply to stubborn areas of Eczema  For 2 weeks, then only on weekends. Avoid Face, groin, and under arms 45 g 3  ? Continuous Blood Gluc Sensor (FREESTYLE LIBRE 3 SENSOR) MISC Apply 1 each topically every 14 (fourteen) days. Place 1 sensor on the skin every 14 days. Use to check glucose continuously 2 each 11  ? Dapsone (ACZONE) 7.5 % GEL Apply small amount once daily to affected areas 60 g 2  ? fluticasone (CUTIVATE) 0.05 % cream APPLY TO AFFECTED AREA UP TO TWICE A DAY AS NEEDED FOR RASH/ITCHING 60 g 0  ? glipiZIDE (GLUCOTROL) 5  MG tablet Take 1 tablet (5 mg total) by mouth 2 (two) times daily before a meal. 60 tablet 3  ? glucose monitoring kit (FREESTYLE) monitoring kit Use as directed to check sugars bid 1 each 0  ? hydrochlorothiazide (MICROZIDE) 12.5 MG capsule TAKE 1 CAPSULE BY MOUTH EVERY DAY 90 capsule 1  ? hydrocortisone 2.5 % cream Apply topically 2 (two) times daily as needed (Rash). To face up to 2 weeks 30 g 1  ? lisdexamfetamine (VYVANSE) 10 MG capsule Take 1 capsule (10 mg total) by mouth daily. 30 capsule 0  ? metFORMIN (GLUCOPHAGE-XR) 500 MG 24 hr tablet TAKE 2 TABLETS BY MOUTH TWICE A DAY 360 tablet 1  ? norethindrone (CAMILA) 0.35 MG tablet Take 1 tablet (0.35 mg total) by mouth daily. 28 tablet 6  ? rosuvastatin (CRESTOR) 20 MG tablet TAKE 1 TABLET BY MOUTH EVERY DAY 90 tablet 3  ? Semaglutide-Weight Management (WEGOVY) 2.4 MG/0.75ML SOAJ Inject 2.4 mg into the skin.    ? spironolactone (ALDACTONE) 100 MG tablet TAKE 1 TABLET BY MOUTH EVERY DAY 90 tablet 0  ? ?No current facility-administered medications on file prior to visit.  ? ? ? ?ROS see history of present illness ? ?Objective ? ?Physical Exam ?Vitals:  ? 07/01/21 1422  ?BP: 130/70  ?Pulse: 64  ?Temp: 98.6 ?F (37 ?C)  ?SpO2: 99%  ? ? ?BP Readings from Last 3  Encounters:  ?07/01/21 130/70  ?05/19/21 128/85  ?04/19/21 124/78  ? ?Wt Readings from Last 3 Encounters:  ?07/01/21 274 lb 9.6 oz (124.6 kg)  ?05/19/21 271 lb (122.9 kg)  ?04/19/21 267 lb (121.1 kg)  ? ? ?Physical Exam ?Constitutional:   ?   General: She is not in acute distress. ?   Appearance: She is not diaphoretic.  ?Cardiovascular:  ?   Rate and Rhythm: Normal rate and regular rhythm.  ?   Heart sounds: Normal heart sounds.  ?Pulmonary:  ?   Effort: Pulmonary effort is normal.  ?   Breath sounds: Normal breath sounds.  ?Musculoskeletal:  ?   Right lower leg: No edema.  ?   Left lower leg: No edema.  ?Skin: ?   General: Skin is warm and dry.  ?Neurological:  ?   Mental Status: She is alert.   ? ? ? ?Assessment/Plan: Please see individual problem list. ? ?Problem List Items Addressed This Visit   ? ? Diabetes mellitus type 2 in obese [facility])  ?  Check A1c.  She will continue metformin 1000 mg twice daily and glipizide 5 mg twice daily.  She will check with her insurance regarding the coverage of Wegovy. ? ?  ?  ? Relevant Orders  ? HgB A1c  ? Hypertension associated with diabetes ([place]) - Primary  ?  Adequately controlled.  She will continue HCTZ 12.5 mg daily and spironolactone 100 mg daily.  Check labs. ? ?  ?  ? Relevant Orders  ? Basic Metabolic Panel (BMET)  ? Severe obesity (BMI >= 40) ([facility])  ?  I encouraged continued diet and exercise.  Discussed checking with her insurance regarding Wegovy coverage.  If needed we could try to get her approved for Ozempic for her diabetes indication. ? ?  ?  ? ?Other Visit Diagnoses   ? ? Colon cancer screening      ? Relevant Orders  ? Ambulatory referral to Gastroenterology  ? ?  ? ? ?Return in about 3 months (around 09/30/2021) for Diabetes. ? ?This visit occurred during the SARS-CoV-2 public health emergency.  Safety protocols were in place, including screening questions prior to the visit, additional usage of staff PPE, and extensive cleaning of exam room while observing appropriate contact time as indicated for disinfecting solutions.  ? ? ?Tommi Rumps, MD ?[place] ? ?

## 2021-07-01 NOTE — Assessment & Plan Note (Signed)
I encouraged continued diet and exercise.  Discussed checking with her insurance regarding Wegovy coverage.  If needed we could try to get her approved for Ozempic for her diabetes indication. ?

## 2021-07-01 NOTE — Patient Instructions (Signed)
Nice to see you. ?Please check with your insurance to see why they are not approving Wegovy. ?GI should contact you to schedule an appointment for your colonoscopy. ?

## 2021-07-01 NOTE — Assessment & Plan Note (Signed)
Adequately controlled.  She will continue HCTZ 12.5 mg daily and spironolactone 100 mg daily.  Check labs. ?

## 2021-07-01 NOTE — Assessment & Plan Note (Signed)
Check A1c.  She will continue metformin 1000 mg twice daily and glipizide 5 mg twice daily.  She will check with her insurance regarding the coverage of Wegovy. ?

## 2021-07-05 ENCOUNTER — Other Ambulatory Visit: Payer: Self-pay | Admitting: Family Medicine

## 2021-07-05 ENCOUNTER — Encounter: Payer: Self-pay | Admitting: Family Medicine

## 2021-07-05 ENCOUNTER — Other Ambulatory Visit: Payer: Self-pay

## 2021-07-05 ENCOUNTER — Telehealth: Payer: Self-pay

## 2021-07-05 DIAGNOSIS — E1169 Type 2 diabetes mellitus with other specified complication: Secondary | ICD-10-CM

## 2021-07-05 DIAGNOSIS — Z1211 Encounter for screening for malignant neoplasm of colon: Secondary | ICD-10-CM

## 2021-07-05 MED ORDER — NA SULFATE-K SULFATE-MG SULF 17.5-3.13-1.6 GM/177ML PO SOLN: 1.0000 | Freq: Once | ORAL | 0 refills | Status: AC

## 2021-07-05 NOTE — Telephone Encounter (Signed)
Gastroenterology Pre-Procedure Review ? ?Request Date: 07/22/21 ?Requesting Physician: Dr. Marius Ditch ? ?PATIENT REVIEW QUESTIONS: The patient responded to the following health history questions as indicated:   ? ?1. Are you having any GI issues? no ?2. Do you have a personal history of Polyps? no ?3. Do you have a family history of Colon Cancer or Polyps? yes (both parents colon polyps) ?4. Diabetes Mellitus? yes (type 2) ?5. Joint replacements in the past 12 months?no ?6. Major health problems in the past 3 months?no ?7. Any artificial heart valves, MVP, or defibrillator?no ?   ?MEDICATIONS & ALLERGIES:    ?Patient reports the following regarding taking any anticoagulation/antiplatelet therapy:   ?Plavix, Coumadin, Eliquis, Xarelto, Lovenox, Pradaxa, Brilinta, or Effient? no ?Aspirin? no ? ?Patient confirms/reports the following medications:  ?Current Outpatient Medications  ?Medication Sig Dispense Refill  ? clobetasol ointment (TEMOVATE) 0.05 % Apply to stubborn areas of Eczema  For 2 weeks, then only on weekends. Avoid Face, groin, and under arms 45 g 3  ? Continuous Blood Gluc Sensor (FREESTYLE LIBRE 3 SENSOR) MISC Apply 1 each topically every 14 (fourteen) days. Place 1 sensor on the skin every 14 days. Use to check glucose continuously 2 each 11  ? Dapsone (ACZONE) 7.5 % GEL Apply small amount once daily to affected areas 60 g 2  ? fluticasone (CUTIVATE) 0.05 % cream APPLY TO AFFECTED AREA UP TO TWICE A DAY AS NEEDED FOR RASH/ITCHING 60 g 0  ? glipiZIDE (GLUCOTROL) 5 MG tablet Take 1 tablet (5 mg total) by mouth 2 (two) times daily before a meal. 60 tablet 3  ? glucose monitoring kit (FREESTYLE) monitoring kit Use as directed to check sugars bid 1 each 0  ? hydrochlorothiazide (MICROZIDE) 12.5 MG capsule TAKE 1 CAPSULE BY MOUTH EVERY DAY 90 capsule 1  ? hydrocortisone 2.5 % cream Apply topically 2 (two) times daily as needed (Rash). To face up to 2 weeks 30 g 1  ? lisdexamfetamine (VYVANSE) 10 MG capsule Take 1  capsule (10 mg total) by mouth daily. 30 capsule 0  ? metFORMIN (GLUCOPHAGE-XR) 500 MG 24 hr tablet TAKE 2 TABLETS BY MOUTH TWICE A DAY 360 tablet 1  ? norethindrone (CAMILA) 0.35 MG tablet Take 1 tablet (0.35 mg total) by mouth daily. 28 tablet 6  ? rosuvastatin (CRESTOR) 20 MG tablet TAKE 1 TABLET BY MOUTH EVERY DAY 90 tablet 3  ? Semaglutide-Weight Management (WEGOVY) 2.4 MG/0.75ML SOAJ Inject 2.4 mg into the skin.    ? spironolactone (ALDACTONE) 100 MG tablet TAKE 1 TABLET BY MOUTH EVERY DAY 90 tablet 0  ? ?No current facility-administered medications for this visit.  ? ? ?Patient confirms/reports the following allergies:  ?No Known Allergies ? ?No orders of the defined types were placed in this encounter. ? ? ?AUTHORIZATION INFORMATION ?Primary Insurance: ?1D#: ?Group #: ? ?Secondary Insurance: ?1D#: ?Group #: ? ?SCHEDULE INFORMATION: ?Date: 07/22/21 ?Time: ?Location: [facility] ?

## 2021-07-14 DIAGNOSIS — E559 Vitamin D deficiency, unspecified: Secondary | ICD-10-CM | POA: Diagnosis not present

## 2021-07-14 DIAGNOSIS — E1159 Type 2 diabetes mellitus with other circulatory complications: Secondary | ICD-10-CM | POA: Diagnosis not present

## 2021-07-14 DIAGNOSIS — E1169 Type 2 diabetes mellitus with other specified complication: Secondary | ICD-10-CM | POA: Diagnosis not present

## 2021-07-20 ENCOUNTER — Encounter (INDEPENDENT_AMBULATORY_CARE_PROVIDER_SITE_OTHER): Payer: Self-pay | Admitting: Family Medicine

## 2021-07-20 ENCOUNTER — Ambulatory Visit (INDEPENDENT_AMBULATORY_CARE_PROVIDER_SITE_OTHER): Payer: BC Managed Care – PPO | Admitting: Family Medicine

## 2021-07-20 VITALS — BP 130/96 | HR 88 | Temp 98.1°F | Ht 65.0 in | Wt 269.0 lb

## 2021-07-20 DIAGNOSIS — E1159 Type 2 diabetes mellitus with other circulatory complications: Secondary | ICD-10-CM | POA: Diagnosis not present

## 2021-07-20 DIAGNOSIS — Z7984 Long term (current) use of oral hypoglycemic drugs: Secondary | ICD-10-CM

## 2021-07-20 DIAGNOSIS — E1169 Type 2 diabetes mellitus with other specified complication: Secondary | ICD-10-CM | POA: Diagnosis not present

## 2021-07-20 DIAGNOSIS — I152 Hypertension secondary to endocrine disorders: Secondary | ICD-10-CM | POA: Diagnosis not present

## 2021-07-20 DIAGNOSIS — E669 Obesity, unspecified: Secondary | ICD-10-CM

## 2021-07-20 DIAGNOSIS — Z6841 Body Mass Index (BMI) 40.0 and over, adult: Secondary | ICD-10-CM

## 2021-07-22 ENCOUNTER — Encounter: Payer: Self-pay | Admitting: Gastroenterology

## 2021-07-22 ENCOUNTER — Other Ambulatory Visit: Payer: Self-pay

## 2021-07-22 ENCOUNTER — Encounter
Admission: RE | Disposition: A | Discharge status: Home / Self Care | Payer: Self-pay | Source: Home / Self Care | Attending: Gastroenterology

## 2021-07-22 ENCOUNTER — Ambulatory Visit: Payer: BC Managed Care – PPO | Admitting: Anesthesiology

## 2021-07-22 ENCOUNTER — Ambulatory Visit
Admission: RE | Admit: 2021-07-22 | Discharge: 2021-07-22 | Disposition: A | Discharge status: Home / Self Care | Payer: BC Managed Care – PPO | Attending: Gastroenterology | Admitting: Gastroenterology

## 2021-07-22 DIAGNOSIS — D122 Benign neoplasm of ascending colon: Secondary | ICD-10-CM

## 2021-07-22 DIAGNOSIS — I10 Essential (primary) hypertension: Secondary | ICD-10-CM | POA: Insufficient documentation

## 2021-07-22 DIAGNOSIS — E669 Obesity, unspecified: Secondary | ICD-10-CM | POA: Insufficient documentation

## 2021-07-22 DIAGNOSIS — Z6841 Body Mass Index (BMI) 40.0 and over, adult: Secondary | ICD-10-CM | POA: Insufficient documentation

## 2021-07-22 DIAGNOSIS — E119 Type 2 diabetes mellitus without complications: Secondary | ICD-10-CM | POA: Diagnosis not present

## 2021-07-22 DIAGNOSIS — F419 Anxiety disorder, unspecified: Secondary | ICD-10-CM | POA: Diagnosis not present

## 2021-07-22 DIAGNOSIS — Z1211 Encounter for screening for malignant neoplasm of colon: Secondary | ICD-10-CM | POA: Diagnosis not present

## 2021-07-22 DIAGNOSIS — K635 Polyp of colon: Secondary | ICD-10-CM | POA: Insufficient documentation

## 2021-07-22 DIAGNOSIS — E1169 Type 2 diabetes mellitus with other specified complication: Secondary | ICD-10-CM

## 2021-07-22 HISTORY — PX: COLONOSCOPY WITH PROPOFOL: SHX5780

## 2021-07-22 LAB — GLUCOSE, CAPILLARY: Glucose-Capillary: 260 mg/dL — ABNORMAL HIGH (ref 70–99)

## 2021-07-22 LAB — POCT PREGNANCY, URINE: Preg Test, Ur: NEGATIVE

## 2021-07-22 SURGERY — COLONOSCOPY WITH PROPOFOL
Anesthesia: General

## 2021-07-22 MED ORDER — LIDOCAINE HCL (CARDIAC) PF 100 MG/5ML IV SOSY
PREFILLED_SYRINGE | INTRAVENOUS | Status: DC | PRN
  Administered 2021-07-22: 80 mg via INTRAVENOUS
  Administered 2021-07-22: 20 mg via INTRAVENOUS

## 2021-07-22 MED ORDER — INSULIN REGULAR HUMAN 100 UNIT/ML IJ SOLN
5.0000 [IU] | INTRAMUSCULAR | Status: DC
  Administered 2021-07-22: 5 [IU] via SUBCUTANEOUS

## 2021-07-22 MED ORDER — INSULIN ASPART 100 UNIT/ML IJ SOLN
INTRAMUSCULAR | Status: AC
  Administered 2021-07-22: 5 [IU] via SUBCUTANEOUS
  Filled 2021-07-22: qty 1

## 2021-07-22 MED ORDER — GLYCOPYRROLATE 0.2 MG/ML IJ SOLN
INTRAMUSCULAR | Status: DC | PRN
  Administered 2021-07-22: .2 mg via INTRAVENOUS

## 2021-07-22 MED ORDER — PROPOFOL 500 MG/50ML IV EMUL
INTRAVENOUS | Status: DC | PRN
  Administered 2021-07-22: 165 ug/kg/min via INTRAVENOUS

## 2021-07-22 MED ORDER — SODIUM CHLORIDE 0.9 % IV SOLN: INTRAVENOUS | Status: DC

## 2021-07-22 MED ORDER — EPHEDRINE SULFATE (PRESSORS) 50 MG/ML IJ SOLN
INTRAMUSCULAR | Status: DC | PRN
  Administered 2021-07-22: 10 mg via INTRAVENOUS

## 2021-07-22 MED ORDER — MIDAZOLAM HCL 2 MG/2ML IJ SOLN
INTRAMUSCULAR | Status: AC
  Filled 2021-07-22: qty 2

## 2021-07-22 MED ORDER — INSULIN ASPART 100 UNIT/ML IJ SOLN: 5.0000 [IU] | INTRAMUSCULAR | Status: AC

## 2021-07-22 MED ORDER — MIDAZOLAM HCL 2 MG/2ML IJ SOLN
INTRAMUSCULAR | Status: DC | PRN
  Administered 2021-07-22: 2 mg via INTRAVENOUS

## 2021-07-22 MED ORDER — PROPOFOL 10 MG/ML IV BOLUS
INTRAVENOUS | Status: DC | PRN
  Administered 2021-07-22 (×2): 30 mg via INTRAVENOUS

## 2021-07-22 NOTE — H&P (Signed)
?Cephas Darby, MD ?[address]  ?[address]  ?[place], [place] [address]  ?Main: [phone]  ?Fax: [phone] ?Pager: [phone] ? ?Primary Care Physician:  Leone Haven, MD ?Primary Gastroenterologist:  Dr. Cephas Darby ? ?Pre-Procedure History & Physical: ?HPI:  Veronica Norton is a 46 y.o. female is here for an colonoscopy. ?  ?Past Medical History:  ?Diagnosis Date  ? Diabetes mellitus   ? H/O pilonidal cyst   ? Hypertension   ? Normal cardiac stress test 2009  ? Pre-diabetes   ? ? ?Past Surgical History:  ?Procedure Laterality Date  ? PILONIDAL CYST EXCISION    ? WISDOM TOOTH EXTRACTION    ? ? ?Prior to Admission medications   ?Medication Sig Start Date End Date Taking? Authorizing Provider  ?clobetasol ointment (TEMOVATE) 0.05 % Apply to stubborn areas of Eczema  For 2 weeks, then only on weekends. Avoid Face, groin, and under arms 11/19/19   [place], [place], MD  ?Continuous Blood Gluc Sensor (FREESTYLE LIBRE 3 SENSOR) MISC Apply 1 each topically every 14 (fourteen) days. Place 1 sensor on the skin every 14 days. Use to check glucose continuously 03/24/21   Leone Haven, MD  ?Dapsone (ACZONE) 7.5 % GEL Apply small amount once daily to affected areas 11/19/19   [facility], [place], MD  ?fluticasone (CUTIVATE) 0.05 % cream APPLY TO AFFECTED AREA UP TO TWICE A DAY AS NEEDED FOR RASH/ITCHING 03/28/19   Leone Haven, MD  ?glipiZIDE (GLUCOTROL) 5 MG tablet Take 1 tablet (5 mg total) by mouth 2 (two) times daily before a meal. 03/30/21   Briscoe Deutscher, DO  ?glucose monitoring kit (FREESTYLE) monitoring kit Use as directed to check sugars bid 10/31/13   Jackolyn Confer, MD  ?hydrochlorothiazide (MICROZIDE) 12.5 MG capsule TAKE 1 CAPSULE BY MOUTH EVERY DAY 04/18/21   Leone Haven, MD  ?hydrocortisone 2.5 % cream Apply topically 2 (two) times daily as needed (Rash). To face up to 2 weeks 04/08/20   Laurence Ferrari, [place], MD  ?lisdexamfetamine (VYVANSE) 10 MG capsule Take 1 capsule (10 mg total)  by mouth daily. 03/31/21   Briscoe Deutscher, DO  ?metFORMIN (GLUCOPHAGE-XR) 500 MG 24 hr tablet TAKE 2 TABLETS BY MOUTH TWICE A DAY 06/24/20   Leone Haven, MD  ?norethindrone (CAMILA) 0.35 MG tablet Take 1 tablet (0.35 mg total) by mouth daily. 04/10/16   Leone Haven, MD  ?rosuvastatin (CRESTOR) 20 MG tablet TAKE 1 TABLET BY MOUTH EVERY DAY 01/12/21   Leone Haven, MD  ?Semaglutide-Weight Management (WEGOVY) 2.4 MG/0.75ML SOAJ Inject 2.4 mg into the skin. ?Patient not taking: Reported on 07/20/2021    [provider]  ? ? ?Allergies as of 07/05/2021  ? (No Known Allergies)  ? ? ?Family History  ?Problem Relation Age of Onset  ? Heart disease Mother   ?     2005 stent placement  ? Hypertension Mother   ? Diabetes Father   ? Hypertension Father   ? Obesity Father   ? Stroke Maternal Aunt   ? Cancer Maternal Grandfather   ?     prostate  ? Heart disease Maternal Grandfather   ? Heart disease Maternal Uncle   ? Heart disease Maternal Grandmother   ? ? ?Social History  ? ?Socioeconomic History  ? Marital status: Single  ?  Spouse name: Not on file  ? Number of children: 0  ? Years of education: Not on file  ? Highest education level: Not on file  ?  Occupational History  ? Occupation: Substance abuse educator - [employer]   ?Tobacco Use  ? Smoking status: Never  ? Smokeless tobacco: Never  ?Vaping Use  ? Vaping Use: Never used  ?Substance and Sexual Activity  ? Alcohol use: Yes  ?  Comment: Occasional,non last 24hrs  ? Drug use: No  ? Sexual activity: Not on file  ?Other Topics Concern  ? Not on file  ?Social History Narrative  ? Lives in [place]. Works counseling substance abusers.  ?   ? Regular Exercise -  NO  ? Daily Caffeine Use:  NO  ?   ?   ?   ? ?Social Determinants of Health  ? ?Financial Resource Strain: Low Risk   ? Difficulty of Paying Living Expenses: Not hard at all  ?Food Insecurity: Not on file  ?Transportation Needs: Not on file  ?Physical Activity: Not on file  ?Stress: Not on file   ?Social Connections: Not on file  ?Intimate Partner Violence: Not on file  ? ? ?Review of Systems: ?See HPI, otherwise negative ROS ? ?Physical Exam: ?BP (!) 108/55   Pulse 84   Temp (!) 96 ?F (35.6 ?C) (Temporal)   Resp 20   Ht _0  (1.651 m)   Wt 269 lb (122 kg)   SpO2 100%   BMI 44.76 kg/m?  ?General:   Alert,  pleasant and cooperative in NAD ?Head:  Normocephalic and atraumatic. ?Neck:  Supple; no masses or thyromegaly. ?Lungs:  Clear throughout to auscultation.    ?Heart:  Regular rate and rhythm. ?Abdomen:  Soft, nontender and nondistended. Normal bowel sounds, without guarding, and without rebound.   ?Neurologic:  Alert and  oriented x4;  grossly normal neurologically. ? ?Impression/Plan: ?Veronica Norton is here for an colonoscopy to be performed for colon cancer screening ? ?Risks, benefits, limitations, and alternatives regarding  colonoscopy have been reviewed with the patient.  Questions have been answered.  All parties agreeable. ? ? ?Sherri Sear, MD  07/22/2021, 11:21 AM ?

## 2021-07-22 NOTE — Op Note (Signed)
[facility] ?Gastroenterology ?Patient Name: Veronica Norton ?Procedure Date: 07/22/2021 10:25 AM ?MRN: [id number] ?Account #: [id number] ?Date of Birth: Jan 06, 1976 ?Admit Type: Outpatient ?Age: 46 ?Room: [facility] ENDO ROOM 2 ?Gender: Female ?Note Status: Finalized ?Instrument Name: Colonoscope [id number] ?Procedure:             Colonoscopy ?Indications:           Screening for colorectal malignant neoplasm, This is  ?                       the patient's first colonoscopy ?Providers:             Lin Landsman MD, MD ?Referring MD:          Angela Adam. Caryl Bis (Referring MD) ?Medicines:             General Anesthesia ?Complications:         No immediate complications. Estimated blood loss: None. ?Procedure:             Pre-Anesthesia Assessment: ?                       - Prior to the procedure, a History and Physical was  ?                       performed, and patient medications and allergies were  ?                       reviewed. The patient is competent. The risks and  ?                       benefits of the procedure and the sedation options and  ?                       risks were discussed with the patient. All questions  ?                       were answered and informed consent was obtained.  ?                       Patient identification and proposed procedure were  ?                       verified by the physician, the nurse, the  ?                       anesthesiologist, the anesthetist and the technician  ?                       in the pre-procedure area in the procedure room in the  ?                       endoscopy suite. Mental Status Examination: alert and  ?                       oriented. Airway Examination: normal oropharyngeal  ?                       airway and neck mobility. Respiratory Examination:  ?  clear to auscultation. CV Examination: normal.  ?                       Prophylactic Antibiotics: The patient does not require  ?                       prophylactic  antibiotics. Prior Anticoagulants: The  ?                       patient has taken no previous anticoagulant or  ?                       antiplatelet agents. ASA Grade Assessment: III - A  ?                       patient with severe systemic disease. After reviewing  ?                       the risks and benefits, the patient was deemed in  ?                       satisfactory condition to undergo the procedure. The  ?                       anesthesia plan was to use general anesthesia.  ?                       Immediately prior to administration of medications,  ?                       the patient was re-assessed for adequacy to receive  ?                       sedatives. The heart rate, respiratory rate, oxygen  ?                       saturations, blood pressure, adequacy of pulmonary  ?                       ventilation, and response to care were monitored  ?                       throughout the procedure. The physical status of the  ?                       patient was re-assessed after the procedure. ?                       After obtaining informed consent, the colonoscope was  ?                       passed under direct vision. Throughout the procedure,  ?                       the patient's blood pressure, pulse, and oxygen  ?                       saturations were monitored continuously. The  ?  Colonoscope was introduced through the anus and  ?                       advanced to the the cecum, identified by appendiceal  ?                       orifice and ileocecal valve. The colonoscopy was  ?                       performed without difficulty. The patient tolerated  ?                       the procedure well. The quality of the bowel  ?                       preparation was evaluated using the BBPS [facility] Bowel  ?                       Preparation Scale) with scores of: Right Colon = 3,  ?                       Transverse Colon = 3 and Left Colon = 3 (entire mucosa  ?                        seen well with no residual staining, small fragments  ?                       of stool or opaque liquid). The total BBPS score  ?                       equals 9. ?Findings: ?     The perianal and digital rectal examinations were normal. Pertinent  ?     negatives include normal sphincter tone and no palpable rectal lesions. ?     A 5 mm polyp was found in the ascending colon. The polyp was sessile.  ?     The polyp was removed with a cold snare. Polyp resection was incomplete,  ?     and the resected tissue was not retrieved. ?     A 4 mm polyp was found in the descending colon. The polyp was sessile.  ?     The polyp was removed with a cold snare. Resection and retrieval were  ?     complete. ?     The retroflexed view of the distal rectum and anal verge was normal and  ?     showed no anal or rectal abnormalities. ?Impression:            - One 5 mm polyp in the ascending colon, removed with  ?                       a cold snare. Incomplete resection. Resected tissue  ?                       not retrieved. ?                       - One 4 mm polyp in the descending colon, removed with  ?  a cold snare. Resected and retrieved. ?                       - The distal rectum and anal verge are normal on  ?                       retroflexion view. ?Recommendation:        - Discharge patient to home (with escort). ?                       - Resume previous diet today. ?                       - Continue present medications. ?                       - Await pathology results. ?                       - Repeat colonoscopy in 5 years for surveillance. ?Procedure Code(s):     --- Professional --- ?                       [phone], Colonoscopy, flexible; with removal of  ?                       tumor(s), polyp(s), or other lesion(s) by snare  ?                       technique ?Diagnosis Code(s):     --- Professional --- ?                       Z12.11, Encounter for screening for malignant neoplasm  ?                        of colon ?                       K63.5, Polyp of colon ?CPT copyright 2019 American Medical Association. All rights reserved. ?The codes documented in this report are preliminary and upon coder review may  ?be revised to meet current compliance requirements. ?Dr. Ulyess Mort ?Kiri Hinderliter Raeanne Gathers MD, MD ?07/22/2021 10:47:58 AM ?This report has been signed electronically. ?Number of Addenda: 0 ?Note Initiated On: 07/22/2021 10:25 AM ?Scope Withdrawal Time: 0 hours 11 minutes 6 seconds  ?Total Procedure Duration: 0 hours 15 minutes 21 seconds  ?Estimated Blood Loss:  Estimated blood loss: none. ?     [facility] ?

## 2021-07-22 NOTE — Transfer of Care (Signed)
Immediate Anesthesia Transfer of Care Note ? ?Patient: Veronica Norton ? ?Procedure(s) Performed: COLONOSCOPY WITH PROPOFOL ? ?Patient Location: Endoscopy Unit ? ?Anesthesia Type:General ? ?Level of Consciousness: awake, drowsy and patient cooperative ? ?Airway & Oxygen Therapy: Patient Spontanous Breathing and Patient connected to face mask oxygen ? ?Post-op Assessment: Report given to RN and Post -op Vital signs reviewed and stable ? ?Post vital signs: Reviewed and stable ? ?Last Vitals:  ?Vitals Value Taken Time  ?BP 103/86 07/22/21 1051  ?Temp    ?Pulse 106 07/22/21 1053  ?Resp 20 07/22/21 1053  ?SpO2 100 % 07/22/21 1053  ?Vitals shown include unvalidated device data. ? ?Last Pain:  ?Vitals:  ? 07/22/21 1000  ?TempSrc: Temporal  ?PainSc: 0-No pain  ?   ? ?  ? ?Complications: No notable events documented. ?

## 2021-07-22 NOTE — Anesthesia Preprocedure Evaluation (Addendum)
Anesthesia Evaluation  ?Patient identified by MRN, date of birth, ID band ?Patient awake ? ? ? ?Reviewed: ?Allergy & Precautions, NPO status , Patient's Chart, lab work & pertinent test results ? ?History of Anesthesia Complications ?Negative for: history of anesthetic complications ? ?Airway ?Mallampati: IV ? ? ?Neck ROM: Full ? ? ? Dental ?no notable dental hx. ? ?  ?Pulmonary ?neg pulmonary ROS,  ?  ?Pulmonary exam normal ?breath sounds clear to auscultation ? ? ? ? ? ? Cardiovascular ?hypertension, Normal cardiovascular exam ?Rhythm:Regular Rate:Normal ? ?ECG 08/12/20:  ?Normal sinus rhythm ?Left ventricular hypertrophy with repolarization abnormality ( R in aVL ) ?  ?Neuro/Psych ?negative neurological ROS ?   ? GI/Hepatic ?negative GI ROS,   ?Endo/Other  ?diabetes, Poorly Controlled, Type 2Class 3 obesity ? Renal/GU ?negative Renal ROS  ? ?  ?Musculoskeletal ? ? Abdominal ?  ?Peds ? Hematology ?negative hematology ROS ?(+)   ?Anesthesia Other Findings ? ? Reproductive/Obstetrics ? ?  ? ? ? ? ? ? ? ? ? ? ? ? ? ?  ?  ? ? ? ? ? ? ? ?Anesthesia Physical ?Anesthesia Plan ? ?ASA: 3 ? ?Anesthesia Plan: General  ? ?Post-op Pain Management:   ? ?Induction: Intravenous ? ?PONV Risk Score and Plan: 3 and Propofol infusion, TIVA and Treatment may vary due to age or medical condition ? ?Airway Management Planned: Natural Airway ? ?Additional Equipment:  ? ?Intra-op Plan:  ? ?Post-operative Plan:  ? ?Informed Consent: I have reviewed the patients History and Physical, chart, labs and discussed the procedure including the risks, benefits and alternatives for the proposed anesthesia with the patient or authorized representative who has indicated his/her understanding and acceptance.  ? ? ? ? ? ?Plan Discussed with: CRNA ? ?Anesthesia Plan Comments: (LMA/GETA backup discussed.  Patient consented for risks of anesthesia including but not limited to:  ?- adverse reactions to medications ?- damage  to eyes, teeth, lips or other oral mucosa ?- nerve damage due to positioning  ?- sore throat or hoarseness ?- damage to heart, brain, nerves, lungs, other parts of body or loss of life ? ?Informed patient about role of CRNA in peri- and intra-operative care.  Patient voiced understanding.)  ? ? ? ? ? ? ?Anesthesia Quick Evaluation ? ?

## 2021-07-22 NOTE — Anesthesia Postprocedure Evaluation (Signed)
Anesthesia Post Note ? ?Patient: Veronica Norton ? ?Procedure(s) Performed: COLONOSCOPY WITH PROPOFOL ? ?Patient location during evaluation: PACU ?Anesthesia Type: General ?Level of consciousness: awake and alert, oriented and patient cooperative ?Pain management: pain level controlled ?Vital Signs Assessment: post-procedure vital signs reviewed and stable ?Respiratory status: spontaneous breathing, nonlabored ventilation and respiratory function stable ?Cardiovascular status: blood pressure returned to baseline and stable ?Postop Assessment: adequate PO intake ?Anesthetic complications: no ? ? ?No notable events documented. ? ? ?Last Vitals:  ?Vitals:  ? 07/22/21 1105 07/22/21 1109  ?BP:  120/72  ?Pulse:    ?Resp:    ?Temp: (!) 35.6 ?C   ?SpO2:    ?  ?Last Pain:  ?Vitals:  ? 07/22/21 1121  ?TempSrc:   ?PainSc: 0-No pain  ? ? ?  ?  ?  ?  ?  ?  ? ?Darrin Nipper ? ? ? ? ?

## 2021-07-22 NOTE — Anesthesia Procedure Notes (Addendum)
Procedure Name: General with mask airway ?Date/Time: 07/22/2021 10:28 AM ?Performed by: Kelton Pillar, CRNA ?Pre-anesthesia Checklist: Patient identified, Emergency Drugs available, Suction available and Patient being monitored ?Patient Re-evaluated:Patient Re-evaluated prior to induction ?Oxygen Delivery Method: Simple face mask ?Induction Type: IV induction ?Ventilation: Oral airway inserted - appropriate to patient size ?Placement Confirmation: positive ETCO2, CO2 detector and breath sounds checked- equal and bilateral ?Dental Injury: Teeth and Oropharynx as per pre-operative assessment  ? ? ? ? ?

## 2021-07-25 LAB — SURGICAL PATHOLOGY

## 2021-07-26 ENCOUNTER — Encounter: Payer: Self-pay | Admitting: Gastroenterology

## 2021-07-29 NOTE — Progress Notes (Signed)
Chief Complaint:   OBESITY Veronica Norton is here to discuss her progress with her obesity treatment plan along with follow-up of her obesity related diagnoses. Veronica Norton is on the Category 1 Plan and states she is following her eating plan approximately 40% of the time. Veronica Norton states she is walking 30 minutes 2 to 3 times per week.  Today's visit was #: 37 Starting weight: 284 lbs Starting date: 02/03/2019 Today's weight: 269 lbs Today's date: 07/20/2021 Total lbs lost to date: 15 Total lbs lost since last in-office visit: 2  Interim History: Veronica Norton's last office visit with [place] was on 05/19/21. She is not eating enough per patient. Veronica Norton snacks, but is not eating healthy or following the mean plan at all per patient. She has an upcoming colonoscopy in 2 days.  Subjective:   1. Type 2 diabetes mellitus with other specified complication, without long-term current use of insulin (HCC) Lesly's A1c 2 weeks ago was 10.0. She is upset that it's so high. Veronica Norton is working with Dr. Greer Pickerel at [facility] surgery and prepping for bariatric surgery. She was last seen 07/14/21 and notes were reviewed.  2. Hypertension associated with diabetes ([place]) Jaylinn was recently seen her PCP on 06/12/21. Notes were reviewed. She is on HCTZ 12.'5mg'$  and spironalactone '100mg'$  QD. Ivone has not been checking her blood pressure at home. She is asymptomatic.  Assessment/Plan:  No orders of the defined types were placed in this encounter.   Medications Discontinued During This Encounter  Medication Reason   spironolactone (ALDACTONE) 100 MG tablet Patient Preference     No orders of the defined types were placed in this encounter.    1. Type 2 diabetes mellitus with other specified complication, without long-term current use of insulin (HCC) Veronica Norton agrees to continue her Glucotrol, metformin, and Wegovy per her PCP. We had a long discussion regarding her poorly controlled blood sugars and I highly recommend that she  follow her PNP in addition to taking her current medications.  2. Hypertension associated with diabetes ([place]) Veronica Norton's diastolic blood pressure is above goal. Veronica Norton agrees to check her blood pressure at home and to keep a log. If her blood pressure remains above goal despite weight loss and her PNP, she will need a medication adjustment with her PCP.  3. Obesity, Current BMI 44.8 Veronica Norton agrees to bring in her food journal log to the next office visit. We discussed Veronica Norton getting back on her plan after upcoming colonoscopy.  Veronica Norton is currently in the action stage of change. As such, her goal is to continue with weight loss efforts. She has agreed to keeping a food journal and adhering to recommended goals of 900 to 1000 calories and 75+ grams of protein.   Exercise goals:  As is.  Behavioral modification strategies: increasing lean protein intake, decreasing simple carbohydrates, planning for success, and keeping a strict food journal.  Veronica Norton has agreed to follow-up with our clinic in 4 weeks. She was informed of the importance of frequent follow-up visits to maximize her success with intensive lifestyle modifications for her multiple health conditions.   Objective:   Blood pressure (!) 130/96, pulse 88, temperature 98.1 F (36.7 C), height '5\' 5"'$  (1.651 m), weight 269 lb (122 kg), SpO2 98 %. Body mass index is 44.76 kg/m.  General: Cooperative, alert, well developed, in no acute distress. HEENT: Conjunctivae and lids unremarkable. Cardiovascular: Regular rhythm.  Lungs: Normal work of breathing. Neurologic: No focal deficits.   Lab Results  Component  Value Date   CREATININE 0.68 07/01/2021   BUN 12 07/01/2021   NA 131 (L) 07/01/2021   K 3.9 07/01/2021   CL 98 07/01/2021   CO2 26 07/01/2021   Lab Results  Component Value Date   ALT 19 08/06/2020   AST 10 08/06/2020   ALKPHOS 85 08/06/2020   BILITOT 0.4 08/06/2020   Lab Results  Component Value Date   HGBA1C 10.0 (H)  07/01/2021   HGBA1C 10.7 (H) 03/02/2021   HGBA1C 8.9 (A) 11/24/2020   HGBA1C 9.6 (H) 08/06/2020   HGBA1C 7.6 (H) 02/27/2020   No results found for: INSULIN Lab Results  Component Value Date   TSH 1.08 03/02/2021   Lab Results  Component Value Date   CHOL 82 08/06/2020   HDL 42.30 08/06/2020   LDLCALC 25 08/06/2020   LDLDIRECT 23.0 01/21/2020   TRIG 75.0 08/06/2020   CHOLHDL 2 08/06/2020   Lab Results  Component Value Date   VD25OH 22.62 (L) 05/07/2020   VD25OH 22.97 (L) 02/27/2020   VD25OH 40.6 05/05/2019   Lab Results  Component Value Date   WBC 7.3 03/02/2021   HGB 14.5 03/02/2021   HCT 42.8 03/02/2021   MCV 96.8 03/02/2021   PLT 358.0 03/02/2021   No results found for: IRON, TIBC, FERRITIN  Attestation Statements:   Reviewed by clinician on day of visit: allergies, medications, problem list, medical history, surgical history, family history, social history, and previous encounter notes.  Time spent on visit including pre-visit chart review and post-visit care and charting was 30 minutes.   IMarcille Blanco, CMA, am acting as transcriptionist for [employer], DO  I have reviewed the above documentation for accuracy and completeness, and I agree with the above. Marjory Sneddon, D.O.  The [place] was signed into law in 2016 which includes the topic of electronic health records.  This provides immediate access to information in MyChart.  This includes consultation notes, operative notes, office notes, lab results and pathology reports.  If you have any questions about what you read please let us know at your next visit so we can discuss your concerns and take corrective action if need be.  We are right here with you.

## 2021-08-18 ENCOUNTER — Encounter (INDEPENDENT_AMBULATORY_CARE_PROVIDER_SITE_OTHER): Payer: Self-pay | Admitting: Family Medicine

## 2021-08-18 ENCOUNTER — Ambulatory Visit (INDEPENDENT_AMBULATORY_CARE_PROVIDER_SITE_OTHER): Payer: BC Managed Care – PPO | Admitting: Family Medicine

## 2021-08-18 VITALS — BP 139/84 | HR 79 | Temp 98.0°F | Ht 65.0 in | Wt 272.0 lb

## 2021-08-18 DIAGNOSIS — E1159 Type 2 diabetes mellitus with other circulatory complications: Secondary | ICD-10-CM | POA: Diagnosis not present

## 2021-08-18 DIAGNOSIS — I152 Hypertension secondary to endocrine disorders: Secondary | ICD-10-CM | POA: Diagnosis not present

## 2021-08-18 DIAGNOSIS — Z9189 Other specified personal risk factors, not elsewhere classified: Secondary | ICD-10-CM

## 2021-08-18 DIAGNOSIS — E1165 Type 2 diabetes mellitus with hyperglycemia: Secondary | ICD-10-CM

## 2021-08-18 DIAGNOSIS — Z6841 Body Mass Index (BMI) 40.0 and over, adult: Secondary | ICD-10-CM

## 2021-08-18 DIAGNOSIS — Z7984 Long term (current) use of oral hypoglycemic drugs: Secondary | ICD-10-CM

## 2021-08-18 DIAGNOSIS — E668 Other obesity: Secondary | ICD-10-CM

## 2021-08-18 DIAGNOSIS — E65 Localized adiposity: Secondary | ICD-10-CM

## 2021-08-23 DIAGNOSIS — E1165 Type 2 diabetes mellitus with hyperglycemia: Secondary | ICD-10-CM | POA: Insufficient documentation

## 2021-08-23 DIAGNOSIS — Z9189 Other specified personal risk factors, not elsewhere classified: Secondary | ICD-10-CM | POA: Insufficient documentation

## 2021-08-23 DIAGNOSIS — E65 Localized adiposity: Secondary | ICD-10-CM | POA: Insufficient documentation

## 2021-08-24 NOTE — Progress Notes (Unsigned)
Chief Complaint:   OBESITY Veronica Norton is here to discuss her progress with her obesity treatment plan along with follow-up of her obesity related diagnoses. Veronica Norton is on the Category 1 Plan and states she is following her eating plan approximately 50% of the time. Veronica Norton states she is walking 45-60 minutes 2 times per week.  Today's visit was #: 49 Starting weight: 284 lbs Starting date: 02/03/2019 Today's weight: 272 lbs Today's date: 08/18/2021 Total lbs lost to date: 12 lbs Total lbs lost since last in-office visit: 0  Interim History: Veronica Norton did not hit protein goals and was over in calories by a lot. Over by [phone] per day.  She only journaled for the past couple of days.  Subjective:   1. Poorly controlled diabetes mellitus (Veronica) Norton's insurance will not cover Ozempic or Mounjaro per patient.  Not on Wegovy, due to no coverage.  She is not checking her blood sugars.  She is on glipizide and Metformin XR, tolerating it well.  2. Visceral obesity Veronica Norton has been working with [facility] Dr Greer Pickerel on meeting criteria for bariatric surgery.  Veronica Norton feels like this is her only option for weight loss.  3. Hypertension associated with type 2 diabetes mellitus (Veronica Norton) History of using lisinopril and HCTZ.  4. At risk for malnutrition Veronica Norton is at increased risk for malnutrition due to her poor nutrition.   Assessment/Plan:   1. Poorly controlled diabetes mellitus (Veronica Norton) Discussed with patient that she would not be a good candidate for Imcivree.  I am not sure why her insurance would only cover that for her poorly controlled diabetes mellitus or obesity. Continue diabetes mellitus management pere PCP.  She has no coverage for a GLP-1.    2. Visceral obesity Reminded Veronica Norton that even with surgery, if she does not learn to eat healthy she will gain her weight back.  Highly encouraged her to follow prudent nutritional plan to decrease visceral fate rating of 17.   3.  Hypertension associated with type 2 diabetes mellitus (Veronica Norton) Veronica Norton is working on healthy weight loss and exercise to improve blood pressure control. We will watch for signs of hypotension as she continues her lifestyle modifications.  Blood pressure is at goal.  Continue medications per PCP and prudent nutritional plan with decreased salt intake.  4. At risk for malnutrition Veronica Norton was given approximately 9 minutes of counseling today regarding prevention of malnutrition and ways to meet macronutrient goals..    5. Obesity, Current BMI 45.4 We will hold off on medications or labs as Veronica Norton has an appointment with her PCP in July.  Her last A1c was 10.0 just <2 months ago.  Veronica Norton is currently in the action stage of change. As such, her goal is to continue with weight loss efforts. She has agreed to the Category 1 Plan or keeping a food journal and adhering to recommended goals of [phone] calories and 100+ protein daily.   Exercise goals: For substantial health benefits, adults should do at least 150 minutes (2 hours and 30 minutes) a week of moderate-intensity, or 75 minutes (1 hour and 15 minutes) a week of vigorous-intensity aerobic physical activity, or an equivalent combination of moderate- and vigorous-intensity aerobic activity. Aerobic activity should be performed in episodes of at least 10 minutes, and preferably, it should be spread throughout the week.  Behavioral modification strategies: increasing lean protein intake, decreasing simple carbohydrates, and no skipping meals.  Veronica Norton has agreed to follow-up with our clinic in  8 weeks. She was informed of the importance of frequent follow-up visits to maximize her success with intensive lifestyle modifications for her multiple health conditions.   Objective:   Blood pressure 139/84, pulse 79, temperature 98 F (36.7 C), height '5\' 5"'$  (1.651 m), weight 272 lb (123.4 kg), SpO2 99 %. Body mass index is 45.26 kg/m.  General: Cooperative,  alert, well developed, in no acute distress. HEENT: Conjunctivae and lids unremarkable. Cardiovascular: Regular rhythm.  Lungs: Normal work of breathing. Neurologic: No focal deficits.   Lab Results  Component Value Date   CREATININE 0.68 07/01/2021   BUN 12 07/01/2021   NA 131 (L) 07/01/2021   K 3.9 07/01/2021   CL 98 07/01/2021   CO2 26 07/01/2021   Lab Results  Component Value Date   ALT 19 08/06/2020   AST 10 08/06/2020   ALKPHOS 85 08/06/2020   BILITOT 0.4 08/06/2020   Lab Results  Component Value Date   HGBA1C 10.0 (H) 07/01/2021   HGBA1C 10.7 (H) 03/02/2021   HGBA1C 8.9 (A) 11/24/2020   HGBA1C 9.6 (H) 08/06/2020   HGBA1C 7.6 (H) 02/27/2020   No results found for: "INSULIN" Lab Results  Component Value Date   TSH 1.08 03/02/2021   Lab Results  Component Value Date   CHOL 82 08/06/2020   HDL 42.30 08/06/2020   LDLCALC 25 08/06/2020   LDLDIRECT 23.0 01/21/2020   TRIG 75.0 08/06/2020   CHOLHDL 2 08/06/2020   Lab Results  Component Value Date   VD25OH 22.62 (L) 05/07/2020   VD25OH 22.97 (L) 02/27/2020   VD25OH 40.6 05/05/2019   Lab Results  Component Value Date   WBC 7.3 03/02/2021   HGB 14.5 03/02/2021   HCT 42.8 03/02/2021   MCV 96.8 03/02/2021   PLT 358.0 03/02/2021   No results found for: "IRON", "TIBC", "FERRITIN"  Attestation Statements:   Reviewed by clinician on day of visit: allergies, medications, problem list, medical history, surgical history, family history, social history, and previous encounter notes.  I, Davy Pique, RMA, am acting as Location manager for [employer], DO.  I have reviewed the above documentation for accuracy and completeness, and I agree with the above. -  ***

## 2021-09-04 ENCOUNTER — Other Ambulatory Visit: Payer: Self-pay | Admitting: Family Medicine

## 2021-09-04 DIAGNOSIS — E119 Type 2 diabetes mellitus without complications: Secondary | ICD-10-CM

## 2021-09-26 ENCOUNTER — Other Ambulatory Visit: Payer: Self-pay | Admitting: Family Medicine

## 2021-09-30 ENCOUNTER — Ambulatory Visit: Payer: BC Managed Care – PPO | Admitting: Family Medicine

## 2021-10-13 ENCOUNTER — Encounter (INDEPENDENT_AMBULATORY_CARE_PROVIDER_SITE_OTHER): Payer: Self-pay | Admitting: Family Medicine

## 2021-10-13 ENCOUNTER — Ambulatory Visit (INDEPENDENT_AMBULATORY_CARE_PROVIDER_SITE_OTHER): Payer: BC Managed Care – PPO | Admitting: Family Medicine

## 2021-10-13 VITALS — BP 135/84 | HR 81 | Temp 98.3°F | Ht 65.0 in | Wt 271.0 lb

## 2021-10-13 DIAGNOSIS — Z7984 Long term (current) use of oral hypoglycemic drugs: Secondary | ICD-10-CM

## 2021-10-13 DIAGNOSIS — Z6841 Body Mass Index (BMI) 40.0 and over, adult: Secondary | ICD-10-CM

## 2021-10-13 DIAGNOSIS — E1169 Type 2 diabetes mellitus with other specified complication: Secondary | ICD-10-CM

## 2021-10-13 DIAGNOSIS — E669 Obesity, unspecified: Secondary | ICD-10-CM | POA: Diagnosis not present

## 2021-10-19 ENCOUNTER — Ambulatory Visit: Payer: BC Managed Care – PPO | Admitting: Family Medicine

## 2021-10-19 ENCOUNTER — Encounter (INDEPENDENT_AMBULATORY_CARE_PROVIDER_SITE_OTHER): Payer: Self-pay

## 2021-10-21 NOTE — Progress Notes (Unsigned)
Chief Complaint:   OBESITY Veronica Norton is here to discuss her progress with her obesity treatment plan along with follow-up of her obesity related diagnoses. [name] is on the Category 1 Plan and states she is following her eating plan approximately 40% of the time. [name] states she is walking 30-45 minutes 2 times per week.  Today's visit was #: 14 Starting weight: 284 lbs Starting date: 02/03/2019 Today's weight: 271 lbs Today's date: 10/13/2021 Total lbs lost to date: 13 Total lbs lost since last in-office visit: 1  Interim History: Pt arrived 30-40 minutes prior to appt time for repeat IC, but it was never written in appt notes.   Subjective:   1. Type 2 diabetes mellitus with other specified complication, without long-term current use of insulin ([place]) Per medication list, pt is not on glipizide or Wegovy, but she is taking Metformin.  Assessment/Plan:  No orders of the defined types were placed in this encounter.   Medications Discontinued During This Encounter  Medication Reason   glipiZIDE (GLUCOTROL) 5 MG tablet    Semaglutide-Weight Management (WEGOVY) 2.4 MG/0.75ML SOAJ      No orders of the defined types were placed in this encounter.    1. Type 2 diabetes mellitus with other specified complication, without long-term current use of insulin (HCC) A1c is very controlled. Good blood sugar control is important to decrease the likelihood of diabetic complications such as nephropathy, neuropathy, limb loss, blindness, coronary artery disease, and death. Intensive lifestyle modification including diet, exercise and weight loss are the first line of treatment for diabetes. Check labs at next OV. Pt needs close follow up with PCP for chronic disease management.  2. Obesity, Current BMI 45.2 Veronica Norton is currently in the action stage of change. As such, her goal is to continue with weight loss efforts. She has agreed to the Category 1 Plan.   For the week, journal everything eaten  off plan and check blood sugars. Then for 2 weeks, follow category 1 and journal and check blood sugars daily.  Arrive to next OV 30 minutes prior to appt time for repeat IC.  Exercise goals:  As is  Behavioral modification strategies: planning for success.  Veronica Norton has agreed to follow-up with our clinic in 3 weeks. She was informed of the importance of frequent follow-up visits to maximize her success with intensive lifestyle modifications for her multiple health conditions.   Objective:   Blood pressure 135/84, pulse 81, temperature 98.3 F (36.8 C), height '5\' 5"'$  (1.651 m), weight 271 lb (122.9 kg), SpO2 98 %. Body mass index is 45.1 kg/m.  General: Cooperative, alert, well developed, in no acute distress. HEENT: Conjunctivae and lids unremarkable. Cardiovascular: Regular rhythm.  Lungs: Normal work of breathing. Neurologic: No focal deficits.   Lab Results  Component Value Date   CREATININE 0.68 07/01/2021   BUN 12 07/01/2021   NA 131 (L) 07/01/2021   K 3.9 07/01/2021   CL 98 07/01/2021   CO2 26 07/01/2021   Lab Results  Component Value Date   ALT 19 08/06/2020   AST 10 08/06/2020   ALKPHOS 85 08/06/2020   BILITOT 0.4 08/06/2020   Lab Results  Component Value Date   HGBA1C 10.0 (H) 07/01/2021   HGBA1C 10.7 (H) 03/02/2021   HGBA1C 8.9 (A) 11/24/2020   HGBA1C 9.6 (H) 08/06/2020   HGBA1C 7.6 (H) 02/27/2020   No results found for: "INSULIN" Lab Results  Component Value Date   TSH 1.08 03/02/2021  Lab Results  Component Value Date   CHOL 82 08/06/2020   HDL 42.30 08/06/2020   LDLCALC 25 08/06/2020   LDLDIRECT 23.0 01/21/2020   TRIG 75.0 08/06/2020   CHOLHDL 2 08/06/2020   Lab Results  Component Value Date   VD25OH 22.62 (L) 05/07/2020   VD25OH 22.97 (L) 02/27/2020   VD25OH 40.6 05/05/2019   Lab Results  Component Value Date   WBC 7.3 03/02/2021   HGB 14.5 03/02/2021   HCT 42.8 03/02/2021   MCV 96.8 03/02/2021   PLT 358.0 03/02/2021     Attestation Statements:   Reviewed by clinician on day of visit: allergies, medications, problem list, medical history, surgical history, family history, social history, and previous encounter notes.  I, Kathlene November, BS, CMA, am acting as transcriptionist for [employer], DO.   I have reviewed the above documentation for accuracy and completeness, and I agree with the above. Marjory Sneddon, D.O.  The [place] was signed into law in 2016 which includes the topic of electronic health records.  This provides immediate access to information in MyChart.  This includes consultation notes, operative notes, office notes, lab results and pathology reports.  If you have any questions about what you read please let us know at your next visit so we can discuss your concerns and take corrective action if need be.  We are right here with you.

## 2021-11-16 ENCOUNTER — Encounter (INDEPENDENT_AMBULATORY_CARE_PROVIDER_SITE_OTHER): Payer: Self-pay | Admitting: Family Medicine

## 2021-11-16 ENCOUNTER — Ambulatory Visit (INDEPENDENT_AMBULATORY_CARE_PROVIDER_SITE_OTHER): Payer: BC Managed Care – PPO | Admitting: Family Medicine

## 2021-11-16 VITALS — BP 109/77 | HR 73 | Temp 98.2°F | Ht 65.0 in | Wt 268.0 lb

## 2021-11-16 DIAGNOSIS — R0602 Shortness of breath: Secondary | ICD-10-CM

## 2021-11-16 DIAGNOSIS — Z6841 Body Mass Index (BMI) 40.0 and over, adult: Secondary | ICD-10-CM

## 2021-11-16 DIAGNOSIS — E1159 Type 2 diabetes mellitus with other circulatory complications: Secondary | ICD-10-CM | POA: Diagnosis not present

## 2021-11-16 DIAGNOSIS — E559 Vitamin D deficiency, unspecified: Secondary | ICD-10-CM | POA: Diagnosis not present

## 2021-11-16 DIAGNOSIS — E1169 Type 2 diabetes mellitus with other specified complication: Secondary | ICD-10-CM | POA: Diagnosis not present

## 2021-11-16 DIAGNOSIS — E7849 Other hyperlipidemia: Secondary | ICD-10-CM | POA: Diagnosis not present

## 2021-11-16 DIAGNOSIS — E669 Obesity, unspecified: Secondary | ICD-10-CM

## 2021-11-16 DIAGNOSIS — E785 Hyperlipidemia, unspecified: Secondary | ICD-10-CM | POA: Diagnosis not present

## 2021-11-16 DIAGNOSIS — E1165 Type 2 diabetes mellitus with hyperglycemia: Secondary | ICD-10-CM | POA: Diagnosis not present

## 2021-11-16 DIAGNOSIS — Z9189 Other specified personal risk factors, not elsewhere classified: Secondary | ICD-10-CM

## 2021-11-16 DIAGNOSIS — Z7984 Long term (current) use of oral hypoglycemic drugs: Secondary | ICD-10-CM

## 2021-11-17 LAB — LIPID PANEL
Chol/HDL Ratio: 1.9 ratio (ref 0.0–4.4)
Cholesterol, Total: 83 mg/dL — ABNORMAL LOW (ref 100–199)
HDL: 44 mg/dL (ref >39)
LDL Chol Calc (NIH): 26 mg/dL (ref 0–99)
Triglycerides: 49 mg/dL (ref 0–149)
VLDL Cholesterol Cal: 13 mg/dL (ref 5–40)

## 2021-11-17 LAB — VITAMIN D 25 HYDROXY (VIT D DEFICIENCY, FRACTURES): Vit D, 25-Hydroxy: 30.2 ng/mL (ref 30.0–100.0)

## 2021-11-17 LAB — CBC WITH DIFFERENTIAL/PLATELET
Basophils Absolute: 0 10*3/uL (ref 0.0–0.2)
Basos: 1 %
EOS (ABSOLUTE): 0.1 10*3/uL (ref 0.0–0.4)
Eos: 2 %
Hematocrit: 44.4 % (ref 34.0–46.6)
Hemoglobin: 14.8 g/dL (ref 11.1–15.9)
Immature Grans (Abs): 0 10*3/uL (ref 0.0–0.1)
Immature Granulocytes: 0 %
Lymphocytes Absolute: 2.1 10*3/uL (ref 0.7–3.1)
Lymphs: 32 %
MCH: 32.5 pg (ref 26.6–33.0)
MCHC: 33.3 g/dL (ref 31.5–35.7)
MCV: 97 fL (ref 79–97)
Monocytes Absolute: 0.5 10*3/uL (ref 0.1–0.9)
Monocytes: 8 %
Neutrophils Absolute: 3.8 10*3/uL (ref 1.4–7.0)
Neutrophils: 57 %
Platelets: 360 10*3/uL (ref 150–450)
RBC: 4.56 x10E6/uL (ref 3.77–5.28)
RDW: 12 % (ref 11.7–15.4)
WBC: 6.5 10*3/uL (ref 3.4–10.8)

## 2021-11-17 LAB — COMPREHENSIVE METABOLIC PANEL
ALT: 20 IU/L (ref 0–32)
AST: 14 IU/L (ref 0–40)
Albumin/Globulin Ratio: 1.3 (ref 1.2–2.2)
Albumin: 3.9 g/dL (ref 3.9–4.9)
Alkaline Phosphatase: 85 IU/L (ref 44–121)
BUN/Creatinine Ratio: 21 (ref 9–23)
BUN: 14 mg/dL (ref 6–24)
Bilirubin Total: 0.3 mg/dL (ref 0.0–1.2)
CO2: 22 mmol/L (ref 20–29)
Calcium: 9.2 mg/dL (ref 8.7–10.2)
Chloride: 99 mmol/L (ref 96–106)
Creatinine, Ser: 0.66 mg/dL (ref 0.57–1.00)
Globulin, Total: 3 g/dL (ref 1.5–4.5)
Glucose: 294 mg/dL — ABNORMAL HIGH (ref 70–99)
Potassium: 4.5 mmol/L (ref 3.5–5.2)
Sodium: 135 mmol/L (ref 134–144)
Total Protein: 6.9 g/dL (ref 6.0–8.5)
eGFR: 110 mL/min/{1.73_m2} (ref >59)

## 2021-11-17 LAB — HEMOGLOBIN A1C
Est. average glucose Bld gHb Est-mCnc: 309 mg/dL
Hgb A1c MFr Bld: 12.4 % — ABNORMAL HIGH (ref 4.8–5.6)

## 2021-11-17 LAB — INSULIN, RANDOM: INSULIN: 11.9 u[IU]/mL (ref 2.6–24.9)

## 2021-11-17 LAB — TSH: TSH: 1.98 u[IU]/mL (ref 0.450–4.500)

## 2021-11-17 LAB — T4, FREE: Free T4: 1.18 ng/dL (ref 0.82–1.77)

## 2021-11-22 NOTE — Progress Notes (Unsigned)
Chief Complaint:   OBESITY Veronica Norton is here to discuss her progress with her obesity treatment plan along with follow-up of her obesity related diagnoses. Veronica Norton is on the Category 1 Plan and states she is following her eating plan approximately 40% of the time. Veronica Norton states she is walking 30-45 minutes 2 times per week.  Today's visit was #: 14 Starting weight: 284 lbs Starting date: 02/03/2019 Today's weight: 268 lbs Today's date: 11/16/2021 Total lbs lost to date: 16 Total lbs lost since last in-office visit: 3  Interim History: Veronica Norton did better the past 4 weeks than usual. She gained 5 lbs in muscle mass and lost 9+ lbs in fat mass. She stopped drinking caloric beverages since her last OV and has no ice cream.  Subjective:   1. SOB (shortness of breath) on exertion Pt's IC in the past was 999 on 01/2019. Repeat IC today is 1814 and much improved. However, it is still less than calculated at 1925, but still we need to increase caloric intake.  2. Poorly controlled diabetes mellitus (Veronica Norton) Glipizide is not on pt's med list, however, pt endorses she takes it. She also takes Metformin twice daily.  3. Other hyperlipidemia Veronica Norton is tolerating medication(s) well without side effects.  Medication compliance is good as patient endorses taking it as prescribed.  The patient denies additional concerns regarding this condition. Medication: Crestor  4. Vitamin D deficiency She has not been on supplements all summer long and just getting sun.  5. At risk for heart disease [name] is at risk for heart disease due to poorly controlled diabetes mellitus and hyperlipidemia.  Assessment/Plan:   Orders Placed This Encounter  Procedures   VITAMIN D 25 Hydroxy (Vit-D Deficiency, Fractures)   TSH   T4, free   Lipid panel   Comprehensive metabolic panel   CBC with Differential/Platelet   Hemoglobin A1c   Insulin, random    1. SOB (shortness of breath) on exertion Veronica Norton does  feel that she gets out of breath more easily that she used to when she exercises. Veronica Norton's shortness of breath appears to be obesity related and exercise induced. She has agreed to work on weight loss and gradually increase exercise to treat her exercise induced shortness of breath. Will continue to monitor closely. Change from category 1 to category 2.  2. Poorly controlled diabetes mellitus (Veronica Norton) Good blood sugar control is important to decrease the likelihood of diabetic complications such as nephropathy, neuropathy, limb loss, blindness, coronary artery disease, and death. Intensive lifestyle modification including diet, exercise and weight loss are the first line of treatment for diabetes.  We will consider change in meds at next OV as needed.  Lab/Orders today: - TSH - T4, free - Lipid panel - Comprehensive metabolic panel - CBC with Differential/Platelet - Hemoglobin A1c - Insulin, random  3. Other hyperlipidemia Cardiovascular risk and specific lipid/LDL goals reviewed.  We discussed several lifestyle modifications today and [name] will continue to work on diet, exercise and weight loss efforts. Orders and follow up as documented in patient record.   Counseling Intensive lifestyle modifications are the first line treatment for this issue. Dietary changes: Increase soluble fiber. Decrease simple carbohydrates. Exercise changes: Moderate to vigorous-intensity aerobic activity 150 minutes per week if tolerated. Lipid-lowering medications: see documented in medical record.  Lab/Orders today: - Lipid panel - Comprehensive metabolic panel - CBC with Differential/Platelet  4. Vitamin D deficiency Low Vitamin D level contributes to fatigue and are associated with  obesity, breast, and colon cancer. She agrees to continue to hold off on supplementation pending lab results and will follow-up for routine testing of Vitamin D, at least 2-3 times per year to avoid  over-replacement.  Lab/Orders today: - VITAMIN D 25 Hydroxy (Vit-D Deficiency, Fractures)  5. At risk for heart disease Due to Veronica Norton current state of health and medical condition(s), she is at a higher risk for heart disease.  This puts the patient at much greater risk to subsequently develop cardiopulmonary conditions that can significantly affect patient's quality of life in a negative manner as well.    At least 9 minutes were spent on counseling Veronica Norton about these concerns today, and we discussed the importance of reversing risks factors of obesity, especially truncal and visceral fat, hypertension, hyperlipidemia, and pre-diabetes.  The initial goal is to lose at least 5-10% of starting weight to help reduce these risk factors.  Counseling: Intensive lifestyle modifications were discussed with Veronica Norton as the most appropriate first line of treatment.  she will continue to work on diet, exercise, and weight loss efforts.  We will continue to reassess these conditions on a fairly regular basis in an attempt to decrease the patient's overall morbidity and mortality.  Evidence-based interventions for health behavior change were utilized today including the discussion of self monitoring techniques, problem-solving barriers, and SMART goal setting techniques.  Specifically, regarding patient's less desirable eating habits and patterns, we employed the technique of small changes when [employer] has not been able to fully commit to her prudent nutritional plan.  6. Obesity, Current BMI 44.7 Veronica Norton is currently in the action stage of change. As such, her goal is to continue with weight loss efforts. She has agreed to change to the Category 2 Plan with lunch options.   Exercise goals:  As is  Behavioral modification strategies: increasing lean protein intake, decreasing simple carbohydrates, increasing water intake, and decreasing liquid  calories.  Veronica Norton has agreed to follow-up with our clinic in 3-4 weeks. She was informed of the importance of frequent follow-up visits to maximize her success with intensive lifestyle modifications for her multiple health conditions.   Veronica Norton was informed we would discuss her lab results at her next visit unless there is a critical issue that needs to be addressed sooner. Veronica Norton agreed to keep her next visit at the agreed upon time to discuss these results.  Objective:   Blood pressure 109/77, pulse 73, temperature 98.2 F (36.8 C), height '5\' 5"'$  (1.651 m), weight 268 lb (121.6 kg), SpO2 99 %. Body mass index is 44.6 kg/m.  General: Cooperative, alert, well developed, in no acute distress. HEENT: Conjunctivae and lids unremarkable. Cardiovascular: Regular rhythm.  Lungs: Normal work of breathing. Neurologic: No focal deficits.   Lab Results  Component Value Date   CREATININE 0.66 11/16/2021   BUN 14 11/16/2021   NA 135 11/16/2021   K 4.5 11/16/2021   CL 99 11/16/2021   CO2 22 11/16/2021   Lab Results  Component Value Date   ALT 20 11/16/2021   AST 14 11/16/2021   ALKPHOS 85 11/16/2021   BILITOT 0.3 11/16/2021   Lab Results  Component Value Date   HGBA1C 12.4 (H) 11/16/2021   HGBA1C 10.0 (H) 07/01/2021   HGBA1C 10.7 (H) 03/02/2021   HGBA1C 8.9 (A) 11/24/2020   HGBA1C 9.6 (H) 08/06/2020   Lab Results  Component Value Date   INSULIN 11.9 11/16/2021   Lab Results  Component  Value Date   TSH 1.980 11/16/2021   Lab Results  Component Value Date   CHOL 83 (L) 11/16/2021   HDL 44 11/16/2021   LDLCALC 26 11/16/2021   LDLDIRECT 23.0 01/21/2020   TRIG 49 11/16/2021   CHOLHDL 1.9 11/16/2021   Lab Results  Component Value Date   VD25OH 30.2 11/16/2021   VD25OH 22.62 (L) 05/07/2020   VD25OH 22.97 (L) 02/27/2020   Lab Results  Component Value Date   WBC 6.5 11/16/2021   HGB 14.8 11/16/2021   HCT 44.4 11/16/2021   MCV 97 11/16/2021   PLT 360 11/16/2021     Attestation Statements:   Reviewed by clinician on day of visit: allergies, medications, problem list, medical history, surgical history, family history, social history, and previous encounter notes.  I, Kathlene November, BS, CMA, am acting as transcriptionist for [employer], DO.    I have reviewed the above documentation for accuracy and completeness, and I agree with the above. Marjory Sneddon, D.O.  The [place] was signed into law in 2016 which includes the topic of electronic health records.  This provides immediate access to information in MyChart.  This includes consultation notes, operative notes, office notes, lab results and pathology reports.  If you have any questions about what you read please let us know at your next visit so we can discuss your concerns and take corrective action if need be.  We are right here with you.

## 2021-11-23 ENCOUNTER — Other Ambulatory Visit: Payer: Self-pay

## 2021-11-28 ENCOUNTER — Ambulatory Visit: Payer: BC Managed Care – PPO | Admitting: Family Medicine

## 2021-12-14 ENCOUNTER — Encounter (INDEPENDENT_AMBULATORY_CARE_PROVIDER_SITE_OTHER): Payer: Self-pay | Admitting: Family Medicine

## 2021-12-14 ENCOUNTER — Ambulatory Visit (INDEPENDENT_AMBULATORY_CARE_PROVIDER_SITE_OTHER): Payer: BC Managed Care – PPO | Admitting: Family Medicine

## 2021-12-14 VITALS — BP 122/86 | HR 65 | Temp 98.4°F | Ht 65.0 in | Wt 269.0 lb

## 2021-12-14 DIAGNOSIS — Z9189 Other specified personal risk factors, not elsewhere classified: Secondary | ICD-10-CM

## 2021-12-14 DIAGNOSIS — E1169 Type 2 diabetes mellitus with other specified complication: Secondary | ICD-10-CM | POA: Diagnosis not present

## 2021-12-14 DIAGNOSIS — G4709 Other insomnia: Secondary | ICD-10-CM

## 2021-12-14 DIAGNOSIS — E785 Hyperlipidemia, unspecified: Secondary | ICD-10-CM | POA: Diagnosis not present

## 2021-12-14 DIAGNOSIS — E669 Obesity, unspecified: Secondary | ICD-10-CM

## 2021-12-14 DIAGNOSIS — E66813 Obesity, class 3: Secondary | ICD-10-CM

## 2021-12-14 DIAGNOSIS — E559 Vitamin D deficiency, unspecified: Secondary | ICD-10-CM

## 2021-12-14 DIAGNOSIS — Z6841 Body Mass Index (BMI) 40.0 and over, adult: Secondary | ICD-10-CM

## 2021-12-14 MED ORDER — VITAMIN D (ERGOCALCIFEROL) 1.25 MG (50000 UNIT) PO CAPS: 50000.0000 [IU] | ORAL_CAPSULE | ORAL | 0 refills | Status: DC

## 2021-12-21 ENCOUNTER — Ambulatory Visit: Payer: BC Managed Care – PPO | Admitting: Family Medicine

## 2021-12-21 ENCOUNTER — Encounter: Payer: Self-pay | Admitting: Family Medicine

## 2021-12-21 VITALS — BP 120/70 | HR 82 | Temp 97.8°F | Ht 65.0 in | Wt 270.0 lb

## 2021-12-21 DIAGNOSIS — E1169 Type 2 diabetes mellitus with other specified complication: Secondary | ICD-10-CM

## 2021-12-21 DIAGNOSIS — Z23 Encounter for immunization: Secondary | ICD-10-CM

## 2021-12-21 DIAGNOSIS — E785 Hyperlipidemia, unspecified: Secondary | ICD-10-CM

## 2021-12-21 DIAGNOSIS — E1159 Type 2 diabetes mellitus with other circulatory complications: Secondary | ICD-10-CM

## 2021-12-21 DIAGNOSIS — I152 Hypertension secondary to endocrine disorders: Secondary | ICD-10-CM

## 2021-12-21 DIAGNOSIS — E669 Obesity, unspecified: Secondary | ICD-10-CM

## 2021-12-21 MED ORDER — SEMAGLUTIDE(0.25 OR 0.5MG/DOS) 2 MG/3ML ~~LOC~~ SOPN: PEN_INJECTOR | SUBCUTANEOUS | 3 refills | Status: DC

## 2021-12-21 NOTE — Patient Instructions (Signed)
Nice to see you.  We will start you on ozempic. If you have any of the side effects we discussed please let us know right away.

## 2021-12-21 NOTE — Assessment & Plan Note (Signed)
Continue crestor 20 mg daily.  

## 2021-12-21 NOTE — Assessment & Plan Note (Signed)
Well controlled. Taking HCTZ 12.5 mg daily.

## 2021-12-21 NOTE — Progress Notes (Signed)
Tommi Rumps, MD Phone: [phone]  Veronica Norton is a 46 y.o. female who presents today for f/u.  HYPERTENSION Disease Monitoring: Blood pressure range-not checking Chest pain- no      Dyspnea- no Medications: Compliance- taking HCTZ    Edema- no  DIABETES Disease Monitoring: Blood Sugar ranges-not checking Polyuria/phagia/dipsia- no      Optho- scheduled Medications: Compliance- taking metformin, glipizide Hypoglycemic symptoms- no See endocrinology in January. Jardiance gave her yeast infections. Insurance stopped paying for [employer].   HYPERLIPIDEMIA Disease Monitoring: See symptoms for Hypertension Medications: Compliance- taking crestor Right upper quadrant pain- no  Muscle aches- no    Social History   Tobacco Use  Smoking Status Never  Smokeless Tobacco Never    Current Outpatient Medications on File Prior to Visit  Medication Sig Dispense Refill   Continuous Blood Gluc Sensor (FREESTYLE LIBRE 3 SENSOR) MISC Apply 1 each topically every 14 (fourteen) days. Place 1 sensor on the skin every 14 days. Use to check glucose continuously 2 each 11   fluticasone (CUTIVATE) 0.05 % cream APPLY TO AFFECTED AREA UP TO TWICE A DAY AS NEEDED FOR RASH/ITCHING 60 g 0   glipiZIDE (GLUCOTROL) 5 MG tablet Take 5 mg by mouth 2 (two) times daily.     glucose monitoring kit (FREESTYLE) monitoring kit Use as directed to check sugars bid 1 each 0   hydrochlorothiazide (MICROZIDE) 12.5 MG capsule TAKE 1 CAPSULE BY MOUTH EVERY DAY 90 capsule 1   lisdexamfetamine (VYVANSE) 10 MG capsule Take 1 capsule (10 mg total) by mouth daily. 30 capsule 0   metFORMIN (GLUCOPHAGE-XR) 500 MG 24 hr tablet TAKE 2 TABLETS BY MOUTH TWICE A DAY 360 tablet 1   norethindrone (CAMILA) 0.35 MG tablet Take 1 tablet (0.35 mg total) by mouth daily. 28 tablet 6   rosuvastatin (CRESTOR) 20 MG tablet TAKE 1 TABLET BY MOUTH EVERY DAY 90 tablet 3   Vitamin D, Ergocalciferol, (DRISDOL) 1.25 MG (50000 UNIT) CAPS  capsule Take 1 capsule (50,000 Units total) by mouth every 7 (seven) days. 12 capsule 0   No current facility-administered medications on file prior to visit.     ROS see history of present illness  Objective  Physical Exam Vitals:   12/21/21 1613  BP: 120/70  Pulse: 82  Temp: 97.8 F (36.6 C)  SpO2: 97%    BP Readings from Last 3 Encounters:  12/21/21 120/70  12/14/21 122/86  11/16/21 109/77   Wt Readings from Last 3 Encounters:  12/21/21 270 lb (122.5 kg)  12/14/21 269 lb (122 kg)  11/16/21 268 lb (121.6 kg)    Physical Exam Constitutional:      General: She is not in acute distress.    Appearance: She is not diaphoretic.  Cardiovascular:     Rate and Rhythm: Normal rate and regular rhythm.     Heart sounds: Normal heart sounds.  Pulmonary:     Effort: Pulmonary effort is normal.     Breath sounds: Normal breath sounds.  Skin:    General: Skin is warm and dry.  Neurological:     Mental Status: She is alert.    Diabetic Foot Exam - Simple   Simple Foot Form Diabetic Foot exam was performed with the following findings: Yes 12/21/2021  4:36 PM  Visual Inspection See comments: Yes Sensation Testing Intact to touch and monofilament testing bilaterally: Yes Pulse Check Posterior Tibialis and Dorsalis pulse intact bilaterally: Yes Comments Pes planus, otherwise no deformities, ulcerations, or skin break down  Assessment/Plan: Please see individual problem list.  Problem List Items Addressed This Visit     Diabetes mellitus type 2 in obese (HCC) (Chronic)    Uncontrolled. We will start ozempic 0.25 mg weekly for 4 weeks and then increase to 0.5 mg weekly. She will continue glipizide 5 mg BID and metformin xr 1000 mg BID. She will keep her appointment with endocrinology in January.   We will start the patient on ozempic.  They were counseled on the risk of pancreatitis and gallbladder disease.  Discussed the risk of nausea.  They were advised to  discontinue the ozempic and contact us immediately if they develop abdominal pain.  If they develop excessive nausea they will contact us right away.  I discussed that medullary thyroid cancer has been seen in rats studies.  The patient confirmed no personal or family history of thyroid cancer, parathyroid cancer, or adrenal gland cancer.  Discussed that we thus far have not seen medullary thyroid cancer result from use of this type of medication in humans.  They were advised to monitor the thyroid area and contact us for any lumps, swelling, trouble swallowing, or any other changes in this area.        Relevant Medications   Semaglutide,0.25 or 0.5MG/DOS, 2 MG/3ML SOPN   Hypertension associated with diabetes ([place]) - Primary (Chronic)    Well controlled. Taking HCTZ 12.5 mg daily.      Relevant Medications   Semaglutide,0.25 or 0.5MG/DOS, 2 MG/3ML SOPN   Hyperlipidemia associated with type 2 diabetes mellitus ([place])    Continue crestor 20 mg daily.       Relevant Medications   Semaglutide,0.25 or 0.5MG/DOS, 2 MG/3ML SOPN   Other Visit Diagnoses     Need for immunization against influenza       Relevant Orders   Flu Vaccine QUAD 53moIM (Fluarix, Fluzone & Alfiuria Quad PF) (Completed)        Health Maintenance: she will ask GYN and optho to send copies of notes when she see's them in the near future.   Return in about 3 months (around 03/23/2022) for dm.   ETommi Rumps MD [facility]

## 2021-12-21 NOTE — Assessment & Plan Note (Addendum)
Uncontrolled. We will start ozempic 0.25 mg weekly for 4 weeks and then increase to 0.5 mg weekly. She will continue glipizide 5 mg BID and metformin xr 1000 mg BID. She will keep her appointment with endocrinology in January.   We will start the patient on ozempic.  They were counseled on the risk of pancreatitis and gallbladder disease.  Discussed the risk of nausea.  They were advised to discontinue the ozempic and contact us immediately if they develop abdominal pain.  If they develop excessive nausea they will contact us right away.  I discussed that medullary thyroid cancer has been seen in rats studies.  The patient confirmed no personal or family history of thyroid cancer, parathyroid cancer, or adrenal gland cancer.  Discussed that we thus far have not seen medullary thyroid cancer result from use of this type of medication in humans.  They were advised to monitor the thyroid area and contact us for any lumps, swelling, trouble swallowing, or any other changes in this area.

## 2021-12-24 NOTE — Progress Notes (Unsigned)
Chief Complaint:   OBESITY Veronica Norton is here to discuss her progress with her obesity treatment plan along with follow-up of her obesity related diagnoses. Veronica Norton is on the Category 2 Plan with lunch options and states she is following her eating plan approximately 50% of the time. Veronica Norton states she is walking 45 minutes 2 times per week.  Today's visit was #: 66 Starting weight: 284 lbs Starting date: 02/03/2019 Today's weight: 269 lbs Today's date: 12/14/2021 Total lbs lost to date: 15 Total lbs lost since last in-office visit: +1  Interim History: Veronica Norton has not been following meal plan and has no questions or concerns about the plan. She lost papers on category 2 that were given to her at last [facility] labs today.  Subjective:   1. Type 2 diabetes mellitus with other specified complication, without long-term current use of insulin (Veronica Norton) Discussed labs with patient today. Veronica Norton's A1c is 12.4 with an insulin level of 11.9. Pt had insulin written in the past but states she never started it. She is only taking Metformin. Pt with poor diet and hasn't made changes yet.  2. Hyperlipidemia associated with type 2 diabetes mellitus (Veronica Norton) Discussed labs with patient today. Veronica Norton has hyperlipidemia and has been trying to improve her cholesterol levels with intensive lifestyle modifications including a low saturated fat diet, exercise, and weight loss. She denies any chest pain, claudication, or myalgias.  3. Vitamin D deficiency Discussed labs with patient today. Pt has not been taking her OTC Vit D supplement and reports being tired and achy.  4. Other insomnia Discussed labs with patient today. Pt reports difficulty falling asleep. She uses her cell phone in bed.  5. At risk for heart disease Veronica Norton is at risk for heart disease due to poorly controlled diabetes mellitus  Assessment/Plan:  No orders of the defined types were placed in this encounter.   Medications Discontinued During  This Encounter  Medication Reason   clobetasol ointment (TEMOVATE) 0.05 %    Dapsone (ACZONE) 7.5 % GEL    hydrocortisone 2.5 % cream      Meds ordered this encounter  Medications   Vitamin D, Ergocalciferol, (DRISDOL) 1.25 MG (50000 UNIT) CAPS capsule    Sig: Take 1 capsule (50,000 Units total) by mouth every 7 (seven) days.    Dispense:  12 capsule    Refill:  0    Needs OV for RF- DO NOT RF w/o a provider's script     1. Type 2 diabetes mellitus with other specified complication, without long-term current use of insulin (HCC) Good blood sugar control is important to decrease the likelihood of diabetic complications such as nephropathy, neuropathy, limb loss, blindness, coronary artery disease, and death. Intensive lifestyle modification including diet, exercise and weight loss are the first line of treatment for diabetes.  Pt advised it is VERY important to follow up with her PCP for medical management of her very poorly controlled diabetes. Long discussion with pt about the detriments to her health and an  A1c at this level will cause possible nerve, kidney, and vascular issues for her. F/u with PCP for medication management.   2. Hyperlipidemia associated with type 2 diabetes mellitus (Veronica Norton) LDL and HDL at goal. Cardiovascular risk and specific lipid/LDL goals reviewed.  We discussed several lifestyle modifications today and Veronica Norton will continue to work on diet, exercise and weight loss efforts. Orders and follow up as documented in patient record. Continue meds per PCP.  Counseling Intensive lifestyle modifications  are the first line treatment for this issue. Dietary changes: Increase soluble fiber. Decrease simple carbohydrates. Exercise changes: Moderate to vigorous-intensity aerobic activity 150 minutes per week if tolerated. Lipid-lowering medications: see documented in medical record.  3. Vitamin D deficiency Not at goal. - I again reiterated the importance of vitamin D (as  well as calcium) to their health and wellbeing.  - I reviewed possible symptoms of low Vitamin D:  low energy, depressed mood, muscle aches, joint aches, osteoporosis etc. - low Vitamin D levels may be linked to an increased risk of cardiovascular events and even increased risk of cancers- such as colon and breast.  - ideal vitamin D levels reviewed with patient  - I recommend pt take a 50,000 IU weekly prescription vit D - see script below   - Informed patient this may be a lifelong thing, and she was encouraged to continue to take the medicine until told otherwise.    - weight loss will likely improve availability of vitamin D, thus encouraged Veronica Norton to continue with meal plan and their weight loss efforts to further improve this condition.  Thus, we will need to monitor levels regularly (every 3-4 mo on average) to keep levels within normal limits and prevent over supplementation. - pt's questions and concerns regarding this condition addressed.  Start- Vitamin D, Ergocalciferol, (DRISDOL) 1.25 MG (50000 UNIT) CAPS capsule; Take 1 capsule (50,000 Units total) by mouth every 7 (seven) days.  Dispense: 12 capsule; Refill: 0  4. Other insomnia Decrease blue light exposure. No stimuli 2 hours prior to bedtime. Counseling done on circadian rhythm and melatonin release in body.  5. At risk for heart disease Due to [place] current state of health and medical condition(s), she is at a higher risk for heart disease.  This puts the patient at much greater risk to subsequently develop cardiopulmonary conditions that can significantly affect patient's quality of life in a negative manner as well.    At least 9 minutes were spent on counseling Veronica Norton about these concerns today, and we discussed the importance of reversing risks factors of obesity, especially truncal and visceral fat, hypertension, hyperlipidemia, and pre-diabetes.  The initial goal is to lose at least 5-10% of  starting weight to help reduce these risk factors.  Counseling: Intensive lifestyle modifications were discussed with Veronica Norton as the most appropriate first line of treatment.  she will continue to work on diet, exercise, and weight loss efforts.  We will continue to reassess these conditions on a fairly regular basis in an attempt to decrease the patient's overall morbidity and mortality.  Evidence-based interventions for health behavior change were utilized today including the discussion of self monitoring techniques, problem-solving barriers, and SMART goal setting techniques.  Specifically, regarding patient's less desirable eating habits and patterns, we employed the technique of small changes when Veronica Norton has not been able to fully commit to her prudent nutritional plan.  6. Obesity, Current BMI 44.8 Veronica Norton is currently in the action stage of change. As such, her goal is to continue with weight loss efforts. She has agreed to the Category 2 Plan with lunch options.   Exercise goals:  As is and increase as tolerated.  Behavioral modification strategies: avoiding temptations and planning for success.  Veronica Norton has agreed to follow-up with our clinic in 12 weeks. She was informed of the importance of frequent follow-up visits to maximize her success with intensive lifestyle modifications for her multiple health conditions.  Objective:   Blood pressure 122/86, pulse 65, temperature 98.4 F (36.9 C), height '5\' 5"'$  (1.651 m), weight 269 lb (122 kg), SpO2 99 %. Body mass index is 44.76 kg/m.  General: Cooperative, alert, well developed, in no acute distress. HEENT: Conjunctivae and lids unremarkable. Cardiovascular: Regular rhythm.  Lungs: Normal work of breathing. Neurologic: No focal deficits.   Lab Results  Component Value Date   CREATININE 0.66 11/16/2021   BUN 14 11/16/2021   NA 135 11/16/2021   K 4.5 11/16/2021   CL 99 11/16/2021   CO2 22 11/16/2021   Lab  Results  Component Value Date   ALT 20 11/16/2021   AST 14 11/16/2021   ALKPHOS 85 11/16/2021   BILITOT 0.3 11/16/2021   Lab Results  Component Value Date   HGBA1C 12.4 (H) 11/16/2021   HGBA1C 10.0 (H) 07/01/2021   HGBA1C 10.7 (H) 03/02/2021   HGBA1C 8.9 (A) 11/24/2020   HGBA1C 9.6 (H) 08/06/2020   Lab Results  Component Value Date   INSULIN 11.9 11/16/2021   Lab Results  Component Value Date   TSH 1.980 11/16/2021   Lab Results  Component Value Date   CHOL 83 (L) 11/16/2021   HDL 44 11/16/2021   LDLCALC 26 11/16/2021   LDLDIRECT 23.0 01/21/2020   TRIG 49 11/16/2021   CHOLHDL 1.9 11/16/2021   Lab Results  Component Value Date   VD25OH 30.2 11/16/2021   VD25OH 22.62 (L) 05/07/2020   VD25OH 22.97 (L) 02/27/2020   Lab Results  Component Value Date   WBC 6.5 11/16/2021   HGB 14.8 11/16/2021   HCT 44.4 11/16/2021   MCV 97 11/16/2021   PLT 360 11/16/2021    Attestation Statements:   Reviewed by clinician on day of visit: allergies, medications, problem list, medical history, surgical history, family history, social history, and previous encounter notes.  I, Kathlene November, BS, CMA, am acting as transcriptionist for [employer], DO.   I have reviewed the above documentation for accuracy and completeness, and I agree with the above. Veronica Norton, D.O.  The [place] was signed into law in 2016 which includes the topic of electronic health records.  This provides immediate access to information in MyChart.  This includes consultation notes, operative notes, office notes, lab results and pathology reports.  If you have any questions about what you read please let us know at your next visit so we can discuss your concerns and take corrective action if need be.  We are right here with you.

## 2022-02-06 DIAGNOSIS — Z1231 Encounter for screening mammogram for malignant neoplasm of breast: Secondary | ICD-10-CM | POA: Diagnosis not present

## 2022-02-08 DIAGNOSIS — Z01419 Encounter for gynecological examination (general) (routine) without abnormal findings: Secondary | ICD-10-CM | POA: Diagnosis not present

## 2022-02-08 DIAGNOSIS — Z3041 Encounter for surveillance of contraceptive pills: Secondary | ICD-10-CM | POA: Diagnosis not present

## 2022-02-08 DIAGNOSIS — Z1159 Encounter for screening for other viral diseases: Secondary | ICD-10-CM | POA: Diagnosis not present

## 2022-02-22 LAB — HM DIABETES EYE EXAM

## 2022-02-24 DIAGNOSIS — N898 Other specified noninflammatory disorders of vagina: Secondary | ICD-10-CM | POA: Diagnosis not present

## 2022-02-24 DIAGNOSIS — B3731 Acute candidiasis of vulva and vagina: Secondary | ICD-10-CM | POA: Diagnosis not present

## 2022-02-24 DIAGNOSIS — A5901 Trichomonal vulvovaginitis: Secondary | ICD-10-CM | POA: Diagnosis not present

## 2022-02-24 DIAGNOSIS — R768 Other specified abnormal immunological findings in serum: Secondary | ICD-10-CM | POA: Diagnosis not present

## 2022-03-16 ENCOUNTER — Ambulatory Visit (INDEPENDENT_AMBULATORY_CARE_PROVIDER_SITE_OTHER): Payer: BC Managed Care – PPO | Admitting: Family Medicine

## 2022-03-24 ENCOUNTER — Inpatient Hospital Stay
Admission: EM | Admit: 2022-03-24 | Discharge: 2022-03-27 | DRG: 605 | Disposition: A | Discharge status: Home / Self Care | Payer: BC Managed Care – PPO | Attending: Student | Admitting: Student

## 2022-03-24 ENCOUNTER — Encounter: Payer: Self-pay | Admitting: Internal Medicine

## 2022-03-24 ENCOUNTER — Emergency Department: Payer: BC Managed Care – PPO

## 2022-03-24 ENCOUNTER — Other Ambulatory Visit: Payer: Self-pay

## 2022-03-24 DIAGNOSIS — Z8249 Family history of ischemic heart disease and other diseases of the circulatory system: Secondary | ICD-10-CM

## 2022-03-24 DIAGNOSIS — Z23 Encounter for immunization: Secondary | ICD-10-CM

## 2022-03-24 DIAGNOSIS — L03113 Cellulitis of right upper limb: Secondary | ICD-10-CM | POA: Diagnosis not present

## 2022-03-24 DIAGNOSIS — Z6841 Body Mass Index (BMI) 40.0 and over, adult: Secondary | ICD-10-CM

## 2022-03-24 DIAGNOSIS — E785 Hyperlipidemia, unspecified: Secondary | ICD-10-CM | POA: Diagnosis not present

## 2022-03-24 DIAGNOSIS — Z823 Family history of stroke: Secondary | ICD-10-CM

## 2022-03-24 DIAGNOSIS — S60551A Superficial foreign body of right hand, initial encounter: Secondary | ICD-10-CM | POA: Diagnosis not present

## 2022-03-24 DIAGNOSIS — Z7984 Long term (current) use of oral hypoglycemic drugs: Secondary | ICD-10-CM | POA: Diagnosis not present

## 2022-03-24 DIAGNOSIS — I152 Hypertension secondary to endocrine disorders: Secondary | ICD-10-CM | POA: Diagnosis not present

## 2022-03-24 DIAGNOSIS — Y929 Unspecified place or not applicable: Secondary | ICD-10-CM | POA: Diagnosis not present

## 2022-03-24 DIAGNOSIS — W540XXA Bitten by dog, initial encounter: Secondary | ICD-10-CM

## 2022-03-24 DIAGNOSIS — S61451A Open bite of right hand, initial encounter: Secondary | ICD-10-CM | POA: Diagnosis present

## 2022-03-24 DIAGNOSIS — L089 Local infection of the skin and subcutaneous tissue, unspecified: Secondary | ICD-10-CM | POA: Diagnosis not present

## 2022-03-24 DIAGNOSIS — L03011 Cellulitis of right finger: Secondary | ICD-10-CM | POA: Diagnosis present

## 2022-03-24 DIAGNOSIS — E1169 Type 2 diabetes mellitus with other specified complication: Secondary | ICD-10-CM | POA: Diagnosis not present

## 2022-03-24 DIAGNOSIS — E669 Obesity, unspecified: Secondary | ICD-10-CM | POA: Diagnosis not present

## 2022-03-24 DIAGNOSIS — S61254A Open bite of right ring finger without damage to nail, initial encounter: Principal | ICD-10-CM | POA: Diagnosis present

## 2022-03-24 DIAGNOSIS — L680 Hirsutism: Secondary | ICD-10-CM | POA: Diagnosis not present

## 2022-03-24 DIAGNOSIS — E871 Hypo-osmolality and hyponatremia: Secondary | ICD-10-CM | POA: Diagnosis not present

## 2022-03-24 DIAGNOSIS — E119 Type 2 diabetes mellitus without complications: Secondary | ICD-10-CM | POA: Diagnosis present

## 2022-03-24 DIAGNOSIS — Z79899 Other long term (current) drug therapy: Secondary | ICD-10-CM | POA: Diagnosis not present

## 2022-03-24 DIAGNOSIS — E1159 Type 2 diabetes mellitus with other circulatory complications: Secondary | ICD-10-CM | POA: Diagnosis present

## 2022-03-24 DIAGNOSIS — Z91148 Patient's other noncompliance with medication regimen for other reason: Secondary | ICD-10-CM

## 2022-03-24 DIAGNOSIS — E1165 Type 2 diabetes mellitus with hyperglycemia: Secondary | ICD-10-CM | POA: Diagnosis present

## 2022-03-24 DIAGNOSIS — E66813 Obesity, class 3: Secondary | ICD-10-CM | POA: Diagnosis present

## 2022-03-24 DIAGNOSIS — Z833 Family history of diabetes mellitus: Secondary | ICD-10-CM

## 2022-03-24 LAB — CBC WITH DIFFERENTIAL/PLATELET
Abs Immature Granulocytes: 0.03 10*3/uL (ref 0.00–0.07)
Basophils Absolute: 0 10*3/uL (ref 0.0–0.1)
Basophils Relative: 0 %
Eosinophils Absolute: 0.1 10*3/uL (ref 0.0–0.5)
Eosinophils Relative: 1 %
HCT: 42.3 % (ref 36.0–46.0)
Hemoglobin: 14.1 g/dL (ref 12.0–15.0)
Immature Granulocytes: 0 %
Lymphocytes Relative: 25 %
Lymphs Abs: 2.6 10*3/uL (ref 0.7–4.0)
MCH: 31.9 pg (ref 26.0–34.0)
MCHC: 33.3 g/dL (ref 30.0–36.0)
MCV: 95.7 fL (ref 80.0–100.0)
Monocytes Absolute: 0.6 10*3/uL (ref 0.1–1.0)
Monocytes Relative: 6 %
Neutro Abs: 7 10*3/uL (ref 1.7–7.7)
Neutrophils Relative %: 68 %
Platelets: 350 10*3/uL (ref 150–400)
RBC: 4.42 MIL/uL (ref 3.87–5.11)
RDW: 11.9 % (ref 11.5–15.5)
WBC: 10.3 10*3/uL (ref 4.0–10.5)
nRBC: 0 % (ref 0.0–0.2)

## 2022-03-24 LAB — BASIC METABOLIC PANEL
Anion gap: 8 (ref 5–15)
BUN: 13 mg/dL (ref 6–20)
CO2: 25 mmol/L (ref 22–32)
Calcium: 8.3 mg/dL — ABNORMAL LOW (ref 8.9–10.3)
Chloride: 100 mmol/L (ref 98–111)
Creatinine, Ser: 0.6 mg/dL (ref 0.44–1.00)
GFR, Estimated: >60 mL/min (ref >60)
Glucose, Bld: 340 mg/dL — ABNORMAL HIGH (ref 70–99)
Potassium: 4.2 mmol/L (ref 3.5–5.1)
Sodium: 133 mmol/L — ABNORMAL LOW (ref 135–145)

## 2022-03-24 LAB — CBG MONITORING, ED: Glucose-Capillary: 288 mg/dL — ABNORMAL HIGH (ref 70–99)

## 2022-03-24 LAB — PROTIME-INR
INR: 1 (ref 0.8–1.2)
Prothrombin Time: 13.2 seconds (ref 11.4–15.2)

## 2022-03-24 LAB — GLUCOSE, CAPILLARY: Glucose-Capillary: 280 mg/dL — ABNORMAL HIGH (ref 70–99)

## 2022-03-24 LAB — APTT: aPTT: 26 seconds (ref 24–36)

## 2022-03-24 LAB — SEDIMENTATION RATE: Sed Rate: 16 mm/hr (ref 0–20)

## 2022-03-24 LAB — PREGNANCY, URINE: Preg Test, Ur: NEGATIVE

## 2022-03-24 MED ORDER — TETANUS-DIPHTH-ACELL PERTUSSIS 5-2.5-18.5 LF-MCG/0.5 IM SUSY
0.5000 mL | PREFILLED_SYRINGE | Freq: Once | INTRAMUSCULAR | Status: AC
  Administered 2022-03-24: 0.5 mL via INTRAMUSCULAR
  Filled 2022-03-24: qty 0.5

## 2022-03-24 MED ORDER — SODIUM CHLORIDE 0.9 % IV BOLUS
1000.0000 mL | Freq: Once | INTRAVENOUS | Status: AC
  Administered 2022-03-24: 1000 mL via INTRAVENOUS

## 2022-03-24 MED ORDER — ROSUVASTATIN CALCIUM 20 MG PO TABS
20.0000 mg | ORAL_TABLET | Freq: Every day | ORAL | Status: DC
  Administered 2022-03-25 – 2022-03-27 (×3): 20 mg via ORAL
  Filled 2022-03-24 (×4): qty 1

## 2022-03-24 MED ORDER — HYDROCHLOROTHIAZIDE 12.5 MG PO TABS
12.5000 mg | ORAL_TABLET | Freq: Every day | ORAL | Status: DC
  Administered 2022-03-25 – 2022-03-27 (×3): 12.5 mg via ORAL
  Filled 2022-03-24 (×3): qty 1

## 2022-03-24 MED ORDER — HYDRALAZINE HCL 20 MG/ML IJ SOLN: 5.0000 mg | INTRAMUSCULAR | Status: DC | PRN

## 2022-03-24 MED ORDER — ONDANSETRON HCL 4 MG/2ML IJ SOLN: 4.0000 mg | Freq: Three times a day (TID) | INTRAMUSCULAR | Status: DC | PRN

## 2022-03-24 MED ORDER — INSULIN ASPART 100 UNIT/ML IJ SOLN
0.0000 [IU] | Freq: Three times a day (TID) | INTRAMUSCULAR | Status: DC
  Administered 2022-03-24: 5 [IU] via SUBCUTANEOUS
  Filled 2022-03-24: qty 1

## 2022-03-24 MED ORDER — INSULIN ASPART 100 UNIT/ML IJ SOLN
0.0000 [IU] | Freq: Every day | INTRAMUSCULAR | Status: DC
  Administered 2022-03-24: 3 [IU] via SUBCUTANEOUS
  Filled 2022-03-24: qty 1

## 2022-03-24 MED ORDER — HEPARIN SODIUM (PORCINE) 5000 UNIT/ML IJ SOLN: 5000.0000 [IU] | Freq: Three times a day (TID) | INTRAMUSCULAR | Status: DC

## 2022-03-24 MED ORDER — VANCOMYCIN HCL IN DEXTROSE 1-5 GM/200ML-% IV SOLN
1000.0000 mg | Freq: Once | INTRAVENOUS | Status: AC
  Administered 2022-03-24: 1000 mg via INTRAVENOUS
  Filled 2022-03-24: qty 200

## 2022-03-24 MED ORDER — SODIUM CHLORIDE 0.9 % IV SOLN
3.0000 g | Freq: Once | INTRAVENOUS | Status: AC
  Administered 2022-03-24: 3 g via INTRAVENOUS
  Filled 2022-03-24: qty 8

## 2022-03-24 MED ORDER — OXYCODONE-ACETAMINOPHEN 5-325 MG PO TABS: 1.0000 | ORAL_TABLET | ORAL | Status: DC | PRN

## 2022-03-24 MED ORDER — KETOROLAC TROMETHAMINE 15 MG/ML IJ SOLN
15.0000 mg | Freq: Once | INTRAMUSCULAR | Status: AC
  Administered 2022-03-24: 15 mg via INTRAVENOUS
  Filled 2022-03-24: qty 1

## 2022-03-24 MED ORDER — SODIUM CHLORIDE 0.9 % IV SOLN: INTRAVENOUS | Status: DC

## 2022-03-24 MED ORDER — ACETAMINOPHEN 325 MG PO TABS
650.0000 mg | ORAL_TABLET | Freq: Four times a day (QID) | ORAL | Status: DC | PRN
  Administered 2022-03-25: 650 mg via ORAL
  Filled 2022-03-24: qty 2

## 2022-03-24 MED ORDER — VANCOMYCIN HCL 1500 MG/300ML IV SOLN
1500.0000 mg | Freq: Once | INTRAVENOUS | Status: AC
  Administered 2022-03-24: 1500 mg via INTRAVENOUS
  Filled 2022-03-24 (×2): qty 300

## 2022-03-24 MED ORDER — SODIUM CHLORIDE 0.9 % IV SOLN
3.0000 g | Freq: Four times a day (QID) | INTRAVENOUS | Status: DC
  Administered 2022-03-24 – 2022-03-27 (×11): 3 g via INTRAVENOUS
  Filled 2022-03-24 (×3): qty 3
  Filled 2022-03-24: qty 8
  Filled 2022-03-24 (×2): qty 3
  Filled 2022-03-24 (×2): qty 8
  Filled 2022-03-24 (×4): qty 3

## 2022-03-24 NOTE — H&P (Signed)
History and Physical    Veronica Norton IHK:[id number] DOB: 09/09/1975 DOA: 03/24/2022  Referring MD/NP/PA:   PCP: Leone Haven, MD   Patient coming from:  The patient is coming from home.  At baseline, pt is independent for most of ADL.        Chief Complaint: right hand pain after dog bite  HPI: Veronica Norton is a 47 y.o. female with medical history significant of hypertension, hyperlipidemia, diabetes mellitus, obesity, who presents with right hand pain after dog bite.  Pt state that states that she was bit by her dog 2 days ago. She developed pain in her right ring finger where she sustained a small puncture wound along the palmar surface of the finger.  The pain has been worsening, which is constant, throbbing, 9 out of 10 in severity, nonradiating.  Her right ring finger is erythematous and swelling. Patient does not have fever or chills.  No nausea, vomiting, diarrhea or abdominal pain.  No symptoms of UTI.  No chest pain, cough, shortness breath.  Data reviewed independently and ED Course: pt was found to have WBC 10.3, GFR> 60, temperature normal, blood pressure 144/81, heart rate 84, RR 18, oxygen saturation 96% on room air.  X-ray of right hand is negative for OT abnormalities.  Patient is placed on MedSurg bed for observation.  Message sent to Dr. Sharlet Salina of Ortho for consultation.   EKG: Not done in ED, will get one.      Review of Systems:   General: no fevers, chills, no body weight gain, fatigue HEENT: no blurry vision, hearing changes or sore throat Respiratory: no dyspnea, coughing, wheezing CV: no chest pain, no palpitations GI: no nausea, vomiting, abdominal pain, diarrhea, constipation GU: no dysuria, burning on urination, increased urinary frequency, hematuria  Ext: no leg edema. Has pain in right ring finger Neuro: no unilateral weakness, numbness, or tingling, no vision change or hearing loss Skin: no rash, no skin tear. MSK: No muscle spasm,  no deformity, no limitation of range of movement in spin Heme: No easy bruising.  Travel history: No recent long distant travel.   Allergy: No Known Allergies  Past Medical History:  Diagnosis Date   Diabetes mellitus    H/O pilonidal cyst    Hypertension    Normal cardiac stress test 2009   Pre-diabetes     Past Surgical History:  Procedure Laterality Date   COLONOSCOPY WITH PROPOFOL N/A 07/22/2021   Procedure: COLONOSCOPY WITH PROPOFOL;  Surgeon: Lin Landsman, MD;  Location: [facility] ENDOSCOPY;  Service: Gastroenterology;  Laterality: N/A;   PILONIDAL CYST EXCISION     WISDOM TOOTH EXTRACTION      Social History:  reports that she has never smoked. She has never used smokeless tobacco. She reports current alcohol use. She reports that she does not use drugs.  Family History:  Family History  Problem Relation Age of Onset   Heart disease Mother        2005 stent placement   Hypertension Mother    Diabetes Father    Hypertension Father    Obesity Father    Stroke Maternal Aunt    Cancer Maternal Grandfather        prostate   Heart disease Maternal Grandfather    Heart disease Maternal Uncle    Heart disease Maternal Grandmother      Prior to Admission medications   Medication Sig Start Date End Date Taking? Authorizing Provider  Continuous Blood Gluc Sensor (FREESTYLE  LIBRE 3 SENSOR) MISC Apply 1 each topically every 14 (fourteen) days. Place 1 sensor on the skin every 14 days. Use to check glucose continuously 03/24/21   Leone Haven, MD  fluticasone (CUTIVATE) 0.05 % cream APPLY TO AFFECTED AREA UP TO TWICE A DAY AS NEEDED FOR RASH/ITCHING 03/28/19   Leone Haven, MD  glipiZIDE (GLUCOTROL) 5 MG tablet Take 5 mg by mouth 2 (two) times daily. 10/24/21   [provider]  glucose monitoring kit (FREESTYLE) monitoring kit Use as directed to check sugars bid 10/31/13   Jackolyn Confer, MD  hydrochlorothiazide (MICROZIDE) 12.5 MG capsule TAKE 1  CAPSULE BY MOUTH EVERY DAY 09/26/21   Leone Haven, MD  lisdexamfetamine (VYVANSE) 10 MG capsule Take 1 capsule (10 mg total) by mouth daily. 03/31/21   Briscoe Deutscher, DO  metFORMIN (GLUCOPHAGE-XR) 500 MG 24 hr tablet TAKE 2 TABLETS BY MOUTH TWICE A DAY 09/04/21   Dutch Quint B, FNP  norethindrone (CAMILA) 0.35 MG tablet Take 1 tablet (0.35 mg total) by mouth daily. 04/10/16   Leone Haven, MD  rosuvastatin (CRESTOR) 20 MG tablet TAKE 1 TABLET BY MOUTH EVERY DAY 01/12/21   Leone Haven, MD  Semaglutide,0.25 or 0.'5MG'$ /DOS, 2 MG/3ML SOPN Inject 0.25 mg into the skin once a week for 28 days, THEN 0.5 mg once a week. 12/21/21 04/18/22  Leone Haven, MD  Vitamin D, Ergocalciferol, (DRISDOL) 1.25 MG (50000 UNIT) CAPS capsule Take 1 capsule (50,000 Units total) by mouth every 7 (seven) days. 12/14/21   Mellody Dance, DO    Physical Exam: Vitals:   03/24/22 1334  BP: (!) 144/81  Pulse: 84  Resp: 18  Temp: 98.2 F (36.8 C)  TempSrc: Oral  SpO2: 96%   General: Not in acute distress HEENT:       Eyes: PERRL, EOMI, no scleral icterus.       ENT: No discharge from the ears and nose, no pharynx injection, no tonsillar enlargement.        Neck: No JVD, no bruit, no mass felt. Heme: No neck lymph node enlargement. Cardiac: S1/S2, RRR, No murmurs, No gallops or rubs. Respiratory: No rales, wheezing, rhonchi or rubs. GI: Soft, nondistended, nontender, no rebound pain, no organomegaly, BS present. GU: No hematuria Ext: No pitting leg edema bilaterally. 1+DP/PT pulse bilaterally.  Pt has a small healing puncture wound in right ring finger, with surrounding erythema, swelling. Has tenderness with passive extension.      Musculoskeletal: No joint deformities, No joint redness or warmth, no limitation of ROM in spin. Skin: No rashes.  Neuro: Alert, oriented X3, cranial nerves II-XII grossly intact, moves all extremities normally.  Psych: Patient is not psychotic, no suicidal or  hemocidal ideation.  Labs on Admission: I have personally reviewed following labs and imaging studies  CBC: Recent Labs  Lab 03/24/22 1415  WBC 10.3  NEUTROABS 7.0  HGB 14.1  HCT 42.3  MCV 95.7  PLT 570   Basic Metabolic Panel: Recent Labs  Lab 03/24/22 1415  NA 133*  K 4.2  CL 100  CO2 25  GLUCOSE 340*  BUN 13  CREATININE 0.60  CALCIUM 8.3*   GFR: CrCl cannot be calculated (Unknown ideal weight.). Liver Function Tests: No results for input(s): "AST", "ALT", "ALKPHOS", "BILITOT", "PROT", "ALBUMIN" in the last 168 hours. No results for input(s): "LIPASE", "AMYLASE" in the last 168 hours. No results for input(s): "AMMONIA" in the last 168 hours. Coagulation Profile: No results for input(s): "  INR", "PROTIME" in the last 168 hours. Cardiac Enzymes: No results for input(s): "CKTOTAL", "CKMB", "CKMBINDEX", "TROPONINI" in the last 168 hours. BNP (last 3 results) No results for input(s): "PROBNP" in the last 8760 hours. HbA1C: No results for input(s): "HGBA1C" in the last 72 hours. CBG: No results for input(s): "GLUCAP" in the last 168 hours. Lipid Profile: No results for input(s): "CHOL", "HDL", "LDLCALC", "TRIG", "CHOLHDL", "LDLDIRECT" in the last 72 hours. Thyroid Function Tests: No results for input(s): "TSH", "T4TOTAL", "FREET4", "T3FREE", "THYROIDAB" in the last 72 hours. Anemia Panel: No results for input(s): "VITAMINB12", "FOLATE", "FERRITIN", "TIBC", "IRON", "RETICCTPCT" in the last 72 hours. Urine analysis: No results found for: "COLORURINE", "APPEARANCEUR", "LABSPEC", "PHURINE", "GLUCOSEU", "HGBUR", "BILIRUBINUR", "KETONESUR", "PROTEINUR", "UROBILINOGEN", "NITRITE", "LEUKOCYTESUR" Sepsis Labs: '@LABRCNTIP'$ (procalcitonin:4,lacticidven:4) )No results found for this or any previous visit (from the past 240 hour(s)).   Radiological Exams on Admission: DG Hand Complete Right  Result Date: 03/24/2022 CLINICAL DATA:  Infection.  Foreign body. EXAM: RIGHT HAND -  COMPLETE 3+ VIEW COMPARISON:  None Available. FINDINGS: There is no evidence of fracture or dislocation. There is no evidence of arthropathy or other focal bone abnormality. Soft tissues are unremarkable. IMPRESSION: Negative. Electronically Signed   By: Nolon Nations M.D.   On: 03/24/2022 14:59      Assessment/Plan Principal Problem:   Dog bite of right hand Active Problems:   Cellulitis of right hand   Diabetes mellitus type 2 in obese ([place])   Hypertension associated with diabetes ([place])   HLD (hyperlipidemia)   Obesity, Class III, BMI 40-49.9 (morbid obesity) ([place])   Assessment and Plan:  Dog bite of right hand and cellulitis of right hand in right finger: No fever or leukocytosis. Not septic. Message sent to Dr. Sharlet Salina of ortho by EDP for consult.  - Placed on MedSurg bed for observation - Empiric antimicrobial treatment with Unasyn (patient received 1 dose of vancomycin in ED) - PRN Zofran for nausea, tylenol and Percocet for pain - Blood cultures x 2  - ESR and CRP  Diabetes mellitus type 2 in obese [facility]): Recent A1c 12.4, poorly controlled.  Patient is taking glipizide, metformin at home -SSI  Hypertension associated with diabetes ([place]): -IV hydralazine as needed -HCTZ  HLD (hyperlipidemia):  -Crestor  Morbid obesity: BMI 44.93, body weight 122.5 kg. -Healthy diet and exercise -Encourage losing weight     DVT ppx: SCD  Code Status: Full code  Family Communication: not done, no family member is at bed side.      Disposition Plan:  Anticipate discharge back to previous environment  Consults called:   Message sent to Dr. Sharlet Salina of Ortho for consultation.  Admission status and Level of care: Med-Surg:   for obs     Dispo: The patient is from: Home              Anticipated d/c is to: Home              Anticipated d/c date is: 1 day              Patient currently is not medically stable to d/c.    Severity of Illness:  The appropriate patient  status for this patient is OBSERVATION. Observation status is judged to be reasonable and necessary in order to provide the required intensity of service to ensure the patient's safety. The patient's presenting symptoms, physical exam findings, and initial radiographic and laboratory data in the context of their medical condition is felt to place them at  decreased risk for further clinical deterioration. Furthermore, it is anticipated that the patient will be medically stable for discharge from the hospital within 2 midnights of admission.        Date of Service 03/24/2022    [facility]   If 7PM-7AM, please contact night-coverage [email or web address] 03/24/2022, 4:59 PM

## 2022-03-24 NOTE — Consult Note (Signed)
PHARMACY -  BRIEF ANTIBIOTIC NOTE   Pharmacy has received consult(s) for vancomycin from an ED provider.  The patient's profile has been reviewed for ht/wt/allergies/indication/available labs.    One time order(s) placed for vancomycin 2500 mg IV x1 based on most recent weight >100 kg   Further antibiotics/pharmacy consults should be ordered by admitting physician if indicated.                       Thank you, Darrick Penna 03/24/2022  3:55 PM

## 2022-03-24 NOTE — ED Provider Notes (Cosign Needed Addendum)
[facility] Provider Note    Event Date/Time   First MD Initiated Contact with Patient 03/24/22 1345     (approximate)   History   Chief Complaint Hand Pain   HPI Veronica Norton is a 47 y.o. female, history of type 2 diabetes, hypertension, severe obesity, hirsutism, hyperlipidemia, presents to the emergency department for evaluation of right hand swelling.  She states that she was bit by her dog approximately 2 days ago.  Since then she has had increased pain and swelling in the right ring finger, where she sustained a small puncture wound along the palmar surface of the finger.  Dog is up-to-date on all vaccinations and under her care.  She states that the dog has not been exhibiting any symptoms of rabies.  Denies fever/chills, chest pain, shortness of breath, cold sensation, paresthesias, urinary symptoms, nausea/vomiting, abdominal pain, or dizziness/lightheadedness.  History Limitations: No limitations.        Physical Exam  Triage Vital Signs: ED Triage Vitals  Enc Vitals Group     BP 03/24/22 1334 (!) 144/81     Pulse Rate 03/24/22 1334 84     Resp 03/24/22 1334 18     Temp 03/24/22 1334 98.2 F (36.8 C)     Temp Source 03/24/22 1334 Oral     SpO2 03/24/22 1334 96 %     Weight --      Height --      Head Circumference --      Peak Flow --      Pain Score 03/24/22 1327 4     Pain Loc --      Pain Edu? --      Excl. in [place]? --     Most recent vital signs: Vitals:   03/24/22 1334  BP: (!) 144/81  Pulse: 84  Resp: 18  Temp: 98.2 F (36.8 C)  SpO2: 96%    General: Awake, NAD.  Skin: Warm, dry. No rashes or lesions.  Eyes: PERRL. Conjunctivae normal.  CV: Good peripheral perfusion.  Resp: Normal effort.  Abd: Soft, non-tender. No distention.  Neuro: At baseline. No gross neurological deficits.  Musculoskeletal: Normal ROM of all extremities.  Focused Exam: Moderate swelling and erythema across the fourth digit.  Flexed  neutral position.  Slight pain with passive extension with some tenderness to percussion on the tendon sheath.  There does appear to be a healing puncture wound along the base of the affected finger.  No active bleeding or discharge.    Media Information     Media Information    Physical Exam    ED Results / Procedures / Treatments  Labs (all labs ordered are listed, but only abnormal results are displayed) Labs Reviewed  BASIC METABOLIC PANEL - Abnormal; Notable for the following components:      Result Value   Sodium 133 (*)    Glucose, Bld 340 (*)    Calcium 8.3 (*)    All other components within normal limits  CULTURE, BLOOD (ROUTINE X 2)  CULTURE, BLOOD (ROUTINE X 2)  CBC WITH DIFFERENTIAL/PLATELET  SEDIMENTATION RATE  C-REACTIVE PROTEIN  PREGNANCY, URINE  PROTIME-INR  APTT  HIV ANTIBODY (ROUTINE TESTING W REFLEX)     EKG N/A.    RADIOLOGY  ED Provider Interpretation: I personally viewed and interpreted this x-ray, no foreign bodies or underlying fractures.  DG Hand Complete Right  Result Date: 03/24/2022 CLINICAL DATA:  Infection.  Foreign body. EXAM: RIGHT HAND -  COMPLETE 3+ VIEW COMPARISON:  None Available. FINDINGS: There is no evidence of fracture or dislocation. There is no evidence of arthropathy or other focal bone abnormality. Soft tissues are unremarkable. IMPRESSION: Negative. Electronically Signed   By: Nolon Nations M.D.   On: 03/24/2022 14:59    PROCEDURES:  Critical Care performed: N/A.  Procedures    MEDICATIONS ORDERED IN ED: Medications  vancomycin (VANCOCIN) IVPB 1000 mg/200 mL premix (has no administration in time range)    Followed by  vancomycin (VANCOREADY) IVPB 1500 mg/300 mL (has no administration in time range)  insulin aspart (novoLOG) injection 0-9 Units (has no administration in time range)  insulin aspart (novoLOG) injection 0-5 Units (has no administration in time range)  oxyCODONE-acetaminophen (PERCOCET/ROXICET)  5-325 MG per tablet 1 tablet (has no administration in time range)  acetaminophen (TYLENOL) tablet 650 mg (has no administration in time range)  ondansetron (ZOFRAN) injection 4 mg (has no administration in time range)  hydrALAZINE (APRESOLINE) injection 5 mg (has no administration in time range)  heparin injection 5,000 Units (has no administration in time range)  0.9 %  sodium chloride infusion (has no administration in time range)  ketorolac (TORADOL) 15 MG/ML injection 15 mg (15 mg Intravenous Given 03/24/22 1418)  sodium chloride 0.9 % bolus 1,000 mL (1,000 mLs Intravenous New Bag/Given 03/24/22 1508)  Ampicillin-Sulbactam (UNASYN) 3 g in sodium chloride 0.9 % 100 mL IVPB (3 g Intravenous New Bag/Given 03/24/22 1558)  Tdap (BOOSTRIX) injection 0.5 mL (0.5 mLs Intramuscular Given 03/24/22 1553)     IMPRESSION / MDM / ASSESSMENT AND PLAN / ED COURSE  I reviewed the triage vital signs and the nursing notes.                              Differential diagnosis includes, but is not limited to, cellulitis, abscess, osteomyelitis, flexor tenosynovitis, dog bite, tetanus, foreign body.  ED Course Patient appears well, vitals within normal limits.  NAD.  CBC shows no leukocytosis or anemia.  BMP notable for elevated glucose at 340.  She is a type II diabetic and currently takes metformin.  Will initiate IV fluids.  No other electrolyte abnormalities or AKI.  Sedimentation rate unremarkable at 16.  Assessment/Plan Patient presents with right ring finger erythema, warmth, and swelling in the setting of recent dog bite along the palmar surface of her finger approximately 2 days prior.  X-ray shows no evidence of foreign bodies or underlying fractures.  There does not appear to be any open wounds at this time, though her physical exam findings are concerning for infection.  It does not appear to be an obvious flexor tenosynovitis requiring immediate surgery at this time, however will initiate  antibiotics and cover for these common pathogens.  Will initiate Unasyn and vancomycin per pharmacy protocol.  Spoke with Dr. Sharlet Salina, who is currently in the operating room and unable to examine the patient at this time.  Spoke with ED attending, Dr Kerman Passey, who agreed admission here with antibiotics and reevaluation to be most appropriate at this time.  Dr. Sharlet Salina has graciously agreed to reexamine the patient when he returns from the operating room.  Spoke with the on-call hospitalist, who agreed to admission.   Patient's presentation is most consistent with acute complicated illness / injury requiring diagnostic workup.       FINAL CLINICAL IMPRESSION(S) / ED DIAGNOSES   Final diagnoses:  Finger infection  Dog bite, initial encounter  Rx / DC Orders   ED Discharge Orders     None        Note:  This document was prepared using Dragon voice recognition software and may include unintentional dictation errors.   Teodoro Spray, [place] 03/24/22 [place], [place], [place] 03/24/22 1636    Lavonia Drafts, MD 03/27/22 [phone]

## 2022-03-24 NOTE — ED Triage Notes (Signed)
Pt comes with c/o right hand swelling.

## 2022-03-24 NOTE — Consult Note (Addendum)
Pharmacy Antibiotic Note  Veronica Norton is a 47 y.o. female admitted on 03/24/2022 with cellulitis.  Pharmacy has been consulted for unaysn dosing.  Plan: Give Unasyn 3g IV every 6 hours Estimated Adj CrCl of 117 ml/min based on most recent weight of 122.5kg, ht of 65 inches and scr rounded to 0.8 mg/dL Continue to monitor and dose adjust antibiotics according to renal function and indication    Temp (24hrs), Avg:98.2 F (36.8 C), Min:98.2 F (36.8 C), Max:98.2 F (36.8 C)  Recent Labs  Lab 03/24/22 1415  WBC 10.3  CREATININE 0.60    CrCl cannot be calculated (Unknown ideal weight.).    No Known Allergies  Antimicrobials this admission: 1/12 Unasyn >>  1/12 Vancomycin x1 in ED  Microbiology results: 1/12 BCx: sent   Thank you for allowing pharmacy to be a part of this patient's care.  [place] Pharmacist 03/24/2022 7:02 PM

## 2022-03-25 DIAGNOSIS — I152 Hypertension secondary to endocrine disorders: Secondary | ICD-10-CM | POA: Diagnosis present

## 2022-03-25 DIAGNOSIS — Z91148 Patient's other noncompliance with medication regimen for other reason: Secondary | ICD-10-CM | POA: Diagnosis not present

## 2022-03-25 DIAGNOSIS — L680 Hirsutism: Secondary | ICD-10-CM | POA: Diagnosis present

## 2022-03-25 DIAGNOSIS — E1169 Type 2 diabetes mellitus with other specified complication: Secondary | ICD-10-CM | POA: Diagnosis not present

## 2022-03-25 DIAGNOSIS — L03011 Cellulitis of right finger: Secondary | ICD-10-CM | POA: Diagnosis present

## 2022-03-25 DIAGNOSIS — E785 Hyperlipidemia, unspecified: Secondary | ICD-10-CM | POA: Diagnosis present

## 2022-03-25 DIAGNOSIS — Z8249 Family history of ischemic heart disease and other diseases of the circulatory system: Secondary | ICD-10-CM | POA: Diagnosis not present

## 2022-03-25 DIAGNOSIS — Z23 Encounter for immunization: Secondary | ICD-10-CM | POA: Diagnosis not present

## 2022-03-25 DIAGNOSIS — L03113 Cellulitis of right upper limb: Secondary | ICD-10-CM | POA: Diagnosis not present

## 2022-03-25 DIAGNOSIS — S61451A Open bite of right hand, initial encounter: Secondary | ICD-10-CM | POA: Diagnosis not present

## 2022-03-25 DIAGNOSIS — E1165 Type 2 diabetes mellitus with hyperglycemia: Secondary | ICD-10-CM | POA: Diagnosis present

## 2022-03-25 DIAGNOSIS — Z833 Family history of diabetes mellitus: Secondary | ICD-10-CM | POA: Diagnosis not present

## 2022-03-25 DIAGNOSIS — Y929 Unspecified place or not applicable: Secondary | ICD-10-CM | POA: Diagnosis not present

## 2022-03-25 DIAGNOSIS — L089 Local infection of the skin and subcutaneous tissue, unspecified: Secondary | ICD-10-CM | POA: Diagnosis not present

## 2022-03-25 DIAGNOSIS — Z6841 Body Mass Index (BMI) 40.0 and over, adult: Secondary | ICD-10-CM | POA: Diagnosis not present

## 2022-03-25 DIAGNOSIS — W540XXA Bitten by dog, initial encounter: Secondary | ICD-10-CM | POA: Diagnosis not present

## 2022-03-25 DIAGNOSIS — Z7984 Long term (current) use of oral hypoglycemic drugs: Secondary | ICD-10-CM | POA: Diagnosis not present

## 2022-03-25 DIAGNOSIS — E871 Hypo-osmolality and hyponatremia: Secondary | ICD-10-CM | POA: Diagnosis present

## 2022-03-25 DIAGNOSIS — Z823 Family history of stroke: Secondary | ICD-10-CM | POA: Diagnosis not present

## 2022-03-25 DIAGNOSIS — Z79899 Other long term (current) drug therapy: Secondary | ICD-10-CM | POA: Diagnosis not present

## 2022-03-25 DIAGNOSIS — S61254A Open bite of right ring finger without damage to nail, initial encounter: Secondary | ICD-10-CM | POA: Diagnosis present

## 2022-03-25 LAB — BASIC METABOLIC PANEL
Anion gap: 8 (ref 5–15)
BUN: 10 mg/dL (ref 6–20)
CO2: 23 mmol/L (ref 22–32)
Calcium: 7.9 mg/dL — ABNORMAL LOW (ref 8.9–10.3)
Chloride: 104 mmol/L (ref 98–111)
Creatinine, Ser: 0.61 mg/dL (ref 0.44–1.00)
GFR, Estimated: >60 mL/min (ref >60)
Glucose, Bld: 174 mg/dL — ABNORMAL HIGH (ref 70–99)
Potassium: 3.6 mmol/L (ref 3.5–5.1)
Sodium: 135 mmol/L (ref 135–145)

## 2022-03-25 LAB — CBC
HCT: 37.9 % (ref 36.0–46.0)
Hemoglobin: 12.7 g/dL (ref 12.0–15.0)
MCH: 31.9 pg (ref 26.0–34.0)
MCHC: 33.5 g/dL (ref 30.0–36.0)
MCV: 95.2 fL (ref 80.0–100.0)
Platelets: 322 10*3/uL (ref 150–400)
RBC: 3.98 MIL/uL (ref 3.87–5.11)
RDW: 12.1 % (ref 11.5–15.5)
WBC: 7.7 10*3/uL (ref 4.0–10.5)
nRBC: 0 % (ref 0.0–0.2)

## 2022-03-25 LAB — HEMOGLOBIN A1C
Hgb A1c MFr Bld: 12.4 % — ABNORMAL HIGH (ref 4.8–5.6)
Mean Plasma Glucose: 309.18 mg/dL

## 2022-03-25 LAB — GLUCOSE, CAPILLARY
Glucose-Capillary: 205 mg/dL — ABNORMAL HIGH (ref 70–99)
Glucose-Capillary: 332 mg/dL — ABNORMAL HIGH (ref 70–99)
Glucose-Capillary: 249 mg/dL — ABNORMAL HIGH (ref 70–99)
Glucose-Capillary: 213 mg/dL — ABNORMAL HIGH (ref 70–99)

## 2022-03-25 LAB — C-REACTIVE PROTEIN: CRP: 0.8 mg/dL (ref <1.0)

## 2022-03-25 MED ORDER — OXYCODONE-ACETAMINOPHEN 5-325 MG PO TABS: 1.0000 | ORAL_TABLET | ORAL | Status: DC | PRN

## 2022-03-25 MED ORDER — INSULIN GLARGINE-YFGN 100 UNIT/ML ~~LOC~~ SOLN
15.0000 [IU] | Freq: Every day | SUBCUTANEOUS | Status: DC
  Administered 2022-03-25 – 2022-03-26 (×2): 15 [IU] via SUBCUTANEOUS
  Filled 2022-03-25 (×2): qty 0.15

## 2022-03-25 MED ORDER — INSULIN ASPART 100 UNIT/ML IJ SOLN
0.0000 [IU] | Freq: Every day | INTRAMUSCULAR | Status: DC
  Administered 2022-03-25: 3 [IU] via SUBCUTANEOUS
  Filled 2022-03-25: qty 1

## 2022-03-25 MED ORDER — NAPROXEN 500 MG PO TABS
500.0000 mg | ORAL_TABLET | Freq: Two times a day (BID) | ORAL | Status: DC | PRN
  Administered 2022-03-25 – 2022-03-26 (×3): 500 mg via ORAL
  Filled 2022-03-25 (×4): qty 1

## 2022-03-25 MED ORDER — INSULIN ASPART 100 UNIT/ML IJ SOLN
0.0000 [IU] | Freq: Three times a day (TID) | INTRAMUSCULAR | Status: DC
  Administered 2022-03-25: 11 [IU] via SUBCUTANEOUS
  Administered 2022-03-25: 5 [IU] via SUBCUTANEOUS
  Administered 2022-03-26: 8 [IU] via SUBCUTANEOUS
  Administered 2022-03-26: 5 [IU] via SUBCUTANEOUS
  Administered 2022-03-27: 3 [IU] via SUBCUTANEOUS
  Filled 2022-03-25 (×5): qty 1

## 2022-03-25 NOTE — Progress Notes (Signed)
PROGRESS NOTE  Veronica Norton EXH:[id number] DOB: 1975-04-11   PCP: Leone Haven, MD  Patient is from: Home.  Lives with family.  DOA: 03/24/2022 LOS: 0  Chief complaints Chief Complaint  Patient presents with   Hand Pain     Brief Narrative / Interim history: 47 year old F with PMH of HTN, DM-2, HLD and morbid obesity presenting with worsening right hand and finger swelling and pain after bite by family dog 2 days prior.  Vital stable.  WBC 10.3.  X-ray of right hand without fluid collection or osseous abnormalities.  Had a Tdap in ED.  Started on IV Unasyn.  Orthopedic surgery consulted, and recommended continuing IV antibiotics.   Slowly improving. Subjective: Seen and examined earlier this morning.  Reports improvement in her pain and swelling.  Advil helped with the pain.  Objective: Vitals:   03/24/22 1334 03/24/22 1851 03/25/22 0758  BP: (!) 144/81 135/75 135/70  Pulse: 84 87 80  Resp: '18 16 18  '$ Temp: 98.2 F (36.8 C) 98.2 F (36.8 C) 98.1 F (36.7 C)  TempSrc: Oral    SpO2: 96% 99% 99%    Examination:  GENERAL: No apparent distress.  Nontoxic. HEENT: MMM.  Vision and hearing grossly intact.  NECK: Supple.  No apparent JVD.  RESP:  No IWOB.  Fair aeration bilaterally. CVS:  RRR. Heart sounds normal.  ABD/GI/GU: BS+. Abd soft, NTND.  MSK/EXT: Mild swelling along the dorsal aspect of right hand mainly in right ring finger at MCP joint.  Healing puncture wound over PIP joint.  Some pain with flexion and extension of right ring finger.  No numbness. SKIN: As above NEURO: Awake, alert and oriented appropriately.  No apparent focal neuro deficit. PSYCH: Calm. Normal affect.   Procedures:  None  Microbiology summarized: Blood cultures NGTD  Assessment and plan: Principal Problem:   Dog bite of right hand with infection Active Problems:   Dog bite of right hand   Cellulitis of right hand   Diabetes mellitus type 2 in obese ([place])   Hypertension  associated with diabetes ([place])   HLD (hyperlipidemia)   Obesity, Class III, BMI 40-49.9 (morbid obesity) ([place])   Dog bite of right hand with infection/cellulitis.  X-ray without osseous finding or abscess.  Some improvement in swelling and pain.  No constitutional symptoms.  Blood cultures NGTD.  Had Tdap in ED. -Appreciate input by orthopedic surgery-continue IV antibiotics, IV Unasyn in this case -Pain control-Tylenol, Aleve and Percocet based on pain severity -Right arm elevation  Uncontrolled NIDDM-2 with hyperglycemia and HLD: A1c 12.4% in 11/2021.  On Ozempic, glipizide and metformin at home. Recent Labs  Lab 03/24/22 1749 03/24/22 2017 03/25/22 0754 03/25/22 1252  GLUCAP 288* 280* 205* 332*  -Continue SSI-moderate. -Continue NovoLog 5 units 3 times daily with meals -Add Semglee 15 units daily -Check hemoglobin A1c -Further adjustment as appropriate. -Continue home Crestor.  Essential hypertension: Normotensive. -Continue home HCTZ  History of obesity: -Check BMI. -On Ozempic. There is no height or weight on file to calculate BMI.   DVT prophylaxis:  Place and maintain sequential compression device Start: 03/24/22 1643  Code Status: Full code Family Communication: Updated patient's father, mother and sister at bedside Level of care: Med-Surg Status is: Inpatient Remains inpatient appropriate because: Dog bite of right hand with infection   Final disposition: Likely home once medically cleared Consultants:  Orthopedic surgery  55 minutes with more than 50% spent in reviewing records, counseling patient/family and coordinating care.  Sch Meds:  Scheduled Meds:  hydrochlorothiazide  12.5 mg Oral Daily   insulin aspart  0-15 Units Subcutaneous TID WC   insulin aspart  0-5 Units Subcutaneous QHS   rosuvastatin  20 mg Oral Daily   Continuous Infusions:  ampicillin-sulbactam (UNASYN) IV Stopped (03/25/22 1035)   PRN Meds:.acetaminophen, hydrALAZINE,  ondansetron (ZOFRAN) IV, oxyCODONE-acetaminophen  Antimicrobials: Anti-infectives (From admission, onward)    Start     Dose/Rate Route Frequency Ordered Stop   03/24/22 2200  Ampicillin-Sulbactam (UNASYN) 3 g in sodium chloride 0.9 % 100 mL IVPB        3 g 200 mL/hr over 30 Minutes Intravenous Every 6 hours 03/24/22 1901     03/24/22 1700  vancomycin (VANCOREADY) IVPB 1500 mg/300 mL       See Hyperspace for full Linked Orders Report.   1,500 mg 150 mL/hr over 120 Minutes Intravenous  Once 03/24/22 1557 03/24/22 2259   03/24/22 1600  vancomycin (VANCOCIN) IVPB 1000 mg/200 mL premix       See Hyperspace for full Linked Orders Report.   1,000 mg 200 mL/hr over 60 Minutes Intravenous  Once 03/24/22 1557 03/24/22 1849   03/24/22 1515  Ampicillin-Sulbactam (UNASYN) 3 g in sodium chloride 0.9 % 100 mL IVPB        3 g 200 mL/hr over 30 Minutes Intravenous  Once 03/24/22 1514 03/24/22 1713        I have personally reviewed the following labs and images: CBC: Recent Labs  Lab 03/24/22 1415 03/25/22 0515  WBC 10.3 7.7  NEUTROABS 7.0  --   HGB 14.1 12.7  HCT 42.3 37.9  MCV 95.7 95.2  PLT 350 322   BMP &GFR Recent Labs  Lab 03/24/22 1415 03/25/22 0515  NA 133* 135  K 4.2 3.6  CL 100 104  CO2 25 23  GLUCOSE 340* 174*  BUN 13 10  CREATININE 0.60 0.61  CALCIUM 8.3* 7.9*   CrCl cannot be calculated (Unknown ideal weight.). Liver & Pancreas: No results for input(s): "AST", "ALT", "ALKPHOS", "BILITOT", "PROT", "ALBUMIN" in the last 168 hours. No results for input(s): "LIPASE", "AMYLASE" in the last 168 hours. No results for input(s): "AMMONIA" in the last 168 hours. Diabetic: No results for input(s): "HGBA1C" in the last 72 hours. Recent Labs  Lab 03/24/22 1749 03/24/22 2017 03/25/22 0754 03/25/22 1252  GLUCAP 288* 280* 205* 332*   Cardiac Enzymes: No results for input(s): "CKTOTAL", "CKMB", "CKMBINDEX", "TROPONINI" in the last 168 hours. No results for input(s):  "PROBNP" in the last 8760 hours. Coagulation Profile: Recent Labs  Lab 03/24/22 1919  INR 1.0   Thyroid Function Tests: No results for input(s): "TSH", "T4TOTAL", "FREET4", "T3FREE", "THYROIDAB" in the last 72 hours. Lipid Profile: No results for input(s): "CHOL", "HDL", "LDLCALC", "TRIG", "CHOLHDL", "LDLDIRECT" in the last 72 hours. Anemia Panel: No results for input(s): "VITAMINB12", "FOLATE", "FERRITIN", "TIBC", "IRON", "RETICCTPCT" in the last 72 hours. Urine analysis: No results found for: "COLORURINE", "APPEARANCEUR", "LABSPEC", "PHURINE", "GLUCOSEU", "HGBUR", "BILIRUBINUR", "KETONESUR", "PROTEINUR", "UROBILINOGEN", "NITRITE", "LEUKOCYTESUR" Sepsis Labs: Invalid input(s): "PROCALCITONIN", "LACTICIDVEN"  Microbiology: Recent Results (from the past 240 hour(s))  Culture, blood (Routine X 2) w Reflex to ID Panel     Status: None (Preliminary result)   Collection Time: 03/24/22  7:19 PM   Specimen: BLOOD  Result Value Ref Range Status   Specimen Description BLOOD BLOOD LEFT HAND  Final   Special Requests   Final    BOTTLES DRAWN AEROBIC AND ANAEROBIC Blood Culture adequate volume  Culture   Final    NO GROWTH < 12 HOURS Performed at [facility], [address] [place], [place] [address]    Report Status PENDING  Incomplete  Culture, blood (Routine X 2) w Reflex to ID Panel     Status: None (Preliminary result)   Collection Time: 03/24/22  7:29 PM   Specimen: BLOOD  Result Value Ref Range Status   Specimen Description BLOOD BLOOD RIGHT HAND  Final   Special Requests   Final    BOTTLES DRAWN AEROBIC AND ANAEROBIC Blood Culture adequate volume   Culture   Final    NO GROWTH < 12 HOURS Performed at [facility], [address] [place],  [address]    Report Status PENDING  Incomplete    Radiology Studies: DG Hand Complete Right  Result Date: 03/24/2022 CLINICAL DATA:  Infection.  Foreign body. EXAM: RIGHT HAND - COMPLETE 3+ VIEW COMPARISON:   None Available. FINDINGS: There is no evidence of fracture or dislocation. There is no evidence of arthropathy or other focal bone abnormality. Soft tissues are unremarkable. IMPRESSION: Negative. Electronically Signed   By: Nolon Nations M.D.   On: 03/24/2022 14:59      Tahani Potier T. [place]  If 7PM-7AM, please contact night-coverage [email or web address] 03/25/2022, 1:58 PM

## 2022-03-25 NOTE — Consult Note (Signed)
ORTHOPAEDIC CONSULTATION  REQUESTING PHYSICIAN: Mercy Riding, MD  Chief Complaint: Right hand/ring finger dog bite  HPI: Veronica Norton is a 47 y.o. female who complains of worsening pain, erythema, swelling of the right ring finger into the hand after a dog bite sustained on 1/10.  Patient was seen in the ER and IV vancomycin was given.  She was then admitted and started on IV Unasyn.  Orthopedics was consulted regarding further management.    Past Medical History:  Diagnosis Date   Diabetes mellitus    H/O pilonidal cyst    Hypertension    Normal cardiac stress test 2009   Pre-diabetes    Past Surgical History:  Procedure Laterality Date   COLONOSCOPY WITH PROPOFOL N/A 07/22/2021   Procedure: COLONOSCOPY WITH PROPOFOL;  Surgeon: Lin Landsman, MD;  Location: [facility] ENDOSCOPY;  Service: Gastroenterology;  Laterality: N/A;   PILONIDAL CYST EXCISION     WISDOM TOOTH EXTRACTION     Social History   Socioeconomic History   Marital status: Single    Spouse name: Not on file   Number of children: 0   Years of education: Not on file   Highest education level: Not on file  Occupational History   Occupation: Substance abuse educator - [employer]   Tobacco Use   Smoking status: Never   Smokeless tobacco: Never  Vaping Use   Vaping Use: Never used  Substance and Sexual Activity   Alcohol use: Yes    Comment: Occasional,non last 24hrs   Drug use: No   Sexual activity: Not on file  Other Topics Concern   Not on file  Social History Narrative   Lives in [place]. Works counseling substance abusers.      Regular Exercise -  NO   Daily Caffeine Use:  NO            Social Determinants of Health   Financial Resource Strain: Low Risk  (11/24/2020)   Overall Financial Resource Strain (CARDIA)    Difficulty of Paying Living Expenses: Not hard at all  Food Insecurity: No Food Insecurity (03/24/2022)   Hunger Vital Sign    Worried About Running Out of Food in the Last  Year: Never true    Ran Out of Food in the Last Year: Never true  Transportation Needs: No Transportation Needs (03/24/2022)   PRAPARE - Hydrologist (Medical): No    Lack of Transportation (Non-Medical): No  Physical Activity: Insufficiently Active (10/16/2019)   Exercise Vital Sign    Days of Exercise per Week: 4 days    Minutes of Exercise per Session: 30 min  Stress: Not on file  Social Connections: Not on file   Family History  Problem Relation Age of Onset   Heart disease Mother        2005 stent placement   Hypertension Mother    Diabetes Father    Hypertension Father    Obesity Father    Stroke Maternal Aunt    Cancer Maternal Grandfather        prostate   Heart disease Maternal Grandfather    Heart disease Maternal Uncle    Heart disease Maternal Grandmother    No Known Allergies Prior to Admission medications   Medication Sig Start Date End Date Taking? Authorizing Provider  Continuous Blood Gluc Sensor (FREESTYLE LIBRE 3 SENSOR) MISC Apply 1 each topically every 14 (fourteen) days. Place 1 sensor on the skin every 14 days. Use to check glucose  continuously 03/24/21  Yes Leone Haven, MD  glipiZIDE (GLUCOTROL) 5 MG tablet Take 5 mg by mouth 2 (two) times daily.   Yes [provider]  glucose monitoring kit (FREESTYLE) monitoring kit Use as directed to check sugars bid 10/31/13  Yes Jackolyn Confer, MD  hydrochlorothiazide (MICROZIDE) 12.5 MG capsule TAKE 1 CAPSULE BY MOUTH EVERY DAY 09/26/21  Yes Leone Haven, MD  metFORMIN (GLUCOPHAGE-XR) 500 MG 24 hr tablet TAKE 2 TABLETS BY MOUTH TWICE A DAY 09/04/21  Yes Dutch Quint B, FNP  rosuvastatin (CRESTOR) 20 MG tablet TAKE 1 TABLET BY MOUTH EVERY DAY 01/12/21  Yes Leone Haven, MD  norethindrone (CAMILA) 0.35 MG tablet Take 1 tablet (0.35 mg total) by mouth daily. Patient not taking: Reported on 03/24/2022 04/10/16   Leone Haven, MD  Semaglutide,0.25 or 0.'5MG'$ /DOS, 2  MG/3ML SOPN Inject 0.25 mg into the skin once a week for 28 days, THEN 0.5 mg once a week. 12/21/21 04/18/22  Leone Haven, MD  Vitamin D, Ergocalciferol, (DRISDOL) 1.25 MG (50000 UNIT) CAPS capsule Take 1 capsule (50,000 Units total) by mouth every 7 (seven) days. Patient not taking: Reported on 03/24/2022 12/14/21   Mellody Dance, DO   DG Hand Complete Right  Result Date: 03/24/2022 CLINICAL DATA:  Infection.  Foreign body. EXAM: RIGHT HAND - COMPLETE 3+ VIEW COMPARISON:  None Available. FINDINGS: There is no evidence of fracture or dislocation. There is no evidence of arthropathy or other focal bone abnormality. Soft tissues are unremarkable. IMPRESSION: Negative. Electronically Signed   By: Nolon Nations M.D.   On: 03/24/2022 14:59    Positive ROS: All other systems have been reviewed and were otherwise negative with the exception of those mentioned in the HPI and as above.  Physical Exam: General: Alert, no acute distress Cardiovascular: No pedal edema Respiratory: No cyanosis, no use of accessory musculature GI: No organomegaly, abdomen is soft and non-tender Skin: No lesions in the area of chief complaint Neurologic: Sensation intact distally Psychiatric: Patient is competent for consent with normal mood and affect Lymphatic: No axillary or cervical lymphadenopathy  MUSCULOSKELETAL:   Right hand: There is erythema and mild swelling along the dorsum of the hand with tenderness most focal along the dorsal aspect of the fourth MCP joint and along the proximal phalanx.  There is a healing puncture wound overlying the PIP joint on the palmar aspect of the ring finger.  Finger is held in a neutral position, there is minimal pain with full passive extension, there is pain with flexion along the dorsum of the finger and MCP joint, there is no fusiform swelling, there is no tenderness distally along the flexor tendon sheath and minimal tenderness overlying the puncture  wound   Assessment: Right ring finger dog bite with associated finger and hand cellulitis, swelling; clinical signs are not concerning for flexor tenosynovitis at this time  Plan: Recommend continuing IV antibiotics and strict hand elevation.  Will continue to monitor for improvement or any progression of clinical signs suggestive of flexor tenosynovitis.  No plan for surgery at this time.    Renee Harder, MD    03/25/2022 10:05 AM

## 2022-03-26 ENCOUNTER — Other Ambulatory Visit (INDEPENDENT_AMBULATORY_CARE_PROVIDER_SITE_OTHER): Payer: Self-pay | Admitting: Family Medicine

## 2022-03-26 DIAGNOSIS — E559 Vitamin D deficiency, unspecified: Secondary | ICD-10-CM

## 2022-03-26 DIAGNOSIS — E1169 Type 2 diabetes mellitus with other specified complication: Secondary | ICD-10-CM | POA: Diagnosis not present

## 2022-03-26 DIAGNOSIS — S61451A Open bite of right hand, initial encounter: Secondary | ICD-10-CM | POA: Diagnosis not present

## 2022-03-26 DIAGNOSIS — L03113 Cellulitis of right upper limb: Secondary | ICD-10-CM | POA: Diagnosis not present

## 2022-03-26 DIAGNOSIS — E871 Hypo-osmolality and hyponatremia: Secondary | ICD-10-CM

## 2022-03-26 DIAGNOSIS — L089 Local infection of the skin and subcutaneous tissue, unspecified: Secondary | ICD-10-CM | POA: Diagnosis not present

## 2022-03-26 LAB — RENAL FUNCTION PANEL
Albumin: 3.1 g/dL — ABNORMAL LOW (ref 3.5–5.0)
Anion gap: 8 (ref 5–15)
BUN: 10 mg/dL (ref 6–20)
CO2: 26 mmol/L (ref 22–32)
Calcium: 8.7 mg/dL — ABNORMAL LOW (ref 8.9–10.3)
Chloride: 100 mmol/L (ref 98–111)
Creatinine, Ser: 0.56 mg/dL (ref 0.44–1.00)
GFR, Estimated: >60 mL/min (ref >60)
Glucose, Bld: 206 mg/dL — ABNORMAL HIGH (ref 70–99)
Phosphorus: 4.1 mg/dL (ref 2.5–4.6)
Potassium: 3.6 mmol/L (ref 3.5–5.1)
Sodium: 134 mmol/L — ABNORMAL LOW (ref 135–145)

## 2022-03-26 LAB — LIPID PANEL
Cholesterol: 80 mg/dL (ref 0–200)
HDL: 40 mg/dL — ABNORMAL LOW (ref >40)
LDL Cholesterol: 33 mg/dL (ref 0–99)
Total CHOL/HDL Ratio: 2 RATIO
Triglycerides: 35 mg/dL (ref <150)
VLDL: 7 mg/dL (ref 0–40)

## 2022-03-26 LAB — GLUCOSE, CAPILLARY
Glucose-Capillary: 212 mg/dL — ABNORMAL HIGH (ref 70–99)
Glucose-Capillary: 293 mg/dL — ABNORMAL HIGH (ref 70–99)
Glucose-Capillary: 91 mg/dL (ref 70–99)
Glucose-Capillary: 177 mg/dL — ABNORMAL HIGH (ref 70–99)

## 2022-03-26 LAB — CBC
HCT: 39.2 % (ref 36.0–46.0)
Hemoglobin: 13.2 g/dL (ref 12.0–15.0)
MCH: 31.9 pg (ref 26.0–34.0)
MCHC: 33.7 g/dL (ref 30.0–36.0)
MCV: 94.7 fL (ref 80.0–100.0)
Platelets: 340 10*3/uL (ref 150–400)
RBC: 4.14 MIL/uL (ref 3.87–5.11)
RDW: 11.9 % (ref 11.5–15.5)
WBC: 7.7 10*3/uL (ref 4.0–10.5)
nRBC: 0 % (ref 0.0–0.2)

## 2022-03-26 LAB — MAGNESIUM: Magnesium: 2 mg/dL (ref 1.7–2.4)

## 2022-03-26 LAB — HIV ANTIBODY (ROUTINE TESTING W REFLEX): HIV Screen 4th Generation wRfx: NONREACTIVE

## 2022-03-26 MED ORDER — INSULIN GLARGINE-YFGN 100 UNIT/ML ~~LOC~~ SOLN
15.0000 [IU] | Freq: Two times a day (BID) | SUBCUTANEOUS | Status: DC
  Administered 2022-03-26 – 2022-03-27 (×2): 15 [IU] via SUBCUTANEOUS
  Filled 2022-03-26 (×2): qty 0.15

## 2022-03-26 MED ORDER — INSULIN ASPART 100 UNIT/ML IJ SOLN
4.0000 [IU] | Freq: Three times a day (TID) | INTRAMUSCULAR | Status: DC
  Administered 2022-03-26 (×2): 4 [IU] via SUBCUTANEOUS
  Filled 2022-03-26 (×2): qty 1

## 2022-03-26 MED ORDER — INSULIN ASPART 100 UNIT/ML IJ SOLN
5.0000 [IU] | Freq: Three times a day (TID) | INTRAMUSCULAR | Status: DC
  Administered 2022-03-27: 5 [IU] via SUBCUTANEOUS
  Filled 2022-03-26: qty 1

## 2022-03-26 NOTE — Progress Notes (Signed)
PROGRESS NOTE  Veronica Norton ZOX:[id number] DOB: 05-Dec-1975   PCP: Leone Haven, MD  Patient is from: Home.  Lives with family.  DOA: 03/24/2022 LOS: 1  Chief complaints Chief Complaint  Patient presents with   Hand Pain     Brief Narrative / Interim history: 47 year old F with PMH of HTN, DM-2, HLD and morbid obesity presenting with worsening right hand and finger swelling and pain after bite by family dog 2 days prior.  Vital stable.  WBC 10.3.  X-ray of right hand without fluid collection or osseous abnormalities.  Had a Tdap in ED.  Started on IV Unasyn.  Orthopedic surgery consulted, and recommended continuing IV antibiotics.   Symptoms improving.  No plan for surgical intervention.  Likely home on p.o. Augmentin 03/27/2022.  Subjective: Seen and examined earlier this morning.  No major events overnight of this morning.  Reports improvement in swelling, pain and range of motion.  Objective: Vitals:   03/25/22 2040 03/26/22 0633 03/26/22 0727 03/26/22 0727  BP: 131/72 121/77 118/62 118/62  Pulse: 71 61 71 74  Resp: '16 16 16 16  '$ Temp: 97.7 F (36.5 C) 98.2 F (36.8 C) 98 F (36.7 C) 98 F (36.7 C)  TempSrc: Oral Oral    SpO2: 100% 98% 98% 98%    Examination:  GENERAL: No apparent distress.  Nontoxic. HEENT: MMM.  Vision and hearing grossly intact.  NECK: Supple.  No apparent JVD.  RESP:  No IWOB.  Fair aeration bilaterally. CVS:  RRR. Heart sounds normal.  ABD/GI/GU: BS+. Abd soft, NTND.  MSK/EXT: Mild swelling right ring finger distally.  Healing puncture wound over palmar aspect of PIP joint.  Fair range of motion but not able to make complete fist SKIN: As above. NEURO: Awake and alert. Oriented appropriately.  No apparent focal neuro deficit. PSYCH: Calm. Normal affect.   Procedures:  None  Microbiology summarized: Blood cultures NGTD  Assessment and plan: Principal Problem:   Dog bite of right hand with infection Active Problems:   Dog  bite of right hand   Cellulitis of right hand   Diabetes mellitus type 2 in obese ([place])   Hypertension associated with diabetes ([place])   HLD (hyperlipidemia)   Obesity, Class III, BMI 40-49.9 (morbid obesity) ([place])   Dog bite of right hand with infection/cellulitis.  X-ray without osseous finding or abscess.  Some improvement in swelling and pain.  No constitutional symptoms.  Blood cultures NGTD.  Had Tdap in ED. -Appreciate input by orthopedic surgery -Continue IV Unasyn today.  Plan to transition to p.o. Augmentin on discharge -Pain control-Tylenol, Aleve and Percocet based on pain severity -Right arm elevation and full range of motion exercise  Uncontrolled NIDDM-2 with hyperglycemia and HLD: A1c 12.4%.  Suspect dietary and medication noncompliance.  Admits to not using Ozempic.  She is on low-dose glipizide and metformin Recent Labs  Lab 03/25/22 1252 03/25/22 1607 03/25/22 2042 03/26/22 0728 03/26/22 1140  GLUCAP 332* 249* 213* 212* 293*  -Continue SSI-moderate. -Increase Semglee from 15 units daily to 15 units twice daily -Add NovoLog 5 units 3 times daily with meals -Further adjustment as appropriate. -Continue home Crestor. -Consult diabetic coordinator   Essential hypertension: Normotensive. -Continue home HCTZ  Hyponatremia: Mild.  Corrects to normal for hyperglycemia.  History of obesity: -Check BMI. -On Ozempic. There is no height or weight on file to calculate BMI.   DVT prophylaxis:  Place and maintain sequential compression device Start: 03/24/22 1643  Code Status: Full code  Family Communication: None at bedside. Level of care: Med-Surg Status is: Inpatient Remains inpatient appropriate because: Dog bite of right hand with infection   Final disposition: Likely home once medically cleared Consultants:  Orthopedic surgery  35 minutes with more than 50% spent in reviewing records, counseling patient/family and coordinating care.   Sch Meds:   Scheduled Meds:  hydrochlorothiazide  12.5 mg Oral Daily   insulin aspart  0-15 Units Subcutaneous TID WC   insulin aspart  0-5 Units Subcutaneous QHS   insulin aspart  4 Units Subcutaneous TID WC   insulin glargine-yfgn  15 Units Subcutaneous Daily   rosuvastatin  20 mg Oral Daily   Continuous Infusions:  ampicillin-sulbactam (UNASYN) IV Stopped (03/26/22 0925)   PRN Meds:.acetaminophen, hydrALAZINE, naproxen, ondansetron (ZOFRAN) IV, oxyCODONE-acetaminophen  Antimicrobials: Anti-infectives (From admission, onward)    Start     Dose/Rate Route Frequency Ordered Stop   03/24/22 2200  Ampicillin-Sulbactam (UNASYN) 3 g in sodium chloride 0.9 % 100 mL IVPB        3 g 200 mL/hr over 30 Minutes Intravenous Every 6 hours 03/24/22 1901     03/24/22 1700  vancomycin (VANCOREADY) IVPB 1500 mg/300 mL       See Hyperspace for full Linked Orders Report.   1,500 mg 150 mL/hr over 120 Minutes Intravenous  Once 03/24/22 1557 03/24/22 2259   03/24/22 1600  vancomycin (VANCOCIN) IVPB 1000 mg/200 mL premix       See Hyperspace for full Linked Orders Report.   1,000 mg 200 mL/hr over 60 Minutes Intravenous  Once 03/24/22 1557 03/24/22 1849   03/24/22 1515  Ampicillin-Sulbactam (UNASYN) 3 g in sodium chloride 0.9 % 100 mL IVPB        3 g 200 mL/hr over 30 Minutes Intravenous  Once 03/24/22 1514 03/24/22 1713        I have personally reviewed the following labs and images: CBC: Recent Labs  Lab 03/24/22 1415 03/25/22 0515 03/26/22 0420  WBC 10.3 7.7 7.7  NEUTROABS 7.0  --   --   HGB 14.1 12.7 13.2  HCT 42.3 37.9 39.2  MCV 95.7 95.2 94.7  PLT 350 322 340   BMP &GFR Recent Labs  Lab 03/24/22 1415 03/25/22 0515 03/26/22 0420  NA 133* 135 134*  K 4.2 3.6 3.6  CL 100 104 100  CO2 '25 23 26  '$ GLUCOSE 340* 174* 206*  BUN '13 10 10  '$ CREATININE 0.60 0.61 0.56  CALCIUM 8.3* 7.9* 8.7*  MG  --   --  2.0  PHOS  --   --  4.1   CrCl cannot be calculated (Unknown ideal weight.). Liver &  Pancreas: Recent Labs  Lab 03/26/22 0420  ALBUMIN 3.1*   No results for input(s): "LIPASE", "AMYLASE" in the last 168 hours. No results for input(s): "AMMONIA" in the last 168 hours. Diabetic: Recent Labs    03/25/22 0511  HGBA1C 12.4*   Recent Labs  Lab 03/25/22 1252 03/25/22 1607 03/25/22 2042 03/26/22 0728 03/26/22 1140  GLUCAP 332* 249* 213* 212* 293*   Cardiac Enzymes: No results for input(s): "CKTOTAL", "CKMB", "CKMBINDEX", "TROPONINI" in the last 168 hours. No results for input(s): "PROBNP" in the last 8760 hours. Coagulation Profile: Recent Labs  Lab 03/24/22 1919  INR 1.0   Thyroid Function Tests: No results for input(s): "TSH", "T4TOTAL", "FREET4", "T3FREE", "THYROIDAB" in the last 72 hours. Lipid Profile: Recent Labs    03/26/22 0420  CHOL 80  HDL 40*  LDLCALC 33  TRIG  35  CHOLHDL 2.0   Anemia Panel: No results for input(s): "VITAMINB12", "FOLATE", "FERRITIN", "TIBC", "IRON", "RETICCTPCT" in the last 72 hours. Urine analysis: No results found for: "COLORURINE", "APPEARANCEUR", "LABSPEC", "PHURINE", "GLUCOSEU", "HGBUR", "BILIRUBINUR", "KETONESUR", "PROTEINUR", "UROBILINOGEN", "NITRITE", "LEUKOCYTESUR" Sepsis Labs: Invalid input(s): "PROCALCITONIN", "LACTICIDVEN"  Microbiology: Recent Results (from the past 240 hour(s))  Culture, blood (Routine X 2) w Reflex to ID Panel     Status: None (Preliminary result)   Collection Time: 03/24/22  7:19 PM   Specimen: BLOOD  Result Value Ref Range Status   Specimen Description BLOOD BLOOD LEFT HAND  Final   Special Requests   Final    BOTTLES DRAWN AEROBIC AND ANAEROBIC Blood Culture adequate volume   Culture   Final    NO GROWTH 2 DAYS Performed at [facility], [address] [place], [place] [address]    Report Status PENDING  Incomplete  Culture, blood (Routine X 2) w Reflex to ID Panel     Status: None (Preliminary result)   Collection Time: 03/24/22  7:29 PM   Specimen: BLOOD  Result  Value Ref Range Status   Specimen Description BLOOD BLOOD RIGHT HAND  Final   Special Requests   Final    BOTTLES DRAWN AEROBIC AND ANAEROBIC Blood Culture adequate volume   Culture   Final    NO GROWTH 2 DAYS Performed at [facility], [address], [place], [place] [address]    Report Status PENDING  Incomplete    Radiology Studies: No results found.    Veronica Vignola [place]  If 7PM-7AM, please contact night-coverage [email or web address] 03/26/2022, 1:31 PM

## 2022-03-26 NOTE — Progress Notes (Signed)
Subjective:  Patient reports that pain and swelling in the hand have improved.  Objective:   VITALS:   Vitals:   03/25/22 2040 03/26/22 0633 03/26/22 0727 03/26/22 0727  BP: 131/72 121/77 118/62 118/62  Pulse: 71 61 71 74  Resp: '16 16 16 16  '$ Temp: 97.7 F (36.5 C) 98.2 F (36.8 C) 98 F (36.7 C) 98 F (36.7 C)  TempSrc: Oral Oral    SpO2: 100% 98% 98% 98%    PHYSICAL EXAM:  General: Alert, comfortable Right hand: Minimal swelling overlying the dorsum of the ring finger MCP joint, minimal swelling along the ring finger, finger rests in an extended position, very mild tenderness overlying the puncture wound on the palmar aspect of the ring finger as well as the dorsum of the hand, no erythema, no fusiform swelling of the ring finger, no pain with passive extension, no notable tenderness to palpation along the flexor tendon sheath distal to the puncture wound, some limitation with finger flexion secondary to discomfort on the dorsum of the hand  LABS  Results for orders placed or performed during the hospital encounter of 03/24/22 (from the past 24 hour(s))  Glucose, capillary     Status: Abnormal   Collection Time: 03/25/22  7:54 AM  Result Value Ref Range   Glucose-Capillary 205 (H) 70 - 99 mg/dL  Glucose, capillary     Status: Abnormal   Collection Time: 03/25/22 12:52 PM  Result Value Ref Range   Glucose-Capillary 332 (H) 70 - 99 mg/dL  Glucose, capillary     Status: Abnormal   Collection Time: 03/25/22  4:07 PM  Result Value Ref Range   Glucose-Capillary 249 (H) 70 - 99 mg/dL  Glucose, capillary     Status: Abnormal   Collection Time: 03/25/22  8:42 PM  Result Value Ref Range   Glucose-Capillary 213 (H) 70 - 99 mg/dL  Renal function panel     Status: Abnormal   Collection Time: 03/26/22  4:20 AM  Result Value Ref Range   Sodium 134 (L) 135 - 145 mmol/L   Potassium 3.6 3.5 - 5.1 mmol/L   Chloride 100 98 - 111 mmol/L   CO2 26 22 - 32 mmol/L   Glucose, Bld 206 (H)  70 - 99 mg/dL   BUN 10 6 - 20 mg/dL   Creatinine, Ser 0.56 0.44 - 1.00 mg/dL   Calcium 8.7 (L) 8.9 - 10.3 mg/dL   Phosphorus 4.1 2.5 - 4.6 mg/dL   Albumin 3.1 (L) 3.5 - 5.0 g/dL   GFR, Estimated >60 >60 mL/min   Anion gap 8 5 - 15  Magnesium     Status: None   Collection Time: 03/26/22  4:20 AM  Result Value Ref Range   Magnesium 2.0 1.7 - 2.4 mg/dL  CBC     Status: None   Collection Time: 03/26/22  4:20 AM  Result Value Ref Range   WBC 7.7 4.0 - 10.5 K/uL   RBC 4.14 3.87 - 5.11 MIL/uL   Hemoglobin 13.2 12.0 - 15.0 g/dL   HCT 39.2 36.0 - 46.0 %   MCV 94.7 80.0 - 100.0 fL   MCH 31.9 26.0 - 34.0 pg   MCHC 33.7 30.0 - 36.0 g/dL   RDW 11.9 11.5 - 15.5 %   Platelets 340 150 - 400 K/uL   nRBC 0.0 0.0 - 0.2 %  Lipid panel     Status: Abnormal   Collection Time: 03/26/22  4:20 AM  Result Value Ref Range  Cholesterol 80 0 - 200 mg/dL   Triglycerides 35 <150 mg/dL   HDL 40 (L) >40 mg/dL   Total CHOL/HDL Ratio 2.0 RATIO   VLDL 7 0 - 40 mg/dL   LDL Cholesterol 33 0 - 99 mg/dL  Glucose, capillary     Status: Abnormal   Collection Time: 03/26/22  7:28 AM  Result Value Ref Range   Glucose-Capillary 212 (H) 70 - 99 mg/dL    DG Hand Complete Right  Result Date: 03/24/2022 CLINICAL DATA:  Infection.  Foreign body. EXAM: RIGHT HAND - COMPLETE 3+ VIEW COMPARISON:  None Available. FINDINGS: There is no evidence of fracture or dislocation. There is no evidence of arthropathy or other focal bone abnormality. Soft tissues are unremarkable. IMPRESSION: Negative. Electronically Signed   By: Nolon Nations M.D.   On: 03/24/2022 14:59    Assessment/Plan:  Right hand dog bite, cellulitis improving on IV antibiotics, no concern for flexor tenosynovitis  Patient has shown symptomatic improvement with IV antibiotics over the last 36 hours.  There is no need for surgical intervention.  Recommend continued elevation and antibiotics.  Okay to discharge with p.o. antibiotics later today versus tomorrow  pending symptom improvement.   Renee Harder , MD 03/26/2022, 7:35 AM

## 2022-03-27 ENCOUNTER — Encounter: Payer: Self-pay | Admitting: Student

## 2022-03-27 DIAGNOSIS — S61451A Open bite of right hand, initial encounter: Secondary | ICD-10-CM | POA: Diagnosis not present

## 2022-03-27 DIAGNOSIS — E1165 Type 2 diabetes mellitus with hyperglycemia: Secondary | ICD-10-CM | POA: Diagnosis not present

## 2022-03-27 DIAGNOSIS — L089 Local infection of the skin and subcutaneous tissue, unspecified: Secondary | ICD-10-CM | POA: Diagnosis not present

## 2022-03-27 DIAGNOSIS — L03113 Cellulitis of right upper limb: Secondary | ICD-10-CM | POA: Diagnosis not present

## 2022-03-27 LAB — GLUCOSE, CAPILLARY: Glucose-Capillary: 179 mg/dL — ABNORMAL HIGH (ref 70–99)

## 2022-03-27 MED ORDER — AMOXICILLIN-POT CLAVULANATE 875-125 MG PO TABS: 1.0000 | ORAL_TABLET | Freq: Two times a day (BID) | ORAL | 0 refills | Status: AC

## 2022-03-27 MED ORDER — GLIPIZIDE 10 MG PO TABS: 10.0000 mg | ORAL_TABLET | Freq: Two times a day (BID) | ORAL | 1 refills | Status: DC

## 2022-03-27 NOTE — Progress Notes (Signed)
  Subjective:  Patient reports that pain and swelling in the hand have continued to improve.  She is intermittent discomfort along the dorsal aspect of the ring finger.  Objective:   VITALS:   Vitals:   03/26/22 1619 03/26/22 2203 03/27/22 0453 03/27/22 0751  BP: (!) 116/57 130/85 105/62 130/63  Pulse: 87 74 69 68  Resp: '18 18 16 16  '$ Temp: 98.2 F (36.8 C) 97.9 F (36.6 C) 98.2 F (36.8 C) 99.6 F (37.6 C)  TempSrc:  Oral Oral   SpO2: 96% 98% 98% 97%    PHYSICAL EXAM:  General: Alert, comfortable Right hand: Minimal swelling overlying the dorsum of the ring finger MCP joint, no swelling along the ring finger.  Patient tolerates active and passive range of motion of the MCP PIP and DIP joints.  Very mild discomfort with palpation along the dorsum of the MCP joint and P1.  No erythema, no fusiform swelling of the ring finger, no pain with passive extension, no notable tenderness to palpation along the flexor tendon sheath distal to the puncture wound  LABS  Results for orders placed or performed during the hospital encounter of 03/24/22 (from the past 24 hour(s))  Glucose, capillary     Status: Abnormal   Collection Time: 03/26/22 11:40 AM  Result Value Ref Range   Glucose-Capillary 293 (H) 70 - 99 mg/dL  Glucose, capillary     Status: None   Collection Time: 03/26/22  4:16 PM  Result Value Ref Range   Glucose-Capillary 91 70 - 99 mg/dL  Glucose, capillary     Status: Abnormal   Collection Time: 03/26/22  9:42 PM  Result Value Ref Range   Glucose-Capillary 177 (H) 70 - 99 mg/dL    No results found.  Assessment/Plan:  Right hand dog bite, cellulitis improving on IV antibiotics, no concern for flexor tenosynovitis  Patient has continued to show symptomatic improvement with IV antibiotics.  There is no need for surgical intervention.  I feel that she can be discharged with oral antibiotics at this time.  She should continue with elevation and activity modification until  resolution of cellulitic type symptoms.   Renee Harder , MD 03/27/2022, 7:52 AM

## 2022-03-27 NOTE — TOC CM/SW Note (Signed)
  Transition of Care [facility]) Screening Note   Patient Details  Name: Veronica Norton Date of Birth: 1975/10/02   Transition of Care [facility]) CM/SW Contact:    Gerilyn Pilgrim, LCSW Phone Number: 03/27/2022, 9:06 AM    Transition of Care Department [facility]) has reviewed patient and no TOC needs have been identified at this time. We will continue to monitor patient advancement through interdisciplinary progression rounds. If new patient transition needs arise, please place a TOC consult.

## 2022-03-27 NOTE — Discharge Summary (Signed)
Physician Discharge Summary  Veronica Norton ZOX:[id number] DOB: 20-Sep-1975 DOA: 03/24/2022  PCP: Leone Haven, MD  Admit date: 03/24/2022 Discharge date: 03/27/2022 Admitted From: Home Disposition: Home Recommendations for Outpatient Follow-up:  Follow up with PCP in in 1 to 2 weeks or sooner if needed Outpatient follow-up with endocrinology as previously planned Check blood glucose control, CMP and CBC at follow-up Please follow up on the following pending results: None  Home Health: Not indicated Equipment/Devices: Not indicated  Discharge Condition: Stable CODE STATUS: Full code  Follow-up Information     Leone Haven, MD. Schedule an appointment as soon as possible for a visit in 1 week(s).   Specialty: Family Medicine Contact information: [place] [address] [phone]                 Hospital course 47 year old F with PMH of HTN, DM-2, HLD and morbid obesity presenting with worsening right hand and finger swelling and pain after bite by family dog 2 days prior.  Vital stable.  WBC 10.3.  X-ray of right hand without fluid collection or osseous abnormalities.  Had a Tdap in ED.  Started on IV Unasyn.  Orthopedic surgery consulted, and recommended continuing IV antibiotics.    Patient's swelling and pain improved on IV antibiotics.  Patient was cleared for discharge by orthopedic surgery on oral antibiotics.  He is discharged on p.o. Augmentin for 10 more days to complete treatment course.  We have discussed the importance of completing the whole course of antibiotics and having her diabetes under good control.  We have increased glipizide to 10 mg twice a day.  Patient to start using Ozempic.  She will continue home metformin.  She also coming appointment with endocrinologist at the end of this week.  Also discussed about lifestyle change including diet and exercise.  See individual problem list below for more.   Problems  addressed during this hospitalization Principal Problem:   Dog bite of right hand with infection Active Problems:   Dog bite of right hand   Cellulitis of right hand   Uncontrolled type 2 diabetes mellitus with hyperglycemia, without long-term current use of insulin (HCC)   Hypertension associated with diabetes ([place])   HLD (hyperlipidemia)   Obesity, Class III, BMI 40-49.9 (morbid obesity) ([place]) Hyponatremia            Vital signs Vitals:   03/26/22 2203 03/27/22 0453 03/27/22 0751 03/27/22 0842  BP: 130/85 105/62 130/63   Pulse: 74 69 68   Temp: 97.9 F (36.6 C) 98.2 F (36.8 C) 99.6 F (37.6 C)   Resp: '18 16 16   '$ Height:    '5\' 5"'$  (1.651 m)  SpO2: 98% 98% 97%   TempSrc: Oral Oral       Discharge exam  GENERAL: No apparent distress.  Nontoxic. HEENT: MMM.  Vision and hearing grossly intact.  NECK: Supple.  No apparent JVD.  RESP:  No IWOB.  Fair aeration bilaterally. CVS:  RRR. Heart sounds normal.  ABD/GI/GU: BS+. Abd soft, NTND.  MSK/EXT:  Moves extremities.  Mild swelling right ring finger distally.  Healing puncture wound over palmar aspect of PIP joint.  Good range of motion, near complete fist and extension. SKIN: As above. NEURO: Awake and alert. Oriented appropriately.  No apparent focal neuro deficit. PSYCH: Calm. Normal affect.   Discharge Instructions Discharge Instructions     Diet - low sodium heart healthy   Complete by: As directed  Diet Carb Modified   Complete by: As directed    Discharge instructions   Complete by: As directed    It has been a pleasure taking care of you!  You were hospitalized due to right hand infection after dog bite.  Your symptoms improved with IV antibiotics.  We are discharging you on oral antibiotics to complete treatment course.  It is very important to take the whole course of antibiotic regardless of improvement.  Besides, it is very important to have your blood glucose under good control.  We have increased your  glipizide to 10 mg twice a day.  Continue metformin.  Start using Ozempic as well. We also recommend lifestyle change including regular exercise and dietary modification.  Follow-up with your primary care doctor 1 to 2 weeks or sooner if needed.   Take care,   Increase activity slowly   Complete by: As directed    No wound care   Complete by: As directed       Allergies as of 03/27/2022   No Known Allergies      Medication List     TAKE these medications    amoxicillin-clavulanate 875-125 MG tablet Commonly known as: AUGMENTIN Take 1 tablet by mouth 2 (two) times daily for 10 days.   FreeStyle Libre 3 Sensor Misc Apply 1 each topically every 14 (fourteen) days. Place 1 sensor on the skin every 14 days. Use to check glucose continuously   glipiZIDE 10 MG tablet Commonly known as: GLUCOTROL Take 1 tablet (10 mg total) by mouth 2 (two) times daily before a meal. What changed:  medication strength how much to take when to take this   glucose monitoring kit monitoring kit Use as directed to check sugars bid   hydrochlorothiazide 12.5 MG capsule Commonly known as: MICROZIDE TAKE 1 CAPSULE BY MOUTH EVERY DAY   metFORMIN 500 MG 24 hr tablet Commonly known as: GLUCOPHAGE-XR TAKE 2 TABLETS BY MOUTH TWICE A DAY   norethindrone 0.35 MG tablet Commonly known as: Camila Take 1 tablet (0.35 mg total) by mouth daily.   rosuvastatin 20 MG tablet Commonly known as: CRESTOR TAKE 1 TABLET BY MOUTH EVERY DAY   Semaglutide(0.25 or 0.'5MG'$ /DOS) 2 MG/3ML Sopn Inject 0.25 mg into the skin once a week for 28 days, THEN 0.5 mg once a week. Start taking on: December 21, 2021   Vitamin D (Ergocalciferol) 1.25 MG (50000 UNIT) Caps capsule Commonly known as: DRISDOL Take 1 capsule (50,000 Units total) by mouth every 7 (seven) days.        Consultations: Orthopedic surgery  Procedures/Studies:   DG Hand Complete Right  Result Date: 03/24/2022 CLINICAL DATA:  Infection.   Foreign body. EXAM: RIGHT HAND - COMPLETE 3+ VIEW COMPARISON:  None Available. FINDINGS: There is no evidence of fracture or dislocation. There is no evidence of arthropathy or other focal bone abnormality. Soft tissues are unremarkable. IMPRESSION: Negative. Electronically Signed   By: Nolon Nations M.D.   On: 03/24/2022 14:59       The results of significant diagnostics from this hospitalization (including imaging, microbiology, ancillary and laboratory) are listed below for reference.     Microbiology: Recent Results (from the past 240 hour(s))  Culture, blood (Routine X 2) w Reflex to ID Panel     Status: None (Preliminary result)   Collection Time: 03/24/22  7:19 PM   Specimen: BLOOD  Result Value Ref Range Status   Specimen Description BLOOD BLOOD LEFT HAND  Final   Special  Requests   Final    BOTTLES DRAWN AEROBIC AND ANAEROBIC Blood Culture adequate volume   Culture   Final    NO GROWTH 3 DAYS Performed at [facility], [address] [place], [place] [address]    Report Status PENDING  Incomplete  Culture, blood (Routine X 2) w Reflex to ID Panel     Status: None (Preliminary result)   Collection Time: 03/24/22  7:29 PM   Specimen: BLOOD  Result Value Ref Range Status   Specimen Description BLOOD BLOOD RIGHT HAND  Final   Special Requests   Final    BOTTLES DRAWN AEROBIC AND ANAEROBIC Blood Culture adequate volume   Culture   Final    NO GROWTH 3 DAYS Performed at [facility], [place], [place] [address]    Report Status PENDING  Incomplete     Labs:  CBC: Recent Labs  Lab 03/24/22 1415 03/25/22 0515 03/26/22 0420  WBC 10.3 7.7 7.7  NEUTROABS 7.0  --   --   HGB 14.1 12.7 13.2  HCT 42.3 37.9 39.2  MCV 95.7 95.2 94.7  PLT 350 322 340   BMP &GFR Recent Labs  Lab 03/24/22 1415 03/25/22 0515 03/26/22 0420  NA 133* 135 134*  K 4.2 3.6 3.6  CL 100 104 100  CO2 '25 23 26  '$ GLUCOSE 340* 174* 206*  BUN '13 10 10   '$ CREATININE 0.60 0.61 0.56  CALCIUM 8.3* 7.9* 8.7*  MG  --   --  2.0  PHOS  --   --  4.1   CrCl cannot be calculated (Unknown ideal weight.). Liver & Pancreas: Recent Labs  Lab 03/26/22 0420  ALBUMIN 3.1*   No results for input(s): "LIPASE", "AMYLASE" in the last 168 hours. No results for input(s): "AMMONIA" in the last 168 hours. Diabetic: Recent Labs    03/25/22 0511  HGBA1C 12.4*   Recent Labs  Lab 03/26/22 0728 03/26/22 1140 03/26/22 1616 03/26/22 2142 03/27/22 0809  GLUCAP 212* 293* 91 177* 179*   Cardiac Enzymes: No results for input(s): "CKTOTAL", "CKMB", "CKMBINDEX", "TROPONINI" in the last 168 hours. No results for input(s): "PROBNP" in the last 8760 hours. Coagulation Profile: Recent Labs  Lab 03/24/22 1919  INR 1.0   Thyroid Function Tests: No results for input(s): "TSH", "T4TOTAL", "FREET4", "T3FREE", "THYROIDAB" in the last 72 hours. Lipid Profile: Recent Labs    03/26/22 0420  CHOL 80  HDL 40*  LDLCALC 33  TRIG 35  CHOLHDL 2.0   Anemia Panel: No results for input(s): "VITAMINB12", "FOLATE", "FERRITIN", "TIBC", "IRON", "RETICCTPCT" in the last 72 hours. Urine analysis: No results found for: "COLORURINE", "APPEARANCEUR", "LABSPEC", "PHURINE", "GLUCOSEU", "HGBUR", "BILIRUBINUR", "KETONESUR", "PROTEINUR", "UROBILINOGEN", "NITRITE", "LEUKOCYTESUR" Sepsis Labs: Invalid input(s): "PROCALCITONIN", "LACTICIDVEN"   SIGNED:  Mercy Riding, MD  [facility] 03/27/2022, 1:50 PM

## 2022-03-27 NOTE — Inpatient Diabetes Management (Signed)
Inpatient Diabetes Program Recommendations  AACE/ADA: New Consensus Statement on Inpatient Glycemic Control  Target Ranges:  Prepandial:   less than 140 mg/dL      Peak postprandial:   less than 180 mg/dL (1-2 hours)      Critically ill patients:  140 - 180 mg/dL    Latest Reference Range & Units 03/26/22 07:28 03/26/22 11:40 03/26/22 16:16 03/26/22 21:42 03/27/22 08:09  Glucose-Capillary 70 - 99 mg/dL 212 (H) 293 (H) 91 177 (H) 179 (H)    Latest Reference Range & Units 07/01/21 14:36 11/16/21 08:45 03/25/22 05:11  Hemoglobin A1C 4.8 - 5.6 % 10.0 (H) 12.4 (H) 12.4 (H)   Review of Glycemic Control  Diabetes history: DM2 Outpatient Diabetes medications: Metformin XR 1000 mg BID, Glipizide 5 mg BID, Ozempic 0.25 mg Qweek Current orders for Inpatient glycemic control: Semglee 15 units BID, Novolog 5 units TID with meals, Novolog 0-15 units TID with meals, Novolog 0-5 units QHS  Inpatient Diabetes Program Recommendations:    HbgA1C: A1C 12.4% on 03/25/22 indicating an average glucose of 309 mg/dl over the past 2-3 months.  Addendum 03/27/22 '@10'$ :00- Noted consult and already has discharge order in.  Spoke with patient over the phone about diabetes and home regimen for diabetes control. Patient reports being followed by PCP for diabetes management but has an initial visit with an Endocrinologist this month. Patient states that she is taking Metformin XR 1000 mg BID and Glipizide 5 mg daily, and has not started Ozemic yet. Patient notes that she ran out of Glipizide so she was using some she got from a friend and she was not taking Ozempic yet because she took [place] and it make her nauseous and vomit so she has been hesitant to start the [place]. Patient reports that she checks glucose but not very often because she hates to stick her finger. Patient used a sample FreeStyle Libre2 CGM for 2 weeks in the past and liked it but her copay was $75 per month so she could not afford to continue to use the  CGM.  Discussed A1C results (12.4% on 03/25/22) and explained that current A1C indicates an average glucose of 309 mg/dl over the past 2-3 months. Discussed glucose and A1C goals. Discussed importance of checking CBGs and maintaining good CBG control to prevent long-term and short-term complications. Explained how hyperglycemia leads to damage within blood vessels which lead to the common complications seen with uncontrolled diabetes. Stressed to the patient the importance of improving glycemic control to prevent further complications from uncontrolled diabetes. Discussed impact of nutrition, exercise, stress, sickness, and medications on diabetes control.  Patient admits that she could do better with following carb modified diet but she is a picky eater so there are lots of low carb foods that she does not like.  Discussed carbohydrates, carbohydrate goals per day and meal, along with portion sizes. Encouraged patient to check glucose 4 times per day (before meals and at bedtime) and to keep a log book of glucose readings and DM medication taken which patient will need to take to doctor appointments. Explained how the doctor can use the log book to continue to make adjustments with DM medications if needed.  Patient verbalized understanding of information discussed and reports no further questions at this time related to diabetes. Dr. Cyndia Skeeters provided order to give patient sample of FreeStyle Libre3 CGM sensors. Educated patient on FreeStyle Libre3 CGM regarding application and changing CGM sensor (alternate every 14 days on back of arms), 1 hour  warm-up, how to scan sensor to start a new sensor, and how to use app to check glucose.  Patient has been given Freestyle Libre3 sensor samples and applied sensor to back of right upper arm.  Provided educational packet regarding FreeStyle Libre3 CGM.  Patient plans to use android phone  FreeStyle Libre3 app to read FreeStyle Libre sensor and was able to download the app and  create an account. Patient could not get the app to pick up the sensor to start a new sensor and her family was in the room to take patient home. She plans to call 1-800 number for [employer] when she gets home so they can help her get the sensor connected to the app on her phone.  Informed patient that she can have PCP or Endocrinologist provide Rx for FreeStyle Libre3 sensors going forward if she decides to use it and can afford the copays. Asked patient to be sure to let PCP/Endocrinologist know about Maurine Simmering and allow provider to review reports from [place] app so the provider can use the information to continue to make adjustments with DM medications if needed. Patient verbalized understanding of information and has no questions at this time.   Thanks, Barnie Alderman, RN, MSN, CDE Diabetes Coordinator Inpatient Diabetes Program [phone] (Team Pager)

## 2022-03-28 ENCOUNTER — Telehealth: Payer: Self-pay

## 2022-03-28 NOTE — Patient Outreach (Signed)
  Care Coordination TOC Note Transition Care Management Unsuccessful Follow-up Telephone Call  Date of discharge and from where:  03/27/22-[facility]  Attempts:  1st Attempt  Reason for unsuccessful TCM follow-up call:  Unable to leave message     Enzo Montgomery, RN,BSN,CCM [place] Management Telephonic Care Management Coordinator Direct Phone: [phone] Toll Free: [phone] Fax: [phone]

## 2022-03-29 ENCOUNTER — Telehealth: Payer: Self-pay

## 2022-03-29 LAB — CULTURE, BLOOD (ROUTINE X 2)
Culture: NO GROWTH
Culture: NO GROWTH
Special Requests: ADEQUATE
Special Requests: ADEQUATE

## 2022-03-29 NOTE — Patient Outreach (Signed)
  Care Coordination TOC Note Transition Care Management Unsuccessful Follow-up Telephone Call  Date of discharge and from where:  03/27/22-[facility]   Attempts:  2nd Attempt  Reason for unsuccessful TCM follow-up call:  Unable to leave message on patient's primary number as voicemail full. RN CM contacted alternate number-patient's parent. Father states that best number to reach patient on is his cell that RN CM had already attempted.     Enzo Montgomery, RN,BSN,CCM [place] Management Telephonic Care Management Coordinator Direct Phone: [phone] Toll Free: [phone] Fax: [phone]

## 2022-03-29 NOTE — Patient Outreach (Signed)
  Care Coordination TOC Note Transition Care Management Unsuccessful Follow-up Telephone Call  Date of discharge and from where:  03/27/22-[facility]  Attempts:  3rd Attempt  Reason for unsuccessful TCM follow-up call:  Unable to reach patient     Enzo Montgomery, RN,BSN,CCM [place] Management Telephonic Care Management Coordinator Direct Phone: [phone] Toll Free: [phone] Fax: [phone]

## 2022-03-31 ENCOUNTER — Ambulatory Visit: Payer: BC Managed Care – PPO | Admitting: Internal Medicine

## 2022-03-31 NOTE — Progress Notes (Deleted)
Name: Veronica Norton  MRN/ DOB: [id number], Aug 27, 1975   Age/ Sex: 47 y.o., female    PCP: Leone Haven, MD   Reason for Endocrinology Evaluation: Type {NUMBERS 1 OR 2:522190} Diabetes Mellitus     Date of Initial Endocrinology Visit: 03/31/2022     PATIENT IDENTIFIER: Veronica Norton is a 47 y.o. female with a past medical history of ***. The patient presented for initial endocrinology clinic visit on 03/31/2022 for consultative assistance with her diabetes management.    HPI: Veronica Norton was    Diagnosed with DM *** Prior Medications tried/Intolerance: *** Currently checking blood sugars *** x / day,  before breakfast and ***.  Hypoglycemia episodes : ***               Symptoms: ***                 Frequency: ***/  Hemoglobin A1c has ranged from *** in ***, peaking at *** in ***. Patient required assistance for hypoglycemia:  Patient has required hospitalization within the last 1 year from hyper or hypoglycemia:   In terms of diet, the patient ***   HOME DIABETES REGIMEN: Basal: ***  Bolus: ***   Statin: {Yes/No:11203} ACE-I/ARB: {YES/NO:17245} Prior Diabetic Education: {Yes/No:11203}   METER DOWNLOAD SUMMARY: Date range evaluated: *** Fingerstick Blood Glucose Tests = *** Average Number Tests/Day = *** Overall Mean FS Glucose = *** Standard Deviation = ***  BG Ranges: Low = *** High = ***   Hypoglycemic Events/30 Days: BG < 50 = *** Episodes of symptomatic severe hypoglycemia = ***   DIABETIC COMPLICATIONS: Microvascular complications:  *** Denies: *** Last eye exam: Completed   Macrovascular complications:  *** Denies: CAD, PVD, CVA   PAST HISTORY: Past Medical History:  Past Medical History:  Diagnosis Date   Diabetes mellitus    H/O pilonidal cyst    Hypertension    Normal cardiac stress test 2009   Pre-diabetes    Past Surgical History:  Past Surgical History:  Procedure Laterality Date   COLONOSCOPY WITH PROPOFOL  N/A 07/22/2021   Procedure: COLONOSCOPY WITH PROPOFOL;  Surgeon: Lin Landsman, MD;  Location: [facility] ENDOSCOPY;  Service: Gastroenterology;  Laterality: N/A;   PILONIDAL CYST EXCISION     WISDOM TOOTH EXTRACTION      Social History:  reports that she has never smoked. She has never used smokeless tobacco. She reports current alcohol use. She reports that she does not use drugs. Family History:  Family History  Problem Relation Age of Onset   Heart disease Mother        2005 stent placement   Hypertension Mother    Diabetes Father    Hypertension Father    Obesity Father    Stroke Maternal Aunt    Cancer Maternal Grandfather        prostate   Heart disease Maternal Grandfather    Heart disease Maternal Uncle    Heart disease Maternal Grandmother      HOME MEDICATIONS: Allergies as of 03/31/2022   No Known Allergies      Medication List        Accurate as of March 31, 2022  8:02 AM. If you have any questions, ask your nurse or doctor.          amoxicillin-clavulanate 875-125 MG tablet Commonly known as: AUGMENTIN Take 1 tablet by mouth 2 (two) times daily for 10 days.   FreeStyle Libre 3 Sensor Misc Apply 1 each topically  every 14 (fourteen) days. Place 1 sensor on the skin every 14 days. Use to check glucose continuously   glipiZIDE 10 MG tablet Commonly known as: GLUCOTROL Take 1 tablet (10 mg total) by mouth 2 (two) times daily before a meal.   glucose monitoring kit monitoring kit Use as directed to check sugars bid   hydrochlorothiazide 12.5 MG capsule Commonly known as: MICROZIDE TAKE 1 CAPSULE BY MOUTH EVERY DAY   metFORMIN 500 MG 24 hr tablet Commonly known as: GLUCOPHAGE-XR TAKE 2 TABLETS BY MOUTH TWICE A DAY   norethindrone 0.35 MG tablet Commonly known as: [place]ila Take 1 tablet (0.35 mg total) by mouth daily.   rosuvastatin 20 MG tablet Commonly known as: CRESTOR TAKE 1 TABLET BY MOUTH EVERY DAY   Semaglutide(0.25 or 0.5MG/DOS) 2  MG/3ML Sopn Inject 0.25 mg into the skin once a week for 28 days, THEN 0.5 mg once a week. Start taking on: December 21, 2021   Vitamin D (Ergocalciferol) 1.25 MG (50000 UNIT) Caps capsule Commonly known as: DRISDOL Take 1 capsule (50,000 Units total) by mouth every 7 (seven) days.         ALLERGIES: No Known Allergies   REVIEW OF SYSTEMS: A comprehensive ROS was conducted with the patient and is negative except as per HPI and below:  ROS    OBJECTIVE:   VITAL SIGNS: There were no vitals taken for this visit.   PHYSICAL EXAM:  General: Pt appears well and is in NAD  Neck: General: Supple without adenopathy or carotid bruits. Thyroid: Thyroid size normal.  No goiter or nodules appreciated.   Lungs: Clear with good BS bilat with no rales, rhonchi, or wheezes  Heart: RRR   Abdomen:  soft, nontender  Extremities:  Lower extremities - No pretibial edema. No lesions.  Skin: Normal texture and temperature to palpation. No rash noted.  Neuro: MS is good with appropriate affect, pt is alert and Ox3    DM foot exam:    DATA REVIEWED:  Lab Results  Component Value Date   HGBA1C 12.4 (H) 03/25/2022   HGBA1C 12.4 (H) 11/16/2021   HGBA1C 10.0 (H) 07/01/2021   Lab Results  Component Value Date   MICROALBUR 1.0 03/02/2021   LDLCALC 33 03/26/2022   CREATININE 0.56 03/26/2022   Lab Results  Component Value Date   MICRALBCREAT 1.2 03/02/2021    Lab Results  Component Value Date   CHOL 80 03/26/2022   HDL 40 (L) 03/26/2022   LDLCALC 33 03/26/2022   LDLDIRECT 23.0 01/21/2020   TRIG 35 03/26/2022   CHOLHDL 2.0 03/26/2022        ASSESSMENT / PLAN / RECOMMENDATIONS:   1) Type *** Diabetes Mellitus, ***controlled, With*** complications - Most recent A1c of *** %. Goal A1c < *** %.  ***  Plan: GENERAL: ***  MEDICATIONS: ***  EDUCATION / INSTRUCTIONS: BG monitoring instructions: Patient is instructed to check her blood sugars *** times a day, ***. Call [place]  Endocrinology clinic if: BG persistently < 70  I reviewed the Rule of 15 for the treatment of hypoglycemia in detail with the patient. Literature supplied.   2) Diabetic complications:  Eye: Does *** have known diabetic retinopathy.  Neuro/ Feet: Does *** have known diabetic peripheral neuropathy. Renal: Patient does *** have known baseline CKD. She is *** on an ACEI/ARB at present.  3) Lipids: Patient is *** on a statin.    4) Hypertension: ***  at goal of < 140/90 mmHg.  Signed electronically by: Mack Guise, MD  [facility]  [facility] [address] [place], [place] [address] Phone: [phone] FAX: [phone]   CC: Leone Haven, MD [address] [place] [address] Phone: [phone]  Fax: [phone]    Return to Endocrinology clinic as below: Future Appointments  Date Time Provider [place]  03/31/2022  1:20 PM Naquisha Whitehair, Melanie Crazier, MD [facility] None  04/05/2022  3:40 PM Mellody Dance, DO [facility] None  04/14/2022 11:30 AM Caryl Bis Angela Adam, MD [facility]

## 2022-04-02 IMAGING — RF DG UGI W SINGLE CM
11 of 18 series · 12 of 24 positions shown · non-contrast
Comparison: None.

CLINICAL DATA: Preop for gastric sleeve surgery.  Morbid obesity.

EXAM:
UPPER GI SERIES WITH KUB
TECHNIQUE: After obtaining a scout radiograph a routine upper GI series was
performed using thin density barium.
FLUOROSCOPY TIME:  Radiation Exposure Index (if provided by the
fluoroscopic device): 183.4 mGy.

[Series 2: cp_standard · 0.51mm/px · 2 of 92 frames shown (1 of 4)]
[frame 14/92]
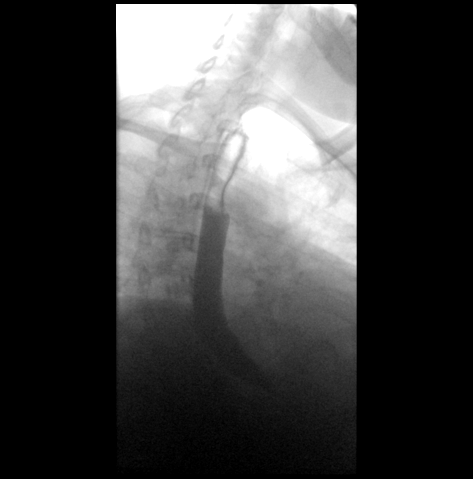
[frame 79/92]
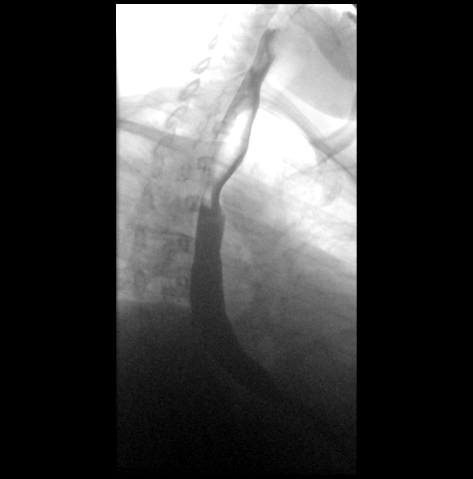

[Series 3: cp_standard · 0.51mm/px · 1 of 141 frames shown (2 of 4)]
[frame 71/141]
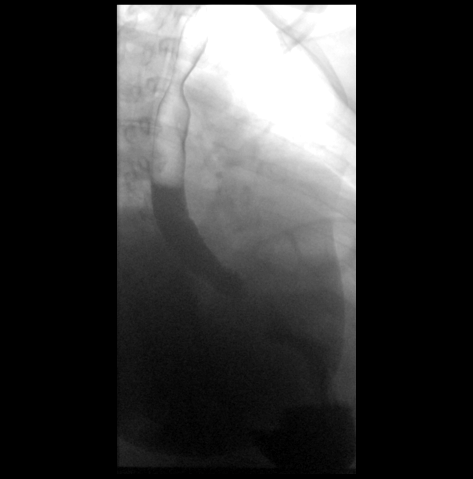

[Series 4: cp_standard · 0.51mm/px · 1 of 192 frames shown (3 of 4)]
[frame 97/192]
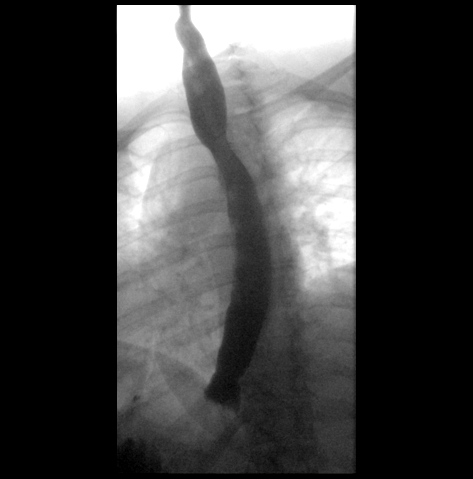

[Series 5: cp_standard · 0.51mm/px · 1 of 207 frames shown (4 of 4)]
[frame 104/207]
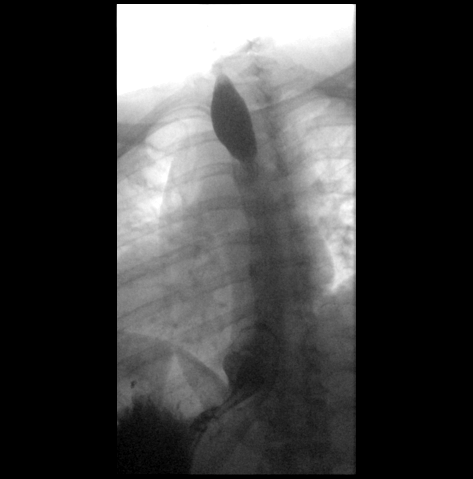

[Series 6: fluoro_barium singleshot_bw · 0.17mm/px · 1 of 1 slices shown (1 of 7)]
[im 1/1]
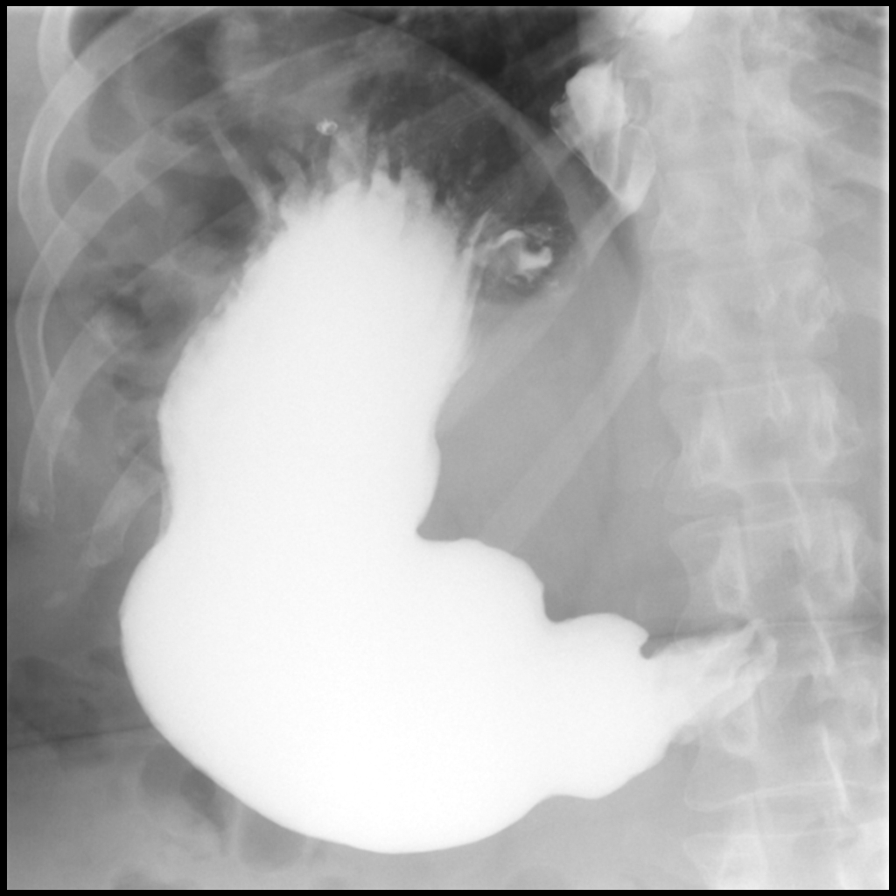

[Series 9: fluoro_barium singleshot_bw · 0.17mm/px · 1 of 1 slices shown (2 of 7)]
[im 1/1]
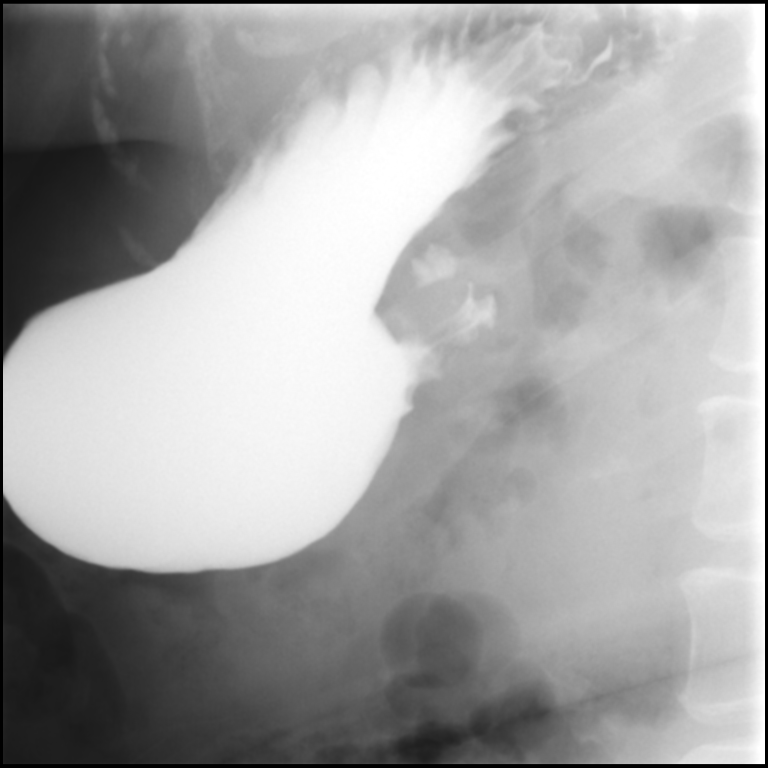

[Series 12: fluoro_barium singleshot_bw · 0.17mm/px · 1 of 1 slices shown (3 of 7)]
[im 1/1]
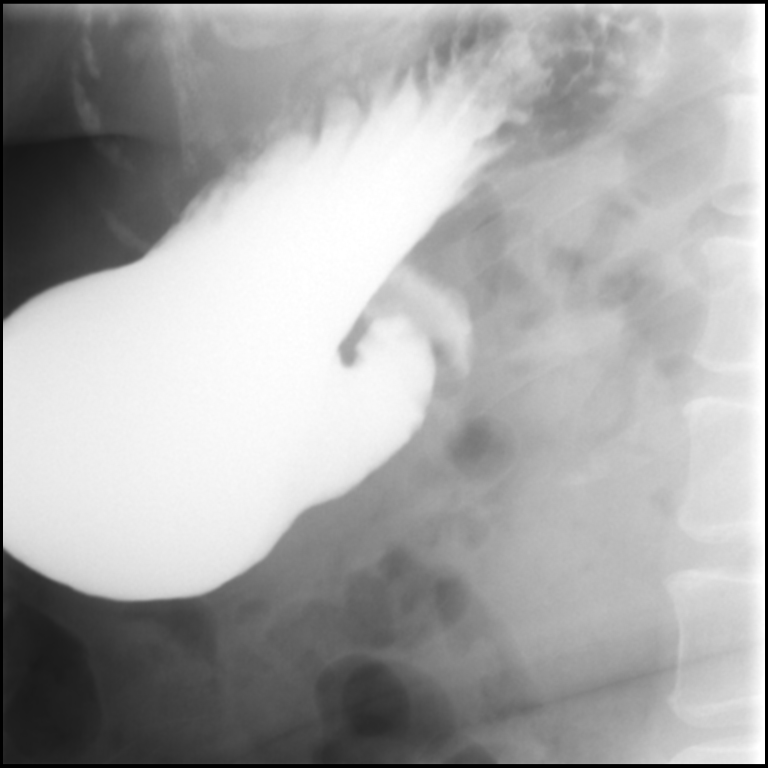

[Series 14: fluoro_barium singleshot_bw · 0.17mm/px · 1 of 1 slices shown (4 of 7)]
[im 1/1]
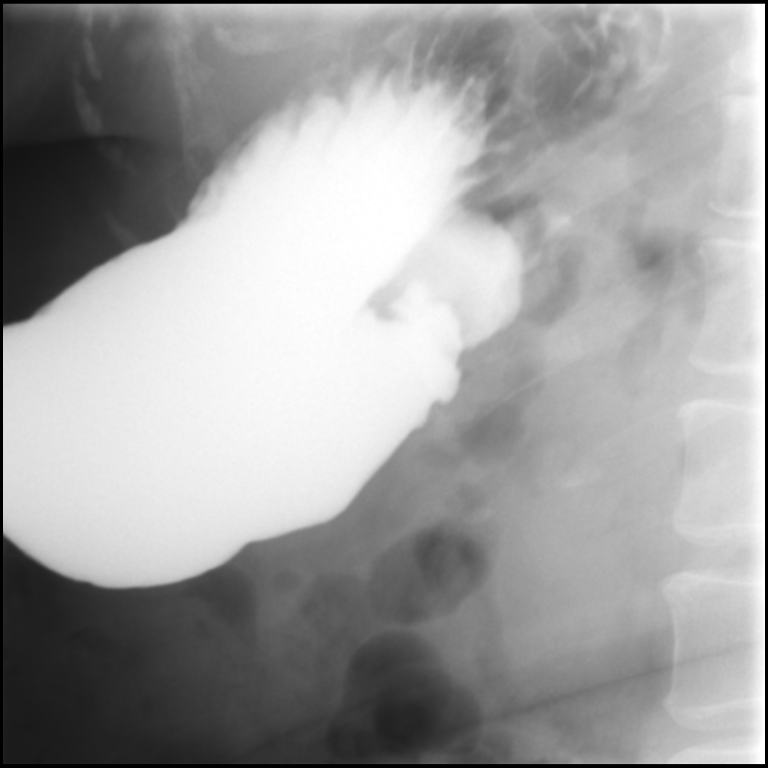

[Series 17: fluoro_barium singleshot_bw · 0.17mm/px · 1 of 1 slices shown (5 of 7)]
[im 1/1]
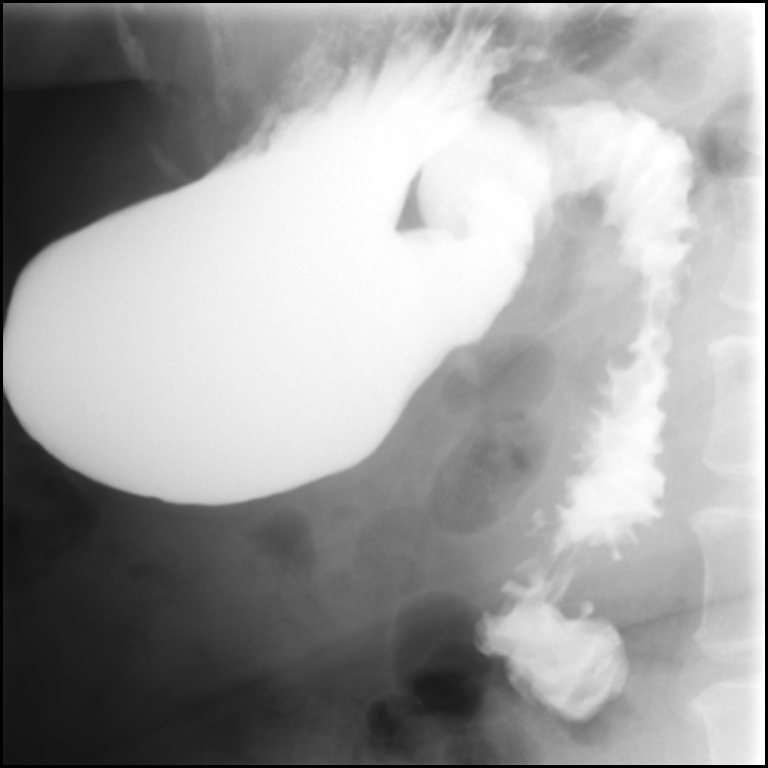

[Series 20: fluoro_barium singleshot_bw · 0.17mm/px · 1 of 1 slices shown (6 of 7)]
[im 1/1]
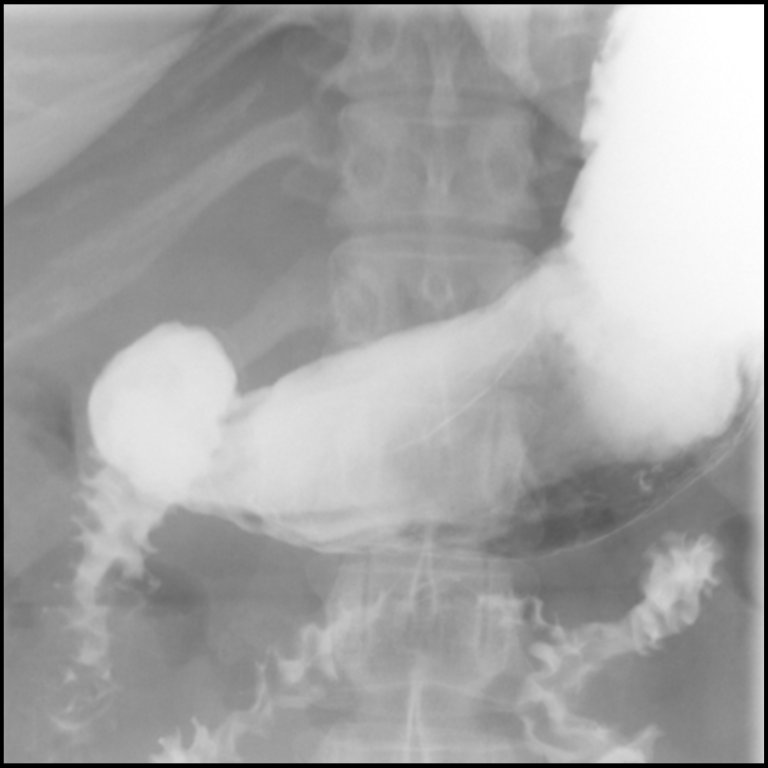

[Series 23: fluoro_barium singleshot_bw · 0.17mm/px · 1 of 1 slices shown (7 of 7)]
[im 1/1]
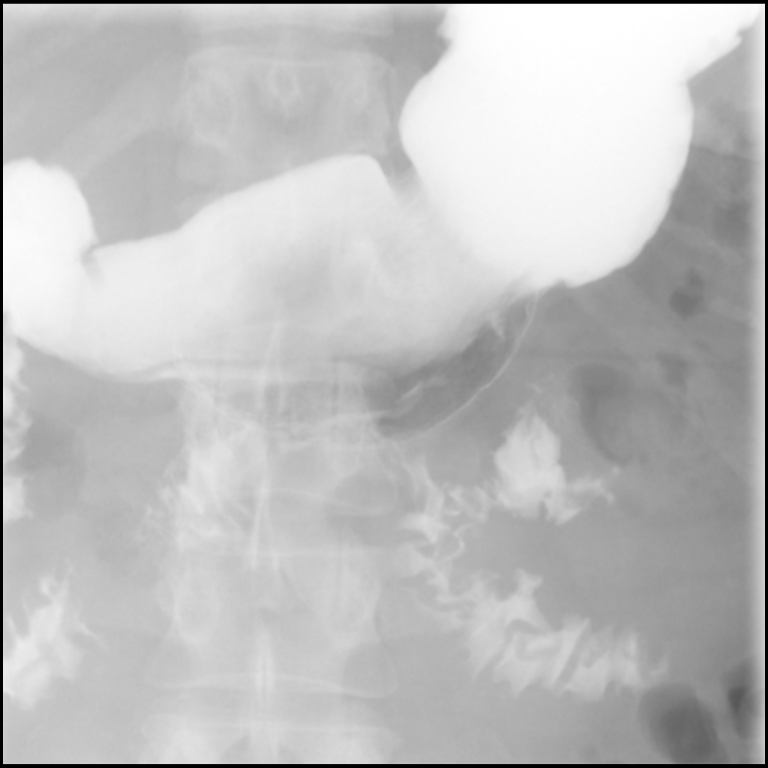

[12 of 24 positions shown; findings below may reference images not displayed]

FINDINGS: No mass or stricture is noted in the esophagus. Small sliding-type
hiatal hernia is noted. No definite reflux is noted. The stomach and
duodenum are unremarkable.
IMPRESSION: Small sliding-type hiatal hernia. No other abnormality seen in the
esophagus, stomach or duodenum.

## 2022-04-05 ENCOUNTER — Ambulatory Visit (INDEPENDENT_AMBULATORY_CARE_PROVIDER_SITE_OTHER): Payer: BC Managed Care – PPO | Admitting: Family Medicine

## 2022-04-07 ENCOUNTER — Encounter: Payer: Self-pay | Admitting: Pharmacist

## 2022-04-08 ENCOUNTER — Other Ambulatory Visit: Payer: Self-pay | Admitting: Family Medicine

## 2022-04-14 ENCOUNTER — Encounter: Payer: Self-pay | Admitting: Family Medicine

## 2022-04-14 ENCOUNTER — Ambulatory Visit: Payer: BC Managed Care – PPO | Admitting: Family Medicine

## 2022-04-14 VITALS — BP 130/70 | HR 91 | Temp 98.5°F | Ht 65.0 in | Wt 273.6 lb

## 2022-04-14 DIAGNOSIS — E1165 Type 2 diabetes mellitus with hyperglycemia: Secondary | ICD-10-CM | POA: Diagnosis not present

## 2022-04-14 DIAGNOSIS — R5383 Other fatigue: Secondary | ICD-10-CM | POA: Diagnosis not present

## 2022-04-14 DIAGNOSIS — E785 Hyperlipidemia, unspecified: Secondary | ICD-10-CM

## 2022-04-14 DIAGNOSIS — E1159 Type 2 diabetes mellitus with other circulatory complications: Secondary | ICD-10-CM

## 2022-04-14 DIAGNOSIS — E1169 Type 2 diabetes mellitus with other specified complication: Secondary | ICD-10-CM

## 2022-04-14 DIAGNOSIS — E119 Type 2 diabetes mellitus without complications: Secondary | ICD-10-CM

## 2022-04-14 DIAGNOSIS — W540XXA Bitten by dog, initial encounter: Secondary | ICD-10-CM

## 2022-04-14 DIAGNOSIS — L089 Local infection of the skin and subcutaneous tissue, unspecified: Secondary | ICD-10-CM

## 2022-04-14 DIAGNOSIS — I152 Hypertension secondary to endocrine disorders: Secondary | ICD-10-CM

## 2022-04-14 DIAGNOSIS — S61451D Open bite of right hand, subsequent encounter: Secondary | ICD-10-CM

## 2022-04-14 DIAGNOSIS — E669 Obesity, unspecified: Secondary | ICD-10-CM

## 2022-04-14 LAB — VITAMIN D 25 HYDROXY (VIT D DEFICIENCY, FRACTURES): VITD: 28.02 ng/mL — ABNORMAL LOW (ref 30.00–100.00)

## 2022-04-14 LAB — VITAMIN B12: Vitamin B-12: 318 pg/mL (ref 211–911)

## 2022-04-14 LAB — TSH: TSH: 1.28 u[IU]/mL (ref 0.35–5.50)

## 2022-04-14 MED ORDER — METFORMIN HCL ER 500 MG PO TB24: 1000.0000 mg | ORAL_TABLET | Freq: Two times a day (BID) | ORAL | 1 refills | Status: DC

## 2022-04-14 MED ORDER — FREESTYLE LIBRE 3 SENSOR MISC: 1.0000 | 11 refills | Status: DC

## 2022-04-14 MED ORDER — SEMAGLUTIDE(0.25 OR 0.5MG/DOS) 2 MG/3ML ~~LOC~~ SOPN: PEN_INJECTOR | SUBCUTANEOUS | 3 refills | Status: DC

## 2022-04-14 NOTE — Progress Notes (Signed)
Tommi Rumps, MD Phone: [phone]  Veronica Norton is a 47 y.o. female who presents today for f/u.  Diabetes: patient is currently taking metformin '500mg'$  twice daily and glipizide '10mg'$  twice daily. She has not started her Ozempic that was prescribed at her last visit because she was concerned about nausea. She became nauseous on the highest dose of [place]. She does have some polyuria and polydipsia. Her LibreView shows that her sugars are very high 30% of the time, high 46% of the time, target range 24% of the time, and low 0% of the time. Typically her sugars are taken around the afternoon. She is up to date with her eye doctor. She has been trying to drink more water and cutting out sweets. She is motivated to work on healthier habits this year.   Hypertension: patient is currently taking hctz 12.'5mg'$  daily. She reports that she does not have a cuff to check pressures with at home but she will obtain one. She denies chest pain, shortness of breath, or new edema.   Hyperlipidemia: patient is taking Crestor '20mg'$  daily. She denies any muscle aches or stomach pain.  Fatigue: patient reports recently having fatigue and "brain fog" which affects her concentration and attention at work. She wonders if she has a vitamin deficiency and plans to order vitamins for herself. She does report feeling stressed recently from her mother with declining memory issues.  Dog bite follow up: patient was bitten by her dog recently and was hospitalized for a small puncture wound that worsened to cellulitis of the ring finger of the right hand. Today she reports no pain from her bite site and reports that she is almost completely back to normal. She is able to have full range of motion in her hand and has no concerns.  Social History   Tobacco Use  Smoking Status Never  Smokeless Tobacco Never    Current Outpatient Medications on File Prior to Visit  Medication Sig Dispense Refill   glipiZIDE  (GLUCOTROL) 10 MG tablet Take 1 tablet (10 mg total) by mouth 2 (two) times daily before a meal. 180 tablet 1   glucose monitoring kit (FREESTYLE) monitoring kit Use as directed to check sugars bid 1 each 0   hydrochlorothiazide (MICROZIDE) 12.5 MG capsule TAKE 1 CAPSULE BY MOUTH EVERY DAY 90 capsule 1   norethindrone (CAMILA) 0.35 MG tablet Take 1 tablet (0.35 mg total) by mouth daily. 28 tablet 6   rosuvastatin (CRESTOR) 20 MG tablet TAKE 1 TABLET BY MOUTH EVERY DAY 90 tablet 3   Vitamin D, Ergocalciferol, (DRISDOL) 1.25 MG (50000 UNIT) CAPS capsule Take 1 capsule (50,000 Units total) by mouth every 7 (seven) days. 12 capsule 0   No current facility-administered medications on file prior to visit.     ROS see history of present illness  Objective  Physical Exam Vitals:   04/14/22 1133 04/14/22 1225  BP: 130/80 130/70  Pulse: 91   Temp: 98.5 F (36.9 C)   SpO2: 99%     BP Readings from Last 3 Encounters:  04/14/22 130/80  03/27/22 130/63  12/21/21 120/70   Wt Readings from Last 3 Encounters:  04/14/22 273 lb 9.6 oz (124.1 kg)  12/21/21 270 lb (122.5 kg)  12/14/21 269 lb (122 kg)    Physical Exam Constitutional:      Appearance: Normal appearance.  HENT:     Head: Normocephalic and atraumatic.  Cardiovascular:     Rate and Rhythm: Normal rate and regular rhythm.  Pulmonary:     Effort: Pulmonary effort is normal.     Breath sounds: Normal breath sounds.  Musculoskeletal:     Comments: Right hand: ring finger is almost completely healed from dog bite. Only some excess dry skin remains.   Skin:    General: Skin is warm and dry.  Neurological:     Mental Status: She is alert.      Assessment/Plan: Please see individual problem list.  Problem List Items Addressed This Visit       Cardiovascular and Mediastinum   Hypertension associated with diabetes ([place]) (Chronic)    Advised patient to start checking pressures at home to assess control. She will let us know  what her blood pressures are at home. Continue HCTZ 12.'5mg'$  daily.      Relevant Medications   metFORMIN (GLUCOPHAGE-XR) 500 MG 24 hr tablet   Semaglutide,0.25 or 0.'5MG'$ /DOS, 2 MG/3ML SOPN     Endocrine   Uncontrolled type 2 diabetes mellitus with hyperglycemia, without long-term current use of insulin (HCC)    Uncontrolled. We will start ozempic 0.25 mg weekly for 4 weeks and then increase to 0.5 mg weekly. Advised patient that she will be starting ozempic on the smallest dose and we will monitor for nausea as she moves up to 0.'5mg'$ . She will continue glipizide 5 mg BID and metformin xr 1000 mg BID. Encouraged healthy diet and exercise.      Relevant Medications   Continuous Blood Gluc Sensor (FREESTYLE LIBRE 3 SENSOR) MISC   metFORMIN (GLUCOPHAGE-XR) 500 MG 24 hr tablet   Semaglutide,0.25 or 0.'5MG'$ /DOS, 2 MG/3ML SOPN   Hyperlipidemia associated with type 2 diabetes mellitus (HCC)    Continue Crestor '20mg'$  daily.      Relevant Medications   metFORMIN (GLUCOPHAGE-XR) 500 MG 24 hr tablet   Semaglutide,0.25 or 0.'5MG'$ /DOS, 2 MG/3ML SOPN     Other   Fatigue - Primary    Could be related to stress or vitamin deficiency. We will check her TSH, vitamin B12, and vitamin D today.       Relevant Orders   Vitamin D (25 hydroxy)   B12   TSH   Dog bite of right hand with infection    Almost completely healed. Patient will let us know if she sees any changes to her hand.      Other Visit Diagnoses     Diabetes mellitus type 2 in obese (HCC)       Relevant Medications   Continuous Blood Gluc Sensor (FREESTYLE LIBRE 3 SENSOR) MISC   metFORMIN (GLUCOPHAGE-XR) 500 MG 24 hr tablet   Semaglutide,0.25 or 0.'5MG'$ /DOS, 2 MG/3ML SOPN   Type 2 diabetes mellitus without complication, without long-term current use of insulin (HCC)       Relevant Medications   metFORMIN (GLUCOPHAGE-XR) 500 MG 24 hr tablet   Semaglutide,0.25 or 0.'5MG'$ /DOS, 2 MG/3ML SOPN         Return in about 3 months (around  07/13/2022) for DM.   Marisa Cyphers, Medical Student [place]

## 2022-04-14 NOTE — Assessment & Plan Note (Signed)
Continue Crestor 20 mg daily. 

## 2022-04-14 NOTE — Assessment & Plan Note (Signed)
Advised patient to start checking pressures at home to assess control. She will let us know what her blood pressures are at home. Continue HCTZ 12.'5mg'$  daily.

## 2022-04-14 NOTE — Assessment & Plan Note (Signed)
Could be related to stress or vitamin deficiency. We will check her TSH, vitamin B12, and vitamin D today.

## 2022-04-14 NOTE — Patient Instructions (Signed)
Nice to see you. Please start the [place].  If you have excessive nausea with this please let us know. We will contact you with your lab results.

## 2022-04-14 NOTE — Assessment & Plan Note (Signed)
Almost completely healed. Patient will let us know if she sees any changes to her hand.

## 2022-04-14 NOTE — Progress Notes (Signed)
Patient seen along with medical student Zada Zhong.  I personally evaluated this patient along with the student, and verified all aspects of the history, physical exam, and medical decision making as documented by the student.  I agree with the student's documentation and have made all necessary edits.  Leimomi Zervas, MD  

## 2022-04-14 NOTE — Assessment & Plan Note (Signed)
Uncontrolled. We will start ozempic 0.25 mg weekly for 4 weeks and then increase to 0.5 mg weekly. Advised patient that she will be starting ozempic on the smallest dose and we will monitor for nausea as she moves up to 0.'5mg'$ . She will continue glipizide 5 mg BID and metformin xr 1000 mg BID. Encouraged healthy diet and exercise.

## 2022-04-18 ENCOUNTER — Other Ambulatory Visit: Payer: Self-pay

## 2022-04-18 ENCOUNTER — Ambulatory Visit (INDEPENDENT_AMBULATORY_CARE_PROVIDER_SITE_OTHER): Payer: BC Managed Care – PPO

## 2022-04-18 DIAGNOSIS — E559 Vitamin D deficiency, unspecified: Secondary | ICD-10-CM

## 2022-04-18 DIAGNOSIS — Z111 Encounter for screening for respiratory tuberculosis: Secondary | ICD-10-CM | POA: Diagnosis not present

## 2022-04-18 DIAGNOSIS — R7989 Other specified abnormal findings of blood chemistry: Secondary | ICD-10-CM

## 2022-04-20 ENCOUNTER — Ambulatory Visit: Payer: BC Managed Care – PPO

## 2022-04-20 LAB — TB SKIN TEST
Induration: 0 mm
TB Skin Test: NEGATIVE

## 2022-04-20 NOTE — Progress Notes (Signed)
PPD Reading Note  PPD read and results entered in EpicCare.  Result: 0 mm induration.  Interpretation: Negative  If test not read within 48-72 hours of initial placement, patient advised to repeat in other arm 1-3 weeks after this test.  Allergic reaction: no

## 2022-04-27 ENCOUNTER — Ambulatory Visit (INDEPENDENT_AMBULATORY_CARE_PROVIDER_SITE_OTHER): Payer: BC Managed Care – PPO | Admitting: Family Medicine

## 2022-05-01 ENCOUNTER — Other Ambulatory Visit (INDEPENDENT_AMBULATORY_CARE_PROVIDER_SITE_OTHER): Payer: Self-pay | Admitting: Family Medicine

## 2022-05-01 ENCOUNTER — Ambulatory Visit (INDEPENDENT_AMBULATORY_CARE_PROVIDER_SITE_OTHER): Payer: BC Managed Care – PPO | Admitting: Family Medicine

## 2022-05-01 ENCOUNTER — Encounter (INDEPENDENT_AMBULATORY_CARE_PROVIDER_SITE_OTHER): Payer: Self-pay

## 2022-05-01 DIAGNOSIS — E559 Vitamin D deficiency, unspecified: Secondary | ICD-10-CM

## 2022-05-16 ENCOUNTER — Encounter (INDEPENDENT_AMBULATORY_CARE_PROVIDER_SITE_OTHER): Payer: Self-pay | Admitting: Family Medicine

## 2022-05-16 ENCOUNTER — Ambulatory Visit (INDEPENDENT_AMBULATORY_CARE_PROVIDER_SITE_OTHER): Payer: BC Managed Care – PPO | Admitting: Family Medicine

## 2022-05-16 VITALS — BP 119/84 | HR 94 | Temp 97.7°F | Ht 65.0 in | Wt 268.0 lb

## 2022-05-16 DIAGNOSIS — E1159 Type 2 diabetes mellitus with other circulatory complications: Secondary | ICD-10-CM

## 2022-05-16 DIAGNOSIS — E1165 Type 2 diabetes mellitus with hyperglycemia: Secondary | ICD-10-CM | POA: Diagnosis not present

## 2022-05-16 DIAGNOSIS — E669 Obesity, unspecified: Secondary | ICD-10-CM | POA: Diagnosis not present

## 2022-05-16 DIAGNOSIS — Z6841 Body Mass Index (BMI) 40.0 and over, adult: Secondary | ICD-10-CM

## 2022-05-16 DIAGNOSIS — Z7984 Long term (current) use of oral hypoglycemic drugs: Secondary | ICD-10-CM

## 2022-05-16 DIAGNOSIS — I152 Hypertension secondary to endocrine disorders: Secondary | ICD-10-CM

## 2022-05-16 MED ORDER — TIRZEPATIDE 2.5 MG/0.5ML ~~LOC~~ SOAJ: 2.5000 mg | SUBCUTANEOUS | 0 refills | Status: DC

## 2022-05-16 NOTE — Progress Notes (Deleted)
Patients first appointment since October.  Previously saw Dr. Juleen China and then Dr. Raliegh Scarlet.  Has had some complications recently.  Patient feels like she does well eating protein, vegetable and a starch.  She has cut out sugar sweetened beverages and is drinking water only.  Thinks easiest meal plan will be keeping track of food in her phone.

## 2022-05-24 NOTE — Progress Notes (Signed)
Chief Complaint:   OBESITY Veronica Norton is here to discuss her progress with her obesity treatment plan along with follow-up of her obesity related diagnoses. Veronica Norton is on the Category 2 Plan and states she is following her eating plan approximately 30% of the time. [name] states she is walking 2-3 times per week.  Today's visit was #: 46 Starting weight: 284 lbs Starting date: 02/03/2019 Today's weight: 268 lbs Today's date: 05/16/2022 Total lbs lost to date: 10 LBS Total lbs lost since last in-office visit: 1 LB  Interim History:  Patients first appointment since October.  Previously saw Dr. Juleen China and then Dr. Raliegh Scarlet.  Has had some complications recently.  Patient feels like she does well eating protein, vegetable and a starch.  She has cut out sugar sweetened beverages and is drinking water only.  Thinks easiest meal plan will be keeping track of food in her phone.   Subjective:   1. 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  Patient's last A1c was >12.  2. Hypertension associated with diabetes ([place]) Patient's blood pressure is controlled today.  Patient denies chest pain, chest pressure, headache.  Assessment/Plan:   1. Uncontrolled type 2 diabetes mellitus with hyperglycemia, without long-term current use of insulin ([place]) Ozempic and start Mounjaro.  Follow-up with A1c in 2 months.  Start- tirzepatide [facility]) 2.5 MG/0.5ML Pen; Inject 2.5 mg into the skin once a week.  Dispense: 2 mL; Refill: 0  2. Hypertension associated with diabetes ([place]) Follow-up with blood pressure next appointment.  3. BMI 40.0-44.9, adult ([place])  4. Obesity with starting BMI of 47.2 Veronica Norton is currently in the action stage of change. As such, her goal is to continue with weight loss efforts. She has agreed to keeping a food journal and adhering to recommended goals of  1350-1500 calories and 100+ protein daily.    Exercise goals: No exercise has been prescribed at this time.  Behavioral modification strategies: increasing lean protein intake, meal planning and cooking strategies, keeping healthy foods in the home, and planning for success.  [name] has agreed to follow-up with our clinic in 3 weeks. She was informed of the importance of frequent follow-up visits to maximize her success with intensive lifestyle modifications for her multiple health conditions.   Objective:   Blood pressure 119/84, pulse 94, temperature 97.7 F (36.5 C), height '5\' 5"'$  (1.651 m), weight 268 lb (121.6 kg), last menstrual period 04/26/2022, SpO2 97 %. Body mass index is 44.6 kg/m.  General: Cooperative, alert, well developed, in no acute distress. HEENT: Conjunctivae and lids unremarkable. Cardiovascular: Regular rhythm.  Lungs: Normal work of breathing. Neurologic: No focal deficits.   Lab Results  Component Value Date   CREATININE 0.56 03/26/2022   BUN 10 03/26/2022   NA 134 (L) 03/26/2022   K 3.6 03/26/2022   CL 100 03/26/2022   CO2 26 03/26/2022   Lab Results  Component Value Date   ALT 20 11/16/2021   AST 14 11/16/2021   ALKPHOS 85 11/16/2021   BILITOT 0.3 11/16/2021   Lab Results  Component Value Date   HGBA1C 12.4 (H) 03/25/2022   HGBA1C 12.4 (H) 11/16/2021   HGBA1C 10.0 (H) 07/01/2021   HGBA1C 10.7 (H) 03/02/2021   HGBA1C 8.9 (A) 11/24/2020   Lab Results  Component Value Date   INSULIN 11.9 11/16/2021   Lab Results  Component Value Date  TSH 1.28 04/14/2022   Lab Results  Component Value Date   CHOL 80 03/26/2022   HDL 40 (L) 03/26/2022   LDLCALC 33 03/26/2022   LDLDIRECT 23.0 01/21/2020   TRIG 35 03/26/2022   CHOLHDL 2.0 03/26/2022   Lab Results  Component Value Date   VD25OH 28.02 (L) 04/14/2022   VD25OH 30.2 11/16/2021   VD25OH 22.62 (L) 05/07/2020   Lab Results  Component Value Date   WBC 7.7 03/26/2022   HGB 13.2  03/26/2022   HCT 39.2 03/26/2022   MCV 94.7 03/26/2022   PLT 340 03/26/2022   No results found for: "IRON", "TIBC", "FERRITIN"  Attestation Statements:   Reviewed by clinician on day of visit: allergies, medications, problem list, medical history, surgical history, family history, social history, and previous encounter notes.  I, Davy Pique, RMA, am acting as transcriptionist for Coralie Common, MD.  I have reviewed the above documentation for accuracy and completeness, and I agree with the above. - Coralie Common, MD

## 2022-05-30 ENCOUNTER — Encounter (INDEPENDENT_AMBULATORY_CARE_PROVIDER_SITE_OTHER): Payer: Self-pay | Admitting: Family Medicine

## 2022-05-30 ENCOUNTER — Ambulatory Visit (INDEPENDENT_AMBULATORY_CARE_PROVIDER_SITE_OTHER): Payer: BC Managed Care – PPO | Admitting: Family Medicine

## 2022-05-30 VITALS — BP 142/79 | HR 91 | Temp 98.3°F | Ht 65.0 in | Wt 269.0 lb

## 2022-05-30 DIAGNOSIS — E1165 Type 2 diabetes mellitus with hyperglycemia: Secondary | ICD-10-CM

## 2022-05-30 DIAGNOSIS — E669 Obesity, unspecified: Secondary | ICD-10-CM

## 2022-05-30 DIAGNOSIS — E1169 Type 2 diabetes mellitus with other specified complication: Secondary | ICD-10-CM

## 2022-05-30 DIAGNOSIS — E785 Hyperlipidemia, unspecified: Secondary | ICD-10-CM | POA: Diagnosis not present

## 2022-05-30 DIAGNOSIS — Z7985 Long-term (current) use of injectable non-insulin antidiabetic drugs: Secondary | ICD-10-CM

## 2022-05-30 DIAGNOSIS — Z6841 Body Mass Index (BMI) 40.0 and over, adult: Secondary | ICD-10-CM

## 2022-05-30 MED ORDER — ROSUVASTATIN CALCIUM 20 MG PO TABS: 20.0000 mg | ORAL_TABLET | Freq: Every day | ORAL | 0 refills | Status: DC

## 2022-05-30 NOTE — Progress Notes (Signed)
Chief Complaint:   OBESITY Veronica Norton is here to discuss her progress with her obesity treatment plan along with follow-up of her obesity related diagnoses. Veronica Norton is on keeping a food journal and adhering to recommended goals of 1350-1500 calories and 190 protein and states she is following her eating plan approximately 50% of the time. Veronica Norton states she is walking for 30 to 45 minutes 2-3 times per week.  Today's visit was #: 49 Starting weight: 53 LBS Starting date: 02/03/2019 Today's weight: 268 LBS Today's date: 05/30/2022 Total lbs lost to date: 16 LBS Total lbs lost since last in-office visit: +1 LB  Interim History: Patient didn't journal as much as she would have liked but what she did journal she realizes that she may have some changes to make to stay within calorie goal and hit protein amount.  Thinks that making sure she eats breakfast, lunch and dinner would be helpful in hitting her goals. Some of the restaurants she does go to in [place] are a mom and pop place so nutrition isn't always readily available.  Subjective:   1. Hyperlipidemia associated with type 2 diabetes mellitus ([place]) Patient is on Crestor.  No side effects noted.  2. Type 2 diabetes mellitus with hyperglycemia, without long-term current use of insulin ([place]) Mounjaro is not in stock, but now available.  Assessment/Plan:   1. Hyperlipidemia associated with type 2 diabetes mellitus (HCC) Refill- rosuvastatin (CRESTOR) 20 MG tablet; Take 1 tablet (20 mg total) by mouth daily.  Dispense: 90 tablet; Refill: 0  2. Type 2 diabetes mellitus with hyperglycemia, without long-term current use of insulin (HCC) Start Mounjaro, follow-up in the next appointment for side effects.  3. BMI 40.0-44.9, adult (Veronica Norton)  4. Obesity with starting BMI of 47.2 Veronica Norton is currently in the action stage of change. As such, her goal is to continue with weight loss efforts. She has agreed to keeping a food journal and adhering to  recommended goals of 1350-1500 calories and 100+ protein daily.   Exercise goals: All adults should avoid inactivity. Some physical activity is better than none, and adults who participate in any amount of physical activity gain some health benefits.  Behavioral modification strategies: increasing lean protein intake, meal planning and cooking strategies, keeping healthy foods in the home, and planning for success.  Veronica Norton has agreed to follow-up with our clinic in 3 weeks. She was informed of the importance of frequent follow-up visits to maximize her success with intensive lifestyle modifications for her multiple health conditions.   Objective:   Blood pressure (!) 142/79, pulse 91, temperature 98.3 F (36.8 C), height 5\' 5"  (1.651 m), weight 269 lb (122 kg), last menstrual period 04/26/2022, SpO2 98 %. Body mass index is 44.76 kg/m.  General: Cooperative, alert, well developed, in no acute distress. HEENT: Conjunctivae and lids unremarkable. Cardiovascular: Regular rhythm.  Lungs: Normal work of breathing. Neurologic: No focal deficits.   Lab Results  Component Value Date   CREATININE 0.56 03/26/2022   BUN 10 03/26/2022   NA 134 (L) 03/26/2022   K 3.6 03/26/2022   CL 100 03/26/2022   CO2 26 03/26/2022   Lab Results  Component Value Date   ALT 20 11/16/2021   AST 14 11/16/2021   ALKPHOS 85 11/16/2021   BILITOT 0.3 11/16/2021   Lab Results  Component Value Date   HGBA1C 12.4 (H) 03/25/2022   HGBA1C 12.4 (H) 11/16/2021   HGBA1C 10.0 (H) 07/01/2021   HGBA1C 10.7 (H) 03/02/2021  HGBA1C 8.9 (A) 11/24/2020   Lab Results  Component Value Date   INSULIN 11.9 11/16/2021   Lab Results  Component Value Date   TSH 1.28 04/14/2022   Lab Results  Component Value Date   CHOL 80 03/26/2022   HDL 40 (L) 03/26/2022   LDLCALC 33 03/26/2022   LDLDIRECT 23.0 01/21/2020   TRIG 35 03/26/2022   CHOLHDL 2.0 03/26/2022   Lab Results  Component Value Date   VD25OH 28.02 (L)  04/14/2022   VD25OH 30.2 11/16/2021   VD25OH 22.62 (L) 05/07/2020   Lab Results  Component Value Date   WBC 7.7 03/26/2022   HGB 13.2 03/26/2022   HCT 39.2 03/26/2022   MCV 94.7 03/26/2022   PLT 340 03/26/2022   No results found for: "IRON", "TIBC", "FERRITIN"  Attestation Statements:   Reviewed by clinician on day of visit: allergies, medications, problem list, medical history, surgical history, family history, social history, and previous encounter notes.  I, Davy Pique, RMA, am acting as transcriptionist for Coralie Common, MD.  I have reviewed the above documentation for accuracy and completeness, and I agree with the above. - Coralie Common, MD

## 2022-06-29 ENCOUNTER — Ambulatory Visit (INDEPENDENT_AMBULATORY_CARE_PROVIDER_SITE_OTHER): Payer: BC Managed Care – PPO | Admitting: Family Medicine

## 2022-06-29 ENCOUNTER — Encounter (INDEPENDENT_AMBULATORY_CARE_PROVIDER_SITE_OTHER): Payer: Self-pay | Admitting: Family Medicine

## 2022-06-29 VITALS — BP 132/86 | HR 87 | Temp 98.3°F | Ht 65.0 in | Wt 268.0 lb

## 2022-06-29 DIAGNOSIS — E669 Obesity, unspecified: Secondary | ICD-10-CM

## 2022-06-29 DIAGNOSIS — E559 Vitamin D deficiency, unspecified: Secondary | ICD-10-CM | POA: Diagnosis not present

## 2022-06-29 DIAGNOSIS — E785 Hyperlipidemia, unspecified: Secondary | ICD-10-CM | POA: Diagnosis not present

## 2022-06-29 DIAGNOSIS — E1165 Type 2 diabetes mellitus with hyperglycemia: Secondary | ICD-10-CM

## 2022-06-29 DIAGNOSIS — Z6841 Body Mass Index (BMI) 40.0 and over, adult: Secondary | ICD-10-CM

## 2022-06-29 DIAGNOSIS — E1169 Type 2 diabetes mellitus with other specified complication: Secondary | ICD-10-CM

## 2022-06-29 DIAGNOSIS — Z7985 Long-term (current) use of injectable non-insulin antidiabetic drugs: Secondary | ICD-10-CM

## 2022-06-29 MED ORDER — GLIPIZIDE 10 MG PO TABS: 10.0000 mg | ORAL_TABLET | Freq: Every day | ORAL | 0 refills | Status: DC

## 2022-06-29 NOTE — Progress Notes (Signed)
Chief Complaint:   OBESITY Veronica Norton is here to discuss her progress with her obesity treatment plan along with follow-up of her obesity related diagnoses. Veronica Norton is on keeping a food journal and adhering to recommended goals of 1350-1500 calories and 100+ protein and states she is following her eating plan approximately 0% of the time. Veronica Norton states she is walking..  Today's visit was #: 42 Starting weight: 284 LBS Starting date: 02/03/2019 Today's weight: 268 LBS Today's date: 06/29/2022 Total lbs lost to date: 16 LBS Total lbs lost since last in-office visit: 1 LB  Interim History: Patient voices she has not been journaling.  She is trying to eat more protein daily.  She is trying to be mindful of increasing protein amount and keeping animal protein lean.  Patient has not started mounjaro yet.  She is worried about side effects.  No plans for the next month except mother's day.  Subjective:   1. Type 2 diabetes mellitus with hyperglycemia, without long-term current use of insulin Patient last A1c was 12.1.  Patient had not started [facility] yet.  2. Hyperlipidemia associated with type 2 diabetes mellitus Patient is on Crestor, no side effects noted.  3. Vitamin D deficiency Patient does not OTC vitamin D.  Patient last vitamin D level in February 2024.  Assessment/Plan:   1. Type 2 diabetes mellitus with hyperglycemia, without long-term current use of insulin Decrease glipizide to 1 pill daily.  Start Mounjaro.  Check labs today.  - Comprehensive metabolic panel - Hemoglobin A1c - Insulin, random  2. Hyperlipidemia associated with type 2 diabetes mellitus Continue Crestor.  No change in medication or dose.  Check labs today.  - Lipid Panel With LDL/HDL Ratio  3. Vitamin D deficiency Check labs today.  - VITAMIN D 25 Hydroxy (Vit-D Deficiency, Fractures)  4. BMI 40.0-44.9, adult  5. Obesity with starting BMI of 47.2 Veronica Norton is currently in the action stage of change.  As such, her goal is to continue with weight loss efforts. She has agreed to keeping a food journal and adhering to recommended goals of 1350-1500 calories and 100+ protein.   Exercise goals: All adults should avoid inactivity. Some physical activity is better than none, and adults who participate in any amount of physical activity gain some health benefits.  Behavioral modification strategies: increasing lean protein intake, meal planning and cooking strategies, keeping healthy foods in the home, and planning for success.  Veronica Norton has agreed to follow-up with our clinic in 3 weeks. She was informed of the importance of frequent follow-up visits to maximize her success with intensive lifestyle modifications for her multiple health conditions.   Veronica Norton was informed we would discuss her lab results at her next visit unless there is a critical issue that needs to be addressed sooner. Veronica Norton agreed to keep her next visit at the agreed upon time to discuss these results.  Objective:   Blood pressure 132/86, pulse 87, temperature 98.3 F (36.8 C), height  (1.651 m), weight 268 lb (121.6 kg), SpO2 98 %. Body mass index is 44.6 kg/m.  General: Cooperative, alert, well developed, in no acute distress. HEENT: Conjunctivae and lids unremarkable. Cardiovascular: Regular rhythm.  Lungs: Normal work of breathing. Neurologic: No focal deficits.   Lab Results  Component Value Date   CREATININE 0.56 03/26/2022   BUN 10 03/26/2022   NA 134 (L) 03/26/2022   K 3.6 03/26/2022   CL 100 03/26/2022   CO2 26 03/26/2022   Lab Results  Component Value Date   ALT 20 11/16/2021   AST 14 11/16/2021   ALKPHOS 85 11/16/2021   BILITOT 0.3 11/16/2021   Lab Results  Component Value Date   HGBA1C 12.4 (H) 03/25/2022   HGBA1C 12.4 (H) 11/16/2021   HGBA1C 10.0 (H) 07/01/2021   HGBA1C 10.7 (H) 03/02/2021   HGBA1C 8.9 (A) 11/24/2020   Lab Results  Component Value Date   INSULIN 11.9 11/16/2021   Lab  Results  Component Value Date   TSH 1.28 04/14/2022   Lab Results  Component Value Date   CHOL 80 03/26/2022   HDL 40 (L) 03/26/2022   LDLCALC 33 03/26/2022   LDLDIRECT 23.0 01/21/2020   TRIG 35 03/26/2022   CHOLHDL 2.0 03/26/2022   Lab Results  Component Value Date   VD25OH 28.02 (L) 04/14/2022   VD25OH 30.2 11/16/2021   VD25OH 22.62 (L) 05/07/2020   Lab Results  Component Value Date   WBC 7.7 03/26/2022   HGB 13.2 03/26/2022   HCT 39.2 03/26/2022   MCV 94.7 03/26/2022   PLT 340 03/26/2022   No results found for: "IRON", "TIBC", "FERRITIN"  Attestation Statements:   Reviewed by clinician on day of visit: allergies, medications, problem list, medical history, surgical history, family history, social history, and previous encounter notes.  I, Malcolm Metro, RMA, am acting as transcriptionist for Reuben Likes, MD.  I have reviewed the above documentation for accuracy and completeness, and I agree with the above. - Reuben Likes, MD

## 2022-07-13 ENCOUNTER — Other Ambulatory Visit (INDEPENDENT_AMBULATORY_CARE_PROVIDER_SITE_OTHER): Payer: BC Managed Care – PPO

## 2022-07-13 DIAGNOSIS — E559 Vitamin D deficiency, unspecified: Secondary | ICD-10-CM | POA: Diagnosis not present

## 2022-07-13 DIAGNOSIS — R7989 Other specified abnormal findings of blood chemistry: Secondary | ICD-10-CM

## 2022-07-13 LAB — VITAMIN B12: Vitamin B-12: 1006 pg/mL — ABNORMAL HIGH (ref 211–911)

## 2022-07-14 ENCOUNTER — Ambulatory Visit: Payer: BC Managed Care – PPO | Admitting: Family Medicine

## 2022-07-14 ENCOUNTER — Encounter: Payer: Self-pay | Admitting: Family Medicine

## 2022-07-14 VITALS — BP 124/78 | HR 86 | Temp 98.0°F | Ht 65.0 in | Wt 273.2 lb

## 2022-07-14 DIAGNOSIS — I152 Hypertension secondary to endocrine disorders: Secondary | ICD-10-CM

## 2022-07-14 DIAGNOSIS — E1159 Type 2 diabetes mellitus with other circulatory complications: Secondary | ICD-10-CM

## 2022-07-14 DIAGNOSIS — E1165 Type 2 diabetes mellitus with hyperglycemia: Secondary | ICD-10-CM

## 2022-07-14 DIAGNOSIS — Z7985 Long-term (current) use of injectable non-insulin antidiabetic drugs: Secondary | ICD-10-CM

## 2022-07-14 DIAGNOSIS — R232 Flushing: Secondary | ICD-10-CM

## 2022-07-14 DIAGNOSIS — Z7984 Long term (current) use of oral hypoglycemic drugs: Secondary | ICD-10-CM

## 2022-07-14 LAB — POCT GLYCOSYLATED HEMOGLOBIN (HGB A1C): Hemoglobin A1C: 11 % — AB (ref 4.0–5.6)

## 2022-07-14 MED ORDER — TIRZEPATIDE 5 MG/0.5ML ~~LOC~~ SOAJ: 5.0000 mg | SUBCUTANEOUS | 1 refills | Status: DC

## 2022-07-14 MED ORDER — TIRZEPATIDE 2.5 MG/0.5ML ~~LOC~~ SOAJ: 2.5000 mg | SUBCUTANEOUS | 0 refills | Status: AC

## 2022-07-14 NOTE — Assessment & Plan Note (Signed)
Patient is likely perimenopausal.  Recent TSH was in the normal range.  Discussed there are medications we could try though we opted against that given the relatively minor symptoms she is currently having.

## 2022-07-14 NOTE — Progress Notes (Signed)
Marikay Alar, MD Phone: [phone]  Veronica Norton is a 47 y.o. female who presents today for f/u.  DIABETES Disease Monitoring: Blood Sugar ranges-notes it was always high when she was using the CGM so she stopped using it.  Polyuria/phagia/dipsia- notes polydipsia most of the time.       Optho- UTD Medications: Compliance- taking glipizide, metformin, has not started mounjaro Hypoglycemic symptoms- no  HYPERTENSION Disease Monitoring Home BP Monitoring not checking Chest pain- no    Dyspnea- no Medications Compliance-  taking HCTZ.  Edema- no BMET    Component Value Date/Time   NA 134 (L) 03/26/2022 0420   NA 135 11/16/2021 0845   K 3.6 03/26/2022 0420   CL 100 03/26/2022 0420   CO2 26 03/26/2022 0420   GLUCOSE 206 (H) 03/26/2022 0420   BUN 10 03/26/2022 0420   BUN 14 11/16/2021 0845   CREATININE 0.56 03/26/2022 0420   CREATININE 0.67 02/06/2018 1506   CALCIUM 8.7 (L) 03/26/2022 0420   GFRNONAA >60 03/26/2022 0420   GFRAA 117 08/18/2019 1712   Hot flashes: Patient notes these have been going on intermittently just at night for some time now.  She will get hot and cold.  No significant symptoms during the day.  No significant sweating with this.  She still has a menstrual cycle monthly.   Social History   Tobacco Use  Smoking Status Never  Smokeless Tobacco Never    Current Outpatient Medications on File Prior to Visit  Medication Sig Dispense Refill   Continuous Blood Gluc Sensor (FREESTYLE LIBRE 3 SENSOR) MISC Apply 1 each topically every 14 (fourteen) days. Place 1 sensor on the skin every 14 days. Use to check glucose continuously 2 each 11   glipiZIDE (GLUCOTROL) 10 MG tablet Take 1 tablet (10 mg total) by mouth daily before breakfast. 90 tablet 0   hydrochlorothiazide (MICROZIDE) 12.5 MG capsule TAKE 1 CAPSULE BY MOUTH EVERY DAY 90 capsule 1   metFORMIN (GLUCOPHAGE-XR) 500 MG 24 hr tablet Take 2 tablets (1,000 mg total) by mouth 2 (two) times daily.  360 tablet 1   rosuvastatin (CRESTOR) 20 MG tablet Take 1 tablet (20 mg total) by mouth daily. 90 tablet 0   VITAMIN D, CHOLECALCIFEROL, PO Take by mouth.     No current facility-administered medications on file prior to visit.     ROS see history of present illness  Objective  Physical Exam Vitals:   07/14/22 1429  BP: 124/78  Pulse: 86  Temp: 98 F (36.7 C)  SpO2: 96%    BP Readings from Last 3 Encounters:  07/14/22 124/78  06/29/22 132/86  05/30/22 (!) 142/79   Wt Readings from Last 3 Encounters:  07/14/22 273 lb 3.2 oz (123.9 kg)  06/29/22 268 lb (121.6 kg)  05/30/22 269 lb (122 kg)    Physical Exam Constitutional:      General: She is not in acute distress.    Appearance: She is not diaphoretic.  Cardiovascular:     Rate and Rhythm: Normal rate and regular rhythm.     Heart sounds: Normal heart sounds.  Pulmonary:     Effort: Pulmonary effort is normal.     Breath sounds: Normal breath sounds.  Skin:    General: Skin is warm and dry.  Neurological:     Mental Status: She is alert.      Assessment/Plan: Please see individual problem list.  Uncontrolled type 2 diabetes mellitus with hyperglycemia, without long-term current use of insulin (HCC)  Assessment & Plan: Chronic issue.  Uncontrolled.  Has improved slightly though.  She will start Mounjaro 2.5 mg weekly and increase to 5 mg after 4 weeks.  She will continue glipizide 10 mg daily.  She will continue metformin 1000 mg twice daily.  Encouraged diet and exercise.  Orders: -     POCT glycosylated hemoglobin (Hb A1C) -     Microalbumin / creatinine urine ratio -     Tirzepatide; Inject 2.5 mg into the skin once a week for 28 days.  Dispense: 2 mL; Refill: 0 -     Tirzepatide; Inject 5 mg into the skin once a week. Start after completing the 28 days of the 2.5 mg dose.  Dispense: 6 mL; Refill: 1  Hypertension associated with diabetes (HCC) Assessment & Plan: Chronic issue.  Adequately controlled.   She will continue HCTZ 12.5 mg daily.   Hot flashes Assessment & Plan: Patient is likely perimenopausal.  Recent TSH was in the normal range.  Discussed there are medications we could try though we opted against that given the relatively minor symptoms she is currently having.      Return in about 3 months (around 10/14/2022) for Diabetes.   Marikay Alar, MD [facility]

## 2022-07-14 NOTE — Patient Instructions (Addendum)
Nice to see you. We will start you on Mounjaro 2.5 mg weekly for 4 weeks.  Once you complete that she will go on the 5 mg dose.  If you have any nausea, vomiting, abdominal pain, or any new onset symptoms after starting the [facility] please let us know.

## 2022-07-14 NOTE — Assessment & Plan Note (Addendum)
Chronic issue.  Uncontrolled.  Has improved slightly though.  She will start Mounjaro 2.5 mg weekly and increase to 5 mg after 4 weeks.  She will continue glipizide 10 mg daily.  She will continue metformin 1000 mg twice daily.  Encouraged diet and exercise.

## 2022-07-14 NOTE — Assessment & Plan Note (Signed)
Chronic issue.  Adequately controlled.  She will continue HCTZ 12.5 mg daily.

## 2022-07-15 LAB — MICROALBUMIN / CREATININE URINE RATIO
Creatinine, Urine: 79 mg/dL (ref 20–275)
Microalb, Ur: <0.2 mg/dL

## 2022-07-16 LAB — VITAMIN D 1,25 DIHYDROXY
Vitamin D 1, 25 (OH)2 Total: 37 pg/mL (ref 18–72)
Vitamin D2 1, 25 (OH)2: <8 pg/mL
Vitamin D3 1, 25 (OH)2: 37 pg/mL

## 2022-07-18 ENCOUNTER — Other Ambulatory Visit: Payer: BC Managed Care – PPO

## 2022-07-20 ENCOUNTER — Ambulatory Visit (INDEPENDENT_AMBULATORY_CARE_PROVIDER_SITE_OTHER): Payer: BC Managed Care – PPO | Admitting: Family Medicine

## 2022-08-15 ENCOUNTER — Encounter (INDEPENDENT_AMBULATORY_CARE_PROVIDER_SITE_OTHER): Payer: Self-pay | Admitting: Family Medicine

## 2022-08-15 ENCOUNTER — Ambulatory Visit (INDEPENDENT_AMBULATORY_CARE_PROVIDER_SITE_OTHER): Payer: BC Managed Care – PPO | Admitting: Family Medicine

## 2022-08-15 VITALS — BP 103/73 | HR 92 | Temp 97.5°F | Ht 65.0 in | Wt 272.0 lb

## 2022-08-15 DIAGNOSIS — Z7984 Long term (current) use of oral hypoglycemic drugs: Secondary | ICD-10-CM

## 2022-08-15 DIAGNOSIS — Z6841 Body Mass Index (BMI) 40.0 and over, adult: Secondary | ICD-10-CM

## 2022-08-15 DIAGNOSIS — F430 Acute stress reaction: Secondary | ICD-10-CM | POA: Diagnosis not present

## 2022-08-15 DIAGNOSIS — E1169 Type 2 diabetes mellitus with other specified complication: Secondary | ICD-10-CM | POA: Diagnosis not present

## 2022-08-15 DIAGNOSIS — Z7985 Long-term (current) use of injectable non-insulin antidiabetic drugs: Secondary | ICD-10-CM

## 2022-08-15 DIAGNOSIS — E669 Obesity, unspecified: Secondary | ICD-10-CM

## 2022-08-15 DIAGNOSIS — E785 Hyperlipidemia, unspecified: Secondary | ICD-10-CM | POA: Diagnosis not present

## 2022-08-15 DIAGNOSIS — E1165 Type 2 diabetes mellitus with hyperglycemia: Secondary | ICD-10-CM

## 2022-08-15 MED ORDER — BUPROPION HCL ER (XL) 150 MG PO TB24: 150.0000 mg | ORAL_TABLET | Freq: Every day | ORAL | 0 refills | Status: DC

## 2022-08-15 NOTE — Progress Notes (Signed)
Chief Complaint:   OBESITY Veronica Norton is here to discuss her progress with her obesity treatment plan along with follow-up of her obesity related diagnoses. Veronica Norton is on keeping a food journal and adhering to recommended goals of 1350-1500 calories and 100+ grams of protein and states she is following her eating plan approximately 30% of the time. Veronica Norton states she is walking for 30-45 minutes 1-2 times per week.  Today's visit was #: 43 Starting weight: 284 lbs Starting date: 02/03/2019 Today's weight: 272 lbs Today's date: 08/15/2022 Total lbs lost to date: 12 Total lbs lost since last in-office visit: 0  Interim History: Patient returns to the clinic for the first time since mid April.  She mentions she is struggling with journaling.  She is over 1500 calories daily.  Kids are here and are eating a good portion of food in her house.  Feels more tired but is getting frustrated with always feeling tired.  She feels like she needs to do this to change her life.   Subjective:   1. Hyperlipidemia associated with type 2 diabetes mellitus (HCC) Patient is on Crestor with no side effects noted. Her LDL was within normal limits, HDL low at 40, and triglycerides 35.  2. Type 2 diabetes mellitus with hyperglycemia, without long-term current use of insulin (HCC) Patient is on Mounjaro, metformin, and glipizide. Her last A1c was 11.0.  3. Stress disorder, acute Patient notes significant stress currently with her mother who has dementia. She denies suicidal or homicidal ideations. Her blood pressure is well managed. She has her significant other's 2 kids living with her now. She denies a history of seizures. Discussed stages of grief and support groups.   Assessment/Plan:   1. Hyperlipidemia associated with type 2 diabetes mellitus (HCC) Patient will continue Crestor 20 mg PO daily.  2. Type 2 diabetes mellitus with hyperglycemia, without long-term current use of insulin (HCC) Patient will  continue Mounjaro with no change in dose.   3. Stress disorder, acute Patient agreed to start Wellbutrin XL 150 mg once daily with no refills.   - buPROPion (WELLBUTRIN XL) 150 MG 24 hr tablet; Take 1 tablet (150 mg total) by mouth daily.  Dispense: 30 tablet; Refill: 0  4. BMI 45.0-49.9, adult (HCC)  5. Obesity with starting BMI of 47.2 Veronica Norton is currently in the action stage of change. As such, her goal is to continue with weight loss efforts. She has agreed to the Category 3 Plan and keeping a food journal and adhering to recommended goals of 1350-1500 calories and 100+ grams of protein daily.   Exercise goals: No exercise has been prescribed at this time.  Behavioral modification strategies: increasing lean protein intake, meal planning and cooking strategies, keeping healthy foods in the home, planning for success, and keeping a strict food journal.  Veronica Norton has agreed to follow-up with our clinic in 2 to 3 weeks. She was informed of the importance of frequent follow-up visits to maximize her success with intensive lifestyle modifications for her multiple health conditions.   Objective:   Blood pressure 103/73, pulse 92, temperature (!) 97.5 F (36.4 C), height 5\' 5"  (1.651 m), weight 272 lb (123.4 kg), last menstrual period 08/10/2022, SpO2 98 %. Body mass index is 45.26 kg/m.  General: Cooperative, alert, well developed, in no acute distress. HEENT: Conjunctivae and lids unremarkable. Cardiovascular: Regular rhythm.  Lungs: Normal work of breathing. Neurologic: No focal deficits.   Lab Results  Component Value Date   CREATININE  0.56 03/26/2022   BUN 10 03/26/2022   NA 134 (L) 03/26/2022   K 3.6 03/26/2022   CL 100 03/26/2022   CO2 26 03/26/2022   Lab Results  Component Value Date   ALT 20 11/16/2021   AST 14 11/16/2021   ALKPHOS 85 11/16/2021   BILITOT 0.3 11/16/2021   Lab Results  Component Value Date   HGBA1C 11.0 (A) 07/14/2022   HGBA1C 12.4 (H) 03/25/2022    HGBA1C 12.4 (H) 11/16/2021   HGBA1C 10.0 (H) 07/01/2021   HGBA1C 10.7 (H) 03/02/2021   Lab Results  Component Value Date   INSULIN 11.9 11/16/2021   Lab Results  Component Value Date   TSH 1.28 04/14/2022   Lab Results  Component Value Date   CHOL 80 03/26/2022   HDL 40 (L) 03/26/2022   LDLCALC 33 03/26/2022   LDLDIRECT 23.0 01/21/2020   TRIG 35 03/26/2022   CHOLHDL 2.0 03/26/2022   Lab Results  Component Value Date   VD25OH 28.02 (L) 04/14/2022   VD25OH 30.2 11/16/2021   VD25OH 22.62 (L) 05/07/2020   Lab Results  Component Value Date   WBC 7.7 03/26/2022   HGB 13.2 03/26/2022   HCT 39.2 03/26/2022   MCV 94.7 03/26/2022   PLT 340 03/26/2022   No results found for: "IRON", "TIBC", "FERRITIN"  Attestation Statements:   Reviewed by clinician on day of visit: allergies, medications, problem list, medical history, surgical history, family history, social history, and previous encounter notes.  Time spent on visit including pre-visit chart review and post-visit care and charting was 42 minutes.   I, Burt Knack, am acting as transcriptionist for Reuben Likes, MD.  I have reviewed the above documentation for accuracy and completeness, and I agree with the above. - Reuben Likes, MD

## 2022-08-23 NOTE — Progress Notes (Signed)
Name: Veronica Norton  MRN/ DOB: [id number], 1976-01-09   Age/ Sex: 47 y.o., female    PCP: Glori Luis, MD   Reason for Endocrinology Evaluation: Type 2 Diabetes Mellitus     Date of Initial Endocrinology Visit: 08/24/2022     PATIENT IDENTIFIER: Veronica Norton is a 47 y.o. female with a past medical history of DM. The patient presented for initial endocrinology clinic visit on 08/24/2022 for consultative assistance with her diabetes management.    HPI: Veronica Norton was    Diagnosed with DM 2007 Prior Medications tried/Intolerance: Wegovy- nausea. Trulicity -GI side effects, Victoza - no intolerance. Started on [facility] 2024 Currently checking blood sugars 0 x / day.  She was using freestyle libre but she stopped it due to frustration due to sensor not adhering properly and inaccuracy Hypoglycemia episodes : no                Hemoglobin A1c has ranged from 6.3% in 2013, peaking at 12.4% in 2024.  In terms of diet, the patient avoids sugar-sweetened beverages . Eats 3 meals a day, she does snack .   Since she has been on Mounjaro she has noted decrease craving to sweets, but she has been eating Fritos  Patient does endorse fatigue and brain fogginess Denies GI issues    HOME DIABETES REGIMEN: Glipizide 10 mg daily - has been taking it BID  Metformin 500 mg XR, 2 tabs twice daily Mounjaro 5 mg weekly   Statin: yes ACE-I/ARB: no   METER DOWNLOAD SUMMARY:   DIABETIC COMPLICATIONS: Microvascular complications:   Denies: CKD Last eye exam: Completed   Macrovascular complications:   Denies: CAD, PVD, CVA   PAST HISTORY: Past Medical History:  Past Medical History:  Diagnosis Date   Diabetes mellitus    H/O pilonidal cyst    Hypertension    Normal cardiac stress test 2009   Pre-diabetes    Past Surgical History:  Past Surgical History:  Procedure Laterality Date   COLONOSCOPY WITH PROPOFOL N/A 07/22/2021   Procedure: COLONOSCOPY WITH  PROPOFOL;  Surgeon: Toney Reil, MD;  Location: [facility] ENDOSCOPY;  Service: Gastroenterology;  Laterality: N/A;   PILONIDAL CYST EXCISION     WISDOM TOOTH EXTRACTION      Social History:  reports that she has never smoked. She has never used smokeless tobacco. She reports current alcohol use. She reports that she does not use drugs. Family History:  Family History  Problem Relation Age of Onset   Heart disease Mother        2005 stent placement   Hypertension Mother    Diabetes Father    Hypertension Father    Obesity Father    Stroke Maternal Aunt    Cancer Maternal Grandfather        prostate   Heart disease Maternal Grandfather    Heart disease Maternal Uncle    Heart disease Maternal Grandmother      HOME MEDICATIONS: Allergies as of 08/24/2022   No Known Allergies      Medication List        Accurate as of August 24, 2022  1:04 PM. If you have any questions, ask your nurse or doctor.          buPROPion 150 MG 24 hr tablet Commonly known as: Wellbutrin XL Take 1 tablet (150 mg total) by mouth daily.   FreeStyle Libre 3 Sensor Misc 1 Device by Does not apply route every 14 (fourteen) days.  Place 1 sensor on the skin every 14 days. Use to check glucose continuously What changed:  how much to take how to take this Changed by: Scarlette Shorts, MD   glipiZIDE 5 MG tablet Commonly known as: GLUCOTROL Take 1 tablet (5 mg total) by mouth 2 (two) times daily before a meal. What changed:  medication strength how much to take when to take this Changed by: Scarlette Shorts, MD   hydrochlorothiazide 12.5 MG capsule Commonly known as: MICROZIDE TAKE 1 CAPSULE BY MOUTH EVERY DAY   metFORMIN 500 MG 24 hr tablet Commonly known as: GLUCOPHAGE-XR Take 2 tablets (1,000 mg total) by mouth 2 (two) times daily.   rosuvastatin 20 MG tablet Commonly known as: CRESTOR Take 1 tablet (20 mg total) by mouth daily.   tirzepatide 5 MG/0.5ML Pen Commonly known  as: MOUNJARO Inject 5 mg into the skin once a week. Start after completing the 28 days of the 2.5 mg dose.         ALLERGIES: No Known Allergies   REVIEW OF SYSTEMS: A comprehensive ROS was conducted with the patient and is negative except as per HPI     OBJECTIVE:   VITAL SIGNS: BP 126/80 (BP Location: Left Arm, Patient Position: Sitting, Cuff Size: Large)   Pulse 85   Ht 5\' 5"  (1.651 m)   Wt 276 lb (125.2 kg)   LMP 08/10/2022   SpO2 94%   BMI 45.93 kg/m    PHYSICAL EXAM:  General: Pt appears well and is in NAD  Neck: General: Supple without adenopathy or carotid bruits. Thyroid: Thyroid size normal.  No goiter or nodules appreciated.   Lungs: Clear with good BS bilat   Heart: RRR   Abdomen:  soft, nontender  Extremities:  Lower extremities - No pretibial edema.   Neuro: MS is good with appropriate affect, pt is alert and Ox3    DM foot exam: 08/24/2022  The skin of the feet is intact without sores or ulcerations. The pedal pulses are 2+ on right and 2+ on left. The sensation is intact to a screening 5.07, 10 gram monofilament bilaterally    DATA REVIEWED:  Lab Results  Component Value Date   HGBA1C 11.0 (A) 07/14/2022   HGBA1C 12.4 (H) 03/25/2022   HGBA1C 12.4 (H) 11/16/2021    Latest Reference Range & Units 03/26/22 04:20  Sodium 135 - 145 mmol/L 134 (L)  Potassium 3.5 - 5.1 mmol/L 3.6  Chloride 98 - 111 mmol/L 100  CO2 22 - 32 mmol/L 26  Glucose 70 - 99 mg/dL 161 (H)  BUN 6 - 20 mg/dL 10  Creatinine 0.96 - 0.45 mg/dL 4.09  Calcium 8.9 - 81.1 mg/dL 8.7 (L)  Anion gap 5 - 15  8  Phosphorus 2.5 - 4.6 mg/dL 4.1  Magnesium 1.7 - 2.4 mg/dL 2.0  Albumin 3.5 - 5.0 g/dL 3.1 (L)  GFR, Estimated >60 mL/min >60  Total CHOL/HDL Ratio RATIO 2.0  Cholesterol 0 - 200 mg/dL 80  HDL Cholesterol >91 mg/dL 40 (L)  LDL (calc) 0 - 99 mg/dL 33  Triglycerides <478 mg/dL 35  VLDL 0 - 40 mg/dL 7  (L): Data is abnormally low (H): Data is abnormally high  Old  records , labs and images have been reviewed.   ASSESSMENT / PLAN / RECOMMENDATIONS:   1) Type 2 Diabetes Mellitus, Poorly controlled, Without complications - Most recent A1c of 11 %. Goal A1c < 7.0 %.     -She has intolerance  to [employer] and Trulicity -She has been on [facility] for approximately 2 weeks, so far she is not having any side effects -We had a discussion regarding the importance of avoiding sugar sweetened beverages, and regular exercise -Her in office BG 145 mg/DL (fasting) -I have recommended decreasing glipizide, as I suspect her insulin sensitivity will continue to improve and she may be at risk for hypoglycemia with longer use of Mounjaro -She has been accidentally taking glipizide 10 mg twice daily, I will reduce this to 5 mg and continue to take it twice daily -She is not sure if she is going to continue on freestyle libre, patient advised to use skin- Tac liquid if she decides to use the sensors to improve adherence  MEDICATIONS: Change glipizide 5 mg twice daily Continue metformin 500 mg, 2 tabs twice daily Continue Mounjaro 5 mg weekly  EDUCATION / INSTRUCTIONS: BG monitoring instructions: Patient is instructed to check her blood sugars 1 times a day. Call [facility] if: BG persistently < 70  I reviewed the Rule of 15 for the treatment of hypoglycemia in detail with the patient. Literature supplied.   2) Diabetic complications:  Eye: Does not have known diabetic retinopathy.  Neuro/ Feet: Does not have known diabetic peripheral neuropathy. Renal: Patient does not have known baseline CKD. She is not on an ACEI/ARB at present.  3) Dyslipidemia:  -Patient on statin therapy -LDL at goal   Follow-up in 3 months Signed electronically by: Lyndle Herrlich, MD  [facility]  [facility] [address] [place] [place], [place] [address] Phone: [phone] FAX: [phone]   CC: Glori Luis, MD [address] [place] [address] Phone: [phone]  Fax: [phone]    Return to Endocrinology clinic as below: Future Appointments  Date Time Provider Department Center  09/05/2022 12:40 PM Langston Reusing, MD [facility] None  10/18/2022  3:40 PM Glori Luis, MD [facility]  02/06/2023  3:00 PM Damione Robideau, Konrad Dolores, MD [facility] None

## 2022-08-24 ENCOUNTER — Encounter: Payer: Self-pay | Admitting: Internal Medicine

## 2022-08-24 ENCOUNTER — Ambulatory Visit: Payer: BC Managed Care – PPO | Admitting: Internal Medicine

## 2022-08-24 VITALS — BP 126/80 | HR 85 | Ht 65.0 in | Wt 276.0 lb

## 2022-08-24 DIAGNOSIS — Z7985 Long-term (current) use of injectable non-insulin antidiabetic drugs: Secondary | ICD-10-CM | POA: Diagnosis not present

## 2022-08-24 DIAGNOSIS — Z7984 Long term (current) use of oral hypoglycemic drugs: Secondary | ICD-10-CM | POA: Diagnosis not present

## 2022-08-24 DIAGNOSIS — E119 Type 2 diabetes mellitus without complications: Secondary | ICD-10-CM

## 2022-08-24 DIAGNOSIS — E1165 Type 2 diabetes mellitus with hyperglycemia: Secondary | ICD-10-CM

## 2022-08-24 LAB — POCT GLUCOSE (DEVICE FOR HOME USE): POC Glucose: 145 mg/dl — AB (ref 70–99)

## 2022-08-24 MED ORDER — METFORMIN HCL ER 500 MG PO TB24: 1000.0000 mg | ORAL_TABLET | Freq: Two times a day (BID) | ORAL | 3 refills | Status: DC

## 2022-08-24 MED ORDER — GLIPIZIDE 5 MG PO TABS: 5.0000 mg | ORAL_TABLET | Freq: Two times a day (BID) | ORAL | 3 refills | Status: DC

## 2022-08-24 MED ORDER — FREESTYLE LIBRE 3 SENSOR MISC: 1.0000 | 11 refills | Status: DC

## 2022-08-24 NOTE — Patient Instructions (Addendum)
Change Glipizide 5 mg , 1 tablet before Breakfast and 1 tablet Before Supper Metformin 500 mg, 2 tablets, with Breakfast and 2 tablet with Supper Continue Mounjaro 5 mg weekly   HOW TO TREAT LOW BLOOD SUGARS (Blood sugar LESS THAN 70 MG/DL) Please follow the RULE OF 15 for the treatment of hypoglycemia treatment (when your (blood sugars are less than 70 mg/dL)   STEP 1: Take 15 grams of carbohydrates when your blood sugar is low, which includes:  3-4 GLUCOSE TABS  OR 3-4 OZ OF JUICE OR REGULAR SODA OR ONE TUBE OF GLUCOSE GEL    STEP 2: RECHECK blood sugar in 15 MINUTES STEP 3: If your blood sugar is still low at the 15 minute recheck --> then, go back to STEP 1 and treat AGAIN with another 15 grams of carbohydrates.

## 2022-09-05 ENCOUNTER — Ambulatory Visit (INDEPENDENT_AMBULATORY_CARE_PROVIDER_SITE_OTHER): Payer: BC Managed Care – PPO | Admitting: Family Medicine

## 2022-09-19 ENCOUNTER — Other Ambulatory Visit (INDEPENDENT_AMBULATORY_CARE_PROVIDER_SITE_OTHER): Payer: Self-pay | Admitting: Family Medicine

## 2022-09-19 DIAGNOSIS — F430 Acute stress reaction: Secondary | ICD-10-CM

## 2022-10-09 ENCOUNTER — Other Ambulatory Visit: Payer: Self-pay | Admitting: Family Medicine

## 2022-10-18 ENCOUNTER — Ambulatory Visit: Payer: BC Managed Care – PPO | Admitting: Family Medicine

## 2022-10-26 ENCOUNTER — Encounter (INDEPENDENT_AMBULATORY_CARE_PROVIDER_SITE_OTHER): Payer: Self-pay

## 2022-11-07 ENCOUNTER — Other Ambulatory Visit (INDEPENDENT_AMBULATORY_CARE_PROVIDER_SITE_OTHER): Payer: Self-pay | Admitting: Family Medicine

## 2022-11-07 DIAGNOSIS — F430 Acute stress reaction: Secondary | ICD-10-CM

## 2022-11-20 ENCOUNTER — Encounter: Payer: Self-pay | Admitting: Family Medicine

## 2022-11-20 ENCOUNTER — Telehealth (INDEPENDENT_AMBULATORY_CARE_PROVIDER_SITE_OTHER): Payer: BC Managed Care – PPO | Admitting: Family Medicine

## 2022-11-20 DIAGNOSIS — E1165 Type 2 diabetes mellitus with hyperglycemia: Secondary | ICD-10-CM

## 2022-11-20 DIAGNOSIS — Z7984 Long term (current) use of oral hypoglycemic drugs: Secondary | ICD-10-CM

## 2022-11-20 DIAGNOSIS — E785 Hyperlipidemia, unspecified: Secondary | ICD-10-CM | POA: Diagnosis not present

## 2022-11-20 DIAGNOSIS — E1169 Type 2 diabetes mellitus with other specified complication: Secondary | ICD-10-CM | POA: Diagnosis not present

## 2022-11-20 MED ORDER — ROSUVASTATIN CALCIUM 20 MG PO TABS
20.0000 mg | ORAL_TABLET | Freq: Every day | ORAL | 0 refills | Status: DC
Start: 2022-11-20 — End: 2023-03-28

## 2022-11-20 NOTE — Progress Notes (Signed)
Virtual Visit via video Note  I connected with Veronica Norton today at  1:40 PM EDT by a video enabled telemedicine application and verified that I am speaking with the correct person using two identifiers. Location patient: home Location provider: work  Persons participating in the virtual visit: patient, provider  I discussed the limitations, risks, security and privacy concerns of performing an evaluation and management service by telephone and the availability of in person appointments. I also discussed with the patient that there may be a patient responsible charge related to this service. The patient expressed understanding and agreed to proceed.  Reason for visit: f/u.  HPI: DIABETES Disease Monitoring: Blood Sugar ranges-not checking Polyuria/phagia/dipsia- no      Optho- UTD Medications: Compliance- taking mounjaro, glipizide 5 mg BID, metformin Hypoglycemic symptoms- notes rarely occurs if she does not eat consistently Patient reports that little bit of nausea and vomiting on 1-2 occasions while on the Mounjaro Notes weight has come down some and feels as though her clothes fit differently.    ROS: See pertinent positives and negatives per HPI.  Past Medical History:  Diagnosis Date   Diabetes mellitus    H/O pilonidal cyst    Hypertension    Normal cardiac stress test 2009   Pre-diabetes     Past Surgical History:  Procedure Laterality Date   COLONOSCOPY WITH PROPOFOL N/A 07/22/2021   Procedure: COLONOSCOPY WITH PROPOFOL;  Surgeon: Toney Reil, MD;  Location: [facility] ENDOSCOPY;  Service: Gastroenterology;  Laterality: N/A;   PILONIDAL CYST EXCISION     WISDOM TOOTH EXTRACTION      Family History  Problem Relation Age of Onset   Heart disease Mother        2005 stent placement   Hypertension Mother    Diabetes Father    Hypertension Father    Obesity Father    Stroke Maternal Aunt    Cancer Maternal Grandfather        prostate   Heart disease  Maternal Grandfather    Heart disease Maternal Uncle    Heart disease Maternal Grandmother     SOCIAL HX: Non-smoker   Current Outpatient Medications:    buPROPion (WELLBUTRIN XL) 150 MG 24 hr tablet, Take 1 tablet (150 mg total) by mouth daily., Disp: 30 tablet, Rfl: 0   Continuous Glucose Sensor (FREESTYLE LIBRE 3 SENSOR) MISC, 1 Device by Does not apply route every 14 (fourteen) days. Place 1 sensor on the skin every 14 days. Use to check glucose continuously, Disp: 2 each, Rfl: 11   glipiZIDE (GLUCOTROL) 5 MG tablet, Take 1 tablet (5 mg total) by mouth 2 (two) times daily before a meal., Disp: 180 tablet, Rfl: 3   hydrochlorothiazide (MICROZIDE) 12.5 MG capsule, TAKE 1 CAPSULE BY MOUTH EVERY DAY, Disp: 90 capsule, Rfl: 1   metFORMIN (GLUCOPHAGE-XR) 500 MG 24 hr tablet, Take 2 tablets (1,000 mg total) by mouth 2 (two) times daily., Disp: 360 tablet, Rfl: 3   tirzepatide (MOUNJARO) 5 MG/0.5ML Pen, Inject 5 mg into the skin once a week. Start after completing the 28 days of the 2.5 mg dose., Disp: 6 mL, Rfl: 1   rosuvastatin (CRESTOR) 20 MG tablet, Take 1 tablet (20 mg total) by mouth daily., Disp: 90 tablet, Rfl: 0  EXAM:  VITALS per patient if applicable:  GENERAL: alert, oriented, appears well and in no acute distress  HEENT: atraumatic, conjunttiva clear, no obvious abnormalities on inspection of external nose and ears  NECK: normal movements  of the head and neck  LUNGS: on inspection no signs of respiratory distress, breathing rate appears normal, no obvious gross SOB, gasping or wheezing  CV: no obvious cyanosis  MS: moves all visible extremities without noticeable abnormality  PSYCH/NEURO: pleasant and cooperative, no obvious depression or anxiety, speech and thought processing grossly intact  ASSESSMENT AND PLAN:  Discussed the following assessment and plan:  Problem List Items Addressed This Visit     Hyperlipidemia associated with type 2 diabetes mellitus (HCC)    Relevant Medications   rosuvastatin (CRESTOR) 20 MG tablet   Severe obesity (BMI >= 40) (HCC)    Chronic issue.  Patient feels that she has been losing weight on Mounjaro.  For now she will continue Mounjaro 5 mg weekly.  She will come in for nurse visit and have her weight checked and we could consider increasing the [facility] depending on her A1c and weight check results.      Uncontrolled type 2 diabetes mellitus with hyperglycemia, without long-term current use of insulin (HCC) - Primary    Chronic issue.  Undetermined control.  She will come in for point-of-care A1c.  She will continue Mounjaro 5 mg weekly, glipizide 5 mg twice daily, and metformin XR 1000 mg twice daily.      Relevant Medications   rosuvastatin (CRESTOR) 20 MG tablet    Return in about 1 week (around 11/27/2022) for nurse visit for POC A1c, flu shot, and weight check, 4 months with PCP.   I discussed the assessment and treatment plan with the patient. The patient was provided an opportunity to ask questions and all were answered. The patient agreed with the plan and demonstrated an understanding of the instructions.   The patient was advised to call back or seek an in-person evaluation if the symptoms worsen or if the condition fails to improve as anticipated.  Marikay Alar, MD

## 2022-11-20 NOTE — Assessment & Plan Note (Signed)
Chronic issue.  Patient feels that she has been losing weight on Mounjaro.  For now she will continue Mounjaro 5 mg weekly.  She will come in for nurse visit and have her weight checked and we could consider increasing the [facility] depending on her A1c and weight check results.

## 2022-11-20 NOTE — Assessment & Plan Note (Signed)
Chronic issue.  Undetermined control.  She will come in for point-of-care A1c.  She will continue Mounjaro 5 mg weekly, glipizide 5 mg twice daily, and metformin XR 1000 mg twice daily.

## 2022-12-11 ENCOUNTER — Ambulatory Visit (INDEPENDENT_AMBULATORY_CARE_PROVIDER_SITE_OTHER): Payer: BC Managed Care – PPO

## 2022-12-11 DIAGNOSIS — Z23 Encounter for immunization: Secondary | ICD-10-CM | POA: Diagnosis not present

## 2022-12-11 DIAGNOSIS — E1165 Type 2 diabetes mellitus with hyperglycemia: Secondary | ICD-10-CM

## 2022-12-11 DIAGNOSIS — Z7984 Long term (current) use of oral hypoglycemic drugs: Secondary | ICD-10-CM | POA: Diagnosis not present

## 2022-12-11 LAB — POCT GLYCOSYLATED HEMOGLOBIN (HGB A1C)
HbA1c POC (<> result, manual entry): 6.5 % (ref 4.0–5.6)
HbA1c, POC (controlled diabetic range): 6.5 % (ref 0.0–7.0)
HbA1c, POC (prediabetic range): 6.5 % — AB (ref 5.7–6.4)
Hemoglobin A1C: 6.5 % — AB (ref 4.0–5.6)

## 2022-12-11 NOTE — Progress Notes (Signed)
Pt presented today to get a point of care A1c and a flu shot. Pt was identified through two identifiers. Pt was given flu VIS. After cleaning the index finger on the right hand I used a lancet to prick pts finger. Pt did not voice any pain or concern A1C was drawn up and calculated. Pt was informed that her A1C was  6.5%   she verbalized understanding and that her PCP would be notified of results and will consult with her accordingly. Pt was administered the Flu vaccine in the left deltoid.    Pt verbalized that her A1C is a lot better and wanted to see if Dr. Birdie Sons could see her for a minute, I informed her that I would try to see if he could but he was busy. Pt understood and stated she will wait to hear from him or his assistant later.

## 2023-02-06 ENCOUNTER — Ambulatory Visit: Payer: BC Managed Care – PPO | Admitting: Internal Medicine

## 2023-02-12 DIAGNOSIS — Z1231 Encounter for screening mammogram for malignant neoplasm of breast: Secondary | ICD-10-CM | POA: Diagnosis not present

## 2023-02-14 DIAGNOSIS — Z01419 Encounter for gynecological examination (general) (routine) without abnormal findings: Secondary | ICD-10-CM | POA: Diagnosis not present

## 2023-03-01 LAB — HM DIABETES EYE EXAM

## 2023-03-28 ENCOUNTER — Ambulatory Visit: Payer: BC Managed Care – PPO | Admitting: Family Medicine

## 2023-03-28 ENCOUNTER — Encounter: Payer: Self-pay | Admitting: Family Medicine

## 2023-03-28 VITALS — BP 130/92 | HR 95 | Temp 97.5°F | Ht 65.0 in | Wt 271.4 lb

## 2023-03-28 DIAGNOSIS — I152 Hypertension secondary to endocrine disorders: Secondary | ICD-10-CM

## 2023-03-28 DIAGNOSIS — E1169 Type 2 diabetes mellitus with other specified complication: Secondary | ICD-10-CM | POA: Diagnosis not present

## 2023-03-28 DIAGNOSIS — Z111 Encounter for screening for respiratory tuberculosis: Secondary | ICD-10-CM

## 2023-03-28 DIAGNOSIS — E1159 Type 2 diabetes mellitus with other circulatory complications: Secondary | ICD-10-CM

## 2023-03-28 DIAGNOSIS — Z7984 Long term (current) use of oral hypoglycemic drugs: Secondary | ICD-10-CM

## 2023-03-28 DIAGNOSIS — Z7985 Long-term (current) use of injectable non-insulin antidiabetic drugs: Secondary | ICD-10-CM

## 2023-03-28 DIAGNOSIS — E119 Type 2 diabetes mellitus without complications: Secondary | ICD-10-CM

## 2023-03-28 DIAGNOSIS — E785 Hyperlipidemia, unspecified: Secondary | ICD-10-CM | POA: Diagnosis not present

## 2023-03-28 DIAGNOSIS — E559 Vitamin D deficiency, unspecified: Secondary | ICD-10-CM | POA: Diagnosis not present

## 2023-03-28 LAB — COMPREHENSIVE METABOLIC PANEL
ALT: 15 U/L (ref 0–35)
AST: 14 U/L (ref 0–37)
Albumin: 3.7 g/dL (ref 3.5–5.2)
Alkaline Phosphatase: 67 U/L (ref 39–117)
BUN: 20 mg/dL (ref 6–23)
CO2: 27 meq/L (ref 19–32)
Calcium: 9 mg/dL (ref 8.4–10.5)
Chloride: 100 meq/L (ref 96–112)
Creatinine, Ser: 0.7 mg/dL (ref 0.40–1.20)
GFR: 103.4 mL/min (ref >60.00)
Glucose, Bld: 145 mg/dL — ABNORMAL HIGH (ref 70–99)
Potassium: 3.9 meq/L (ref 3.5–5.1)
Sodium: 135 meq/L (ref 135–145)
Total Bilirubin: 0.3 mg/dL (ref 0.2–1.2)
Total Protein: 7.1 g/dL (ref 6.0–8.3)

## 2023-03-28 LAB — LIPID PANEL
Cholesterol: 140 mg/dL (ref 0–200)
HDL: 53.9 mg/dL (ref >39.00)
LDL Cholesterol: 74 mg/dL (ref 0–99)
NonHDL: 86.22
Total CHOL/HDL Ratio: 3
Triglycerides: 59 mg/dL (ref 0.0–149.0)
VLDL: 11.8 mg/dL (ref 0.0–40.0)

## 2023-03-28 LAB — HEMOGLOBIN A1C: Hgb A1c MFr Bld: 6.9 % — ABNORMAL HIGH (ref 4.6–6.5)

## 2023-03-28 LAB — VITAMIN D 25 HYDROXY (VIT D DEFICIENCY, FRACTURES): VITD: 24.18 ng/mL — ABNORMAL LOW (ref 30.00–100.00)

## 2023-03-28 MED ORDER — ROSUVASTATIN CALCIUM 20 MG PO TABS
20.0000 mg | ORAL_TABLET | Freq: Every day | ORAL | 0 refills | Status: DC
Start: 2023-03-28 — End: 2023-10-29

## 2023-03-28 MED ORDER — HYDROCHLOROTHIAZIDE 12.5 MG PO CAPS
ORAL_CAPSULE | ORAL | 1 refills | Status: DC
Start: 2023-03-28 — End: 2023-04-23

## 2023-03-28 MED ORDER — TIRZEPATIDE 5 MG/0.5ML ~~LOC~~ SOAJ
5.0000 mg | SUBCUTANEOUS | 1 refills | Status: DC
Start: 2023-03-28 — End: 2023-04-23

## 2023-03-28 NOTE — Patient Instructions (Signed)
 Nice to see you. Please start checking your blood pressure once daily when you have been at rest for 10 to 15 minutes.  Will have you return in 2 weeks for recheck.

## 2023-03-28 NOTE — Assessment & Plan Note (Signed)
 Chronic issue.  Most recent A1c adequately controlled.  Check A1c today.  Patient will continue glipizide  5 mg twice daily, metformin  1000 mg twice daily, and Mounjaro  5 mg weekly.

## 2023-03-28 NOTE — Progress Notes (Signed)
PPD Placement note Veronica Norton, 48 y.o. female is here today for placement of PPD test Reason for PPD test: Employment Pt taken PPD test before: yes Verified in allergy area and with patient that they are not allergic to the products PPD is made of (Phenol or Tween). Yes Is patient taking any oral or IV steroid medication now or have they taken it in the last month? yes Has the patient ever received the BCG vaccine?: no Has the patient been in recent contact with anyone known or suspected of having active TB disease?: no      Date of exposure (if applicable): N/A      Name of person they were exposed to (if applicable): N/A Patient's Country of origin?: [place] O: Alert and oriented in NAD. P:  PPD placed on 03/28/2023.  Patient advised to return for reading within 48-72 hours.

## 2023-03-28 NOTE — Progress Notes (Signed)
Marikay Alar, MD Phone: [phone]  Veronica Norton is a 48 y.o. female who presents today for f/u.  DIABETES Disease Monitoring: Blood Sugar ranges-not checking Polyuria/phagia/dipsia- no      Optho- UTD Medications: Compliance- taking glipizide, metformin, mounjaro Hypoglycemic symptoms- no Was off mounjaro for 2-3 weeks around the holidays.  HYPERTENSION Disease Monitoring Home BP Monitoring not checking Chest pain- no    Dyspnea- no Medications Compliance-  taking HCTZ.  Edema- no BMET    Component Value Date/Time   NA 134 (L) 03/26/2022 0420   NA 135 11/16/2021 0845   K 3.6 03/26/2022 0420   CL 100 03/26/2022 0420   CO2 26 03/26/2022 0420   GLUCOSE 206 (H) 03/26/2022 0420   BUN 10 03/26/2022 0420   BUN 14 11/16/2021 0845   CREATININE 0.56 03/26/2022 0420   CREATININE 0.67 02/06/2018 1506   CALCIUM 8.7 (L) 03/26/2022 0420   GFRNONAA >60 03/26/2022 0420   GFRAA 117 08/18/2019 1712      Social History   Tobacco Use  Smoking Status Never  Smokeless Tobacco Never    Current Outpatient Medications on File Prior to Visit  Medication Sig Dispense Refill   glipiZIDE (GLUCOTROL) 5 MG tablet Take 1 tablet (5 mg total) by mouth 2 (two) times daily before a meal. 180 tablet 3   metFORMIN (GLUCOPHAGE-XR) 500 MG 24 hr tablet Take 2 tablets (1,000 mg total) by mouth 2 (two) times daily. 360 tablet 3   No current facility-administered medications on file prior to visit.     ROS see history of present illness  Objective  Physical Exam Vitals:   03/28/23 0817 03/28/23 0832  BP: (!) 130/90 (!) 130/92  Pulse: 95   Temp: (!) 97.5 F (36.4 C)   SpO2: 98%     BP Readings from Last 3 Encounters:  03/28/23 (!) 130/92  08/24/22 126/80  08/15/22 103/73   Wt Readings from Last 3 Encounters:  03/28/23 271 lb 6 oz (123.1 kg)  08/24/22 276 lb (125.2 kg)  08/15/22 272 lb (123.4 kg)    Physical Exam Constitutional:      General: She is not in acute  distress.    Appearance: She is not diaphoretic.  Cardiovascular:     Rate and Rhythm: Normal rate and regular rhythm.     Heart sounds: Normal heart sounds.  Pulmonary:     Effort: Pulmonary effort is normal.     Breath sounds: Normal breath sounds.  Skin:    General: Skin is warm and dry.  Neurological:     Mental Status: She is alert.      Assessment/Plan: Please see individual problem list.  Type 2 diabetes mellitus without complication, without long-term current use of insulin (HCC) Assessment & Plan: Chronic issue.  Most recent A1c adequately controlled.  Check A1c today.  Patient will continue glipizide 5 mg twice daily, metformin 1000 mg twice daily, and Mounjaro 5 mg weekly.  Orders: -     Tirzepatide; Inject 5 mg into the skin once a week. Start after completing the 28 days of the 2.5 mg dose.  Dispense: 6 mL; Refill: 1 -     Comprehensive metabolic panel -     Hemoglobin A1c  Hyperlipidemia associated with type 2 diabetes mellitus (HCC) -     Rosuvastatin Calcium; Take 1 tablet (20 mg total) by mouth daily.  Dispense: 90 tablet; Refill: 0 -     Comprehensive metabolic panel -     Lipid panel  Hypertension associated with diabetes [facility]) Assessment & Plan: Chronic issue.  Elevated today though had been previously well-controlled.  She will start checking at home and return in 2 weeks for recheck of her blood pressure.  She will continue HCTZ 12.5 mg daily.  Orders: -     hydroCHLOROthiazide; TAKE 1 CAPSULE BY MOUTH EVERY DAY  Dispense: 90 capsule; Refill: 1 -     Comprehensive metabolic panel  Vitamin D deficiency -     VITAMIN D 25 Hydroxy (Vit-D Deficiency, Fractures)  Screening for tuberculosis -     TB Skin Test    PPD placed for patient's work.  Patient encouraged to see ophthalmology once yearly.  Return in about 2 weeks (around 04/11/2023) for Hypertension follow-up, 6 months transfer of care.   Marikay Alar, MD [facility]

## 2023-03-28 NOTE — Assessment & Plan Note (Signed)
 Chronic issue.  Elevated today though had been previously well-controlled.  She will start checking at home and return in 2 weeks for recheck of her blood pressure.  She will continue HCTZ 12.5 mg daily.

## 2023-03-30 ENCOUNTER — Ambulatory Visit: Payer: BC Managed Care – PPO

## 2023-03-30 NOTE — Progress Notes (Signed)
PPD Reading Note  PPD read and results entered in EpicCare.  Result: 0 mm induration.  Interpretation: Negative  If test not read within 48-72 hours of initial placement, patient advised to repeat in other arm 1-3 weeks after this test.  Allergic reaction: no

## 2023-04-09 ENCOUNTER — Ambulatory Visit: Payer: BC Managed Care – PPO

## 2023-04-11 ENCOUNTER — Ambulatory Visit: Payer: BC Managed Care – PPO | Admitting: Family Medicine

## 2023-04-23 ENCOUNTER — Encounter: Payer: Self-pay | Admitting: Family Medicine

## 2023-04-23 ENCOUNTER — Ambulatory Visit: Payer: BC Managed Care – PPO | Admitting: Family Medicine

## 2023-04-23 VITALS — BP 130/90 | HR 71 | Temp 98.0°F | Ht 65.0 in | Wt 277.0 lb

## 2023-04-23 DIAGNOSIS — Z7985 Long-term (current) use of injectable non-insulin antidiabetic drugs: Secondary | ICD-10-CM

## 2023-04-23 DIAGNOSIS — E1159 Type 2 diabetes mellitus with other circulatory complications: Secondary | ICD-10-CM | POA: Diagnosis not present

## 2023-04-23 DIAGNOSIS — E119 Type 2 diabetes mellitus without complications: Secondary | ICD-10-CM

## 2023-04-23 DIAGNOSIS — I152 Hypertension secondary to endocrine disorders: Secondary | ICD-10-CM

## 2023-04-23 MED ORDER — HYDROCHLOROTHIAZIDE 25 MG PO TABS: 25.0000 mg | ORAL_TABLET | Freq: Every day | ORAL | 1 refills | Status: DC

## 2023-04-23 MED ORDER — TIRZEPATIDE 5 MG/0.5ML ~~LOC~~ SOAJ: 5.0000 mg | SUBCUTANEOUS | 1 refills | Status: DC

## 2023-04-23 NOTE — Assessment & Plan Note (Addendum)
 Chronic issue.  Uncontrolled.  We will increase HCTZ to 25 mg daily.  She will return in 1 month for recheck of blood pressure and labs.  Advised of the potential for increased urination on the HCTZ.  If this occurs in an excessive manner she will let us  know.

## 2023-04-23 NOTE — Progress Notes (Signed)
  Nelly Banco, MD Phone: [phone]  Veronica Norton is a 48 y.o. female who presents today for f/u.  HYPERTENSION Disease Monitoring Home BP Monitoring not checking Chest pain- no    Dyspnea- no Medications Compliance-  taking HCTZ.  Edema- no BMET    Component Value Date/Time   NA 135 03/28/2023 0857   NA 135 11/16/2021 0845   K 3.9 03/28/2023 0857   CL 100 03/28/2023 0857   CO2 27 03/28/2023 0857   GLUCOSE 145 (H) 03/28/2023 0857   BUN 20 03/28/2023 0857   BUN 14 11/16/2021 0845   CREATININE 0.70 03/28/2023 0857   CREATININE 0.67 02/06/2018 1506   CALCIUM  9.0 03/28/2023 0857   GFRNONAA >60 03/26/2022 0420   GFRAA 117 08/18/2019 1712     Social History   Tobacco Use  Smoking Status Never  Smokeless Tobacco Never    Current Outpatient Medications on File Prior to Visit  Medication Sig Dispense Refill   glipiZIDE  (GLUCOTROL ) 5 MG tablet Take 1 tablet (5 mg total) by mouth 2 (two) times daily before a meal. 180 tablet 3   metFORMIN  (GLUCOPHAGE -XR) 500 MG 24 hr tablet Take 2 tablets (1,000 mg total) by mouth 2 (two) times daily. 360 tablet 3   rosuvastatin  (CRESTOR ) 20 MG tablet Take 1 tablet (20 mg total) by mouth daily. 90 tablet 0   No current facility-administered medications on file prior to visit.     ROS see history of present illness  Objective  Physical Exam Vitals:   04/23/23 1509  BP: (!) 130/90  Pulse: 71  Temp: 98 F (36.7 C)  SpO2: 96%    BP Readings from Last 3 Encounters:  04/23/23 (!) 130/90  03/28/23 (!) 130/92  08/24/22 126/80   Wt Readings from Last 3 Encounters:  04/23/23 277 lb (125.6 kg)  03/28/23 271 lb 6 oz (123.1 kg)  08/24/22 276 lb (125.2 kg)    Physical Exam Constitutional:      General: She is not in acute distress.    Appearance: She is not diaphoretic.  Cardiovascular:     Rate and Rhythm: Normal rate and regular rhythm.     Heart sounds: Normal heart sounds.  Pulmonary:     Effort: Pulmonary  effort is normal.  Musculoskeletal:     Right lower leg: No edema.     Left lower leg: No edema.  Skin:    General: Skin is warm and dry.  Neurological:     Mental Status: She is alert.      Assessment/Plan: Please see individual problem list.  Hypertension associated with diabetes (HCC) Assessment & Plan: Chronic issue.  Uncontrolled.  We will increase HCTZ to 25 mg daily.  She will return in 1 month for recheck of blood pressure and labs.  Advised of the potential for increased urination on the HCTZ.  If this occurs in an excessive manner she will let us  know.  Orders: -     hydroCHLOROthiazide ; Take 1 tablet (25 mg total) by mouth daily.  Dispense: 90 tablet; Refill: 1  Type 2 diabetes mellitus without complication, without long-term current use of insulin  (HCC) -     Tirzepatide ; Inject 5 mg into the skin once a week.  Dispense: 6 mL; Refill: 1   Return in about 1 month (around 05/21/2023) for Follow-up blood pressure with a new provider.   Nelly Banco, MD [facility] Primary Care [facility]

## 2023-05-11 ENCOUNTER — Ambulatory Visit: Payer: BC Managed Care – PPO | Admitting: Internal Medicine

## 2023-05-24 ENCOUNTER — Ambulatory Visit: Payer: BC Managed Care – PPO | Admitting: Nurse Practitioner

## 2023-05-24 NOTE — Progress Notes (Deleted)
  Bethanie Dicker, NP-C Phone: [phone]  Veronica Norton is a 48 y.o. female who presents today for transfer of care.   ***  Social History   Tobacco Use  Smoking Status Never  Smokeless Tobacco Never    Current Outpatient Medications on File Prior to Visit  Medication Sig Dispense Refill   glipiZIDE (GLUCOTROL) 5 MG tablet Take 1 tablet (5 mg total) by mouth 2 (two) times daily before a meal. 180 tablet 3   hydrochlorothiazide (HYDRODIURIL) 25 MG tablet Take 1 tablet (25 mg total) by mouth daily. 90 tablet 1   metFORMIN (GLUCOPHAGE-XR) 500 MG 24 hr tablet Take 2 tablets (1,000 mg total) by mouth 2 (two) times daily. 360 tablet 3   rosuvastatin (CRESTOR) 20 MG tablet Take 1 tablet (20 mg total) by mouth daily. 90 tablet 0   tirzepatide (MOUNJARO) 5 MG/0.5ML Pen Inject 5 mg into the skin once a week. 6 mL 1   No current facility-administered medications on file prior to visit.     ROS see history of present illness  Objective  Physical Exam There were no vitals filed for this visit.  BP Readings from Last 3 Encounters:  04/23/23 (!) 130/90  03/28/23 (!) 130/92  08/24/22 126/80   Wt Readings from Last 3 Encounters:  04/23/23 277 lb (125.6 kg)  03/28/23 271 lb 6 oz (123.1 kg)  08/24/22 276 lb (125.2 kg)    Physical Exam   Assessment/Plan: Please see individual problem list.  There are no diagnoses linked to this encounter.   Health Maintenance: ***  No follow-ups on file.   Bethanie Dicker, NP-C [facility] - [facility]

## 2023-05-28 ENCOUNTER — Encounter: Payer: Self-pay | Admitting: Family Medicine

## 2023-06-18 ENCOUNTER — Other Ambulatory Visit: Payer: Self-pay | Admitting: Nurse Practitioner

## 2023-06-18 MED ORDER — GLIPIZIDE 5 MG PO TABS: 5.0000 mg | ORAL_TABLET | Freq: Two times a day (BID) | ORAL | 3 refills | Status: DC

## 2023-06-18 NOTE — Telephone Encounter (Signed)
 Copied from CRM [id number]. Topic: Clinical - Medication Refill >> Jun 18, 2023  9:24 AM Pascal Lux wrote: Most Recent Primary Care Visit:  Provider: Birdie Sons ERIC G  Department: [facility]  Visit Type: OFFICE VISIT  Date: 04/23/2023  Medication: glipiZIDE (GLUCOTROL) 5 MG tablet [[id number]]  Has the patient contacted their pharmacy? Yes (Agent: If no, request that the patient contact the pharmacy for the refill. If patient does not wish to contact the pharmacy document the reason why and proceed with request.) (Agent: If yes, when and what did the pharmacy advise?) told to call provider; no more refills  Is this the correct pharmacy for this prescription? Yes If no, delete pharmacy and type the correct one.  This is the patient's preferred pharmacy:  [facility] [address], [place] - [address] [place] [address] Phone: [phone] Fax: [phone]   Has the prescription been filled recently? No  Is the patient out of the medication? Yes  Has the patient been seen for an appointment in the last year OR does the patient have an upcoming appointment? Yes  Can we respond through MyChart? Yes  Agent: Please be advised that Rx refills may take up to 3 business days. We ask that you follow-up with your pharmacy.

## 2023-06-27 ENCOUNTER — Ambulatory Visit: Payer: BC Managed Care – PPO | Admitting: Nurse Practitioner

## 2023-06-27 ENCOUNTER — Encounter: Payer: Self-pay | Admitting: Nurse Practitioner

## 2023-06-27 VITALS — BP 124/86 | HR 82 | Temp 98.4°F | Ht 65.0 in | Wt 273.2 lb

## 2023-06-27 DIAGNOSIS — E1159 Type 2 diabetes mellitus with other circulatory complications: Secondary | ICD-10-CM | POA: Diagnosis not present

## 2023-06-27 DIAGNOSIS — I152 Hypertension secondary to endocrine disorders: Secondary | ICD-10-CM | POA: Diagnosis not present

## 2023-06-27 DIAGNOSIS — E1169 Type 2 diabetes mellitus with other specified complication: Secondary | ICD-10-CM

## 2023-06-27 DIAGNOSIS — E785 Hyperlipidemia, unspecified: Secondary | ICD-10-CM

## 2023-06-27 DIAGNOSIS — Z7984 Long term (current) use of oral hypoglycemic drugs: Secondary | ICD-10-CM

## 2023-06-27 DIAGNOSIS — F439 Reaction to severe stress, unspecified: Secondary | ICD-10-CM

## 2023-06-27 DIAGNOSIS — N951 Menopausal and female climacteric states: Secondary | ICD-10-CM | POA: Insufficient documentation

## 2023-06-27 DIAGNOSIS — Z7985 Long-term (current) use of injectable non-insulin antidiabetic drugs: Secondary | ICD-10-CM

## 2023-06-27 DIAGNOSIS — E119 Type 2 diabetes mellitus without complications: Secondary | ICD-10-CM

## 2023-06-27 MED ORDER — TIRZEPATIDE 7.5 MG/0.5ML ~~LOC~~ SOAJ: 7.5000 mg | SUBCUTANEOUS | 0 refills | Status: DC

## 2023-06-27 NOTE — Assessment & Plan Note (Addendum)
 Cholesterol is managed with Crestor 20 mg daily. No adverse effects reported. Continue Crestor daily. LDL in January- 74.

## 2023-06-27 NOTE — Progress Notes (Signed)
 Bethanie Dicker, NP-C Phone: [phone]  Veronica Norton is a 48 y.o. female who presents today for transfer of care.   Discussed the use of AI scribe software for clinical note transcription with the patient, who gave verbal consent to proceed.  History of Present Illness   Veronica Norton is a 48 year old female who presents for transfer of care with symptoms of perimenopause.  She experiences symptoms associated with perimenopause, including night sweats described as 'a little dry and like a little moist' at the back of her neck, but not severe enough to require changing clothes or sheets. She also notes mood changes and irritability. Her menstrual cycles remain regular, occurring monthly. She has been on birth control until recently, having stopped after being on it for many years. She mentions concerns about weight changes potentially related to long-term birth control use.  She has a history of diabetes and is currently on metformin 1000 mg twice daily, glipizide 5 mg twice daily, and Mounjaro 5 mg once weekly. Her last A1c was 6.9. She does not check her blood sugar at home. No excessive thirst or urination. She works part-time in Engineering ge[employer], which impacts her ability to stay hydrated.  She is managing hypertension with hydrochlorothiazide 25 mg daily. She does not monitor her blood pressure at home. No chest pain, shortness of breath, dizziness, or swelling.  Her social history includes significant stressors, such as her mother's diagnosis of dementia and her fianc being out of work. She feels overwhelmed, stating 'I'm just surviving.' She has not been on any medication for mood disorders previously and is considering seeing a therapist.      Social History   Tobacco Use  Smoking Status Never  Smokeless Tobacco Never    Current Outpatient Medications on File Prior to Visit  Medication Sig Dispense Refill   hydrochlorothiazide (HYDRODIURIL) 25 MG tablet Take 1 tablet (25 mg  total) by mouth daily. 90 tablet 1   metFORMIN (GLUCOPHAGE-XR) 500 MG 24 hr tablet Take 2 tablets (1,000 mg total) by mouth 2 (two) times daily. 360 tablet 3   rosuvastatin (CRESTOR) 20 MG tablet Take 1 tablet (20 mg total) by mouth daily. 90 tablet 0   No current facility-administered medications on file prior to visit.     ROS see history of present illness  Objective  Physical Exam Vitals:   06/27/23 1539  BP: 124/86  Pulse: 82  Temp: 98.4 F (36.9 C)  SpO2: 98%    BP Readings from Last 3 Encounters:  06/27/23 124/86  04/23/23 (!) 130/90  03/28/23 (!) 130/92   Wt Readings from Last 3 Encounters:  06/27/23 273 lb 3.2 oz (123.9 kg)  04/23/23 277 lb (125.6 kg)  03/28/23 271 lb 6 oz (123.1 kg)    Physical Exam Constitutional:      General: She is not in acute distress.    Appearance: Normal appearance.  HENT:     Head: Normocephalic.  Cardiovascular:     Rate and Rhythm: Normal rate and regular rhythm.     Heart sounds: Normal heart sounds.  Pulmonary:     Effort: Pulmonary effort is normal.     Breath sounds: Normal breath sounds.  Skin:    General: Skin is warm and dry.  Neurological:     General: No focal deficit present.     Mental Status: She is alert.  Psychiatric:        Mood and Affect: Mood normal.  Behavior: Behavior normal.     Assessment/Plan: Please see individual problem list.  Type 2 diabetes mellitus without complication, without long-term current use of insulin (HCC) Assessment & Plan: Diabetes is managed with metformin, glipizide, and Mounjaro. Her last A1c was 6.9%. To optimize glycemic control, recheck A1c today and increase Mounjaro to 7.5 mg weekly. Discontinue glipizide if A1c is well-controlled. Encourage healthy diet and regular exercise.   Orders: -     Hemoglobin A1c -     Tirzepatide; Inject 7.5 mg into the skin once a week.  Dispense: 6 mL; Refill: 0  Perimenopausal symptoms Assessment & Plan: She experiences mild  night sweats and mood changes and prefers non-pharmacological management. Discussed future options including hormone replacement therapy and SSRIs. Consider referral to OB GYN for further management post-menopause if interested in hormone replacement therapy. She will continue to monitor her symptoms and contact if interested in starting a SSRI.    Stress Assessment & Plan: She is interested in therapy for stress and mood changes. Provide referral to a female therapist for counseling.   Orders: -     Ambulatory referral to Psychology  Hypertension associated with diabetes [facility]) Assessment & Plan: Hypertension is well-controlled with hydrochlorothiazide. Continue hydrochlorothiazide 25 mg daily and monitor blood pressure regularly. Check CMP today.   Orders: -     Comprehensive metabolic panel with GFR  Hyperlipidemia associated with type 2 diabetes mellitus (HCC) Assessment & Plan: Cholesterol is managed with Crestor 20 mg daily. No adverse effects reported. Continue Crestor daily. LDL in January- 74.      Return in about 3 months (around 09/26/2023) for Follow up.   Bluford Burkitt, NP-C [facility] - [facility]

## 2023-06-27 NOTE — Assessment & Plan Note (Signed)
 She experiences mild night sweats and mood changes and prefers non-pharmacological management. Discussed future options including hormone replacement therapy and SSRIs. Consider referral to OB GYN for further management post-menopause if interested in hormone replacement therapy. She will continue to monitor her symptoms and contact if interested in starting a SSRI.

## 2023-06-27 NOTE — Assessment & Plan Note (Signed)
 She is interested in therapy for stress and mood changes. Provide referral to a female therapist for counseling.

## 2023-06-27 NOTE — Assessment & Plan Note (Signed)
 Hypertension is well-controlled with hydrochlorothiazide. Continue hydrochlorothiazide 25 mg daily and monitor blood pressure regularly. Check CMP today.

## 2023-06-27 NOTE — Assessment & Plan Note (Signed)
 Diabetes is managed with metformin, glipizide, and Mounjaro. Her last A1c was 6.9%. To optimize glycemic control, recheck A1c today and increase Mounjaro to 7.5 mg weekly. Discontinue glipizide if A1c is well-controlled. Encourage healthy diet and regular exercise.

## 2023-06-28 ENCOUNTER — Encounter: Payer: Self-pay | Admitting: Nurse Practitioner

## 2023-06-28 LAB — COMPREHENSIVE METABOLIC PANEL WITH GFR
ALT: 19 U/L (ref 0–35)
AST: 16 U/L (ref 0–37)
Albumin: 4 g/dL (ref 3.5–5.2)
Alkaline Phosphatase: 60 U/L (ref 39–117)
BUN: 13 mg/dL (ref 6–23)
CO2: 29 meq/L (ref 19–32)
Calcium: 9 mg/dL (ref 8.4–10.5)
Chloride: 99 meq/L (ref 96–112)
Creatinine, Ser: 0.74 mg/dL (ref 0.40–1.20)
GFR: 96.56 mL/min (ref >60.00)
Glucose, Bld: 107 mg/dL — ABNORMAL HIGH (ref 70–99)
Potassium: 4 meq/L (ref 3.5–5.1)
Sodium: 136 meq/L (ref 135–145)
Total Bilirubin: 0.4 mg/dL (ref 0.2–1.2)
Total Protein: 7.2 g/dL (ref 6.0–8.3)

## 2023-06-28 LAB — HEMOGLOBIN A1C: Hgb A1c MFr Bld: 7.4 % — ABNORMAL HIGH (ref 4.6–6.5)

## 2023-09-28 ENCOUNTER — Ambulatory Visit: Admitting: Nurse Practitioner

## 2023-09-28 VITALS — BP 130/80 | HR 80 | Temp 98.2°F | Ht 65.0 in | Wt 272.2 lb

## 2023-09-28 DIAGNOSIS — I152 Hypertension secondary to endocrine disorders: Secondary | ICD-10-CM

## 2023-09-28 DIAGNOSIS — F439 Reaction to severe stress, unspecified: Secondary | ICD-10-CM | POA: Diagnosis not present

## 2023-09-28 DIAGNOSIS — Z7985 Long-term (current) use of injectable non-insulin antidiabetic drugs: Secondary | ICD-10-CM | POA: Diagnosis not present

## 2023-09-28 DIAGNOSIS — E119 Type 2 diabetes mellitus without complications: Secondary | ICD-10-CM | POA: Diagnosis not present

## 2023-09-28 DIAGNOSIS — B351 Tinea unguium: Secondary | ICD-10-CM

## 2023-09-28 DIAGNOSIS — E1159 Type 2 diabetes mellitus with other circulatory complications: Secondary | ICD-10-CM | POA: Diagnosis not present

## 2023-09-28 LAB — MICROALBUMIN / CREATININE URINE RATIO
Creatinine,U: 110.5 mg/dL
Microalb Creat Ratio: UNDETERMINED mg/g (ref 0.0–30.0)
Microalb, Ur: <0.7 mg/dL

## 2023-09-28 LAB — POCT GLYCOSYLATED HEMOGLOBIN (HGB A1C)
HbA1c POC (<> result, manual entry): 6.9 % (ref 4.0–5.6)
HbA1c, POC (controlled diabetic range): 6.9 % (ref 0.0–7.0)
HbA1c, POC (prediabetic range): 6.9 % — AB (ref 5.7–6.4)
Hemoglobin A1C: 6.9 % — AB (ref 4.0–5.6)

## 2023-09-28 MED ORDER — SERTRALINE HCL 50 MG PO TABS: 50.0000 mg | ORAL_TABLET | Freq: Every day | ORAL | 0 refills | Status: DC

## 2023-09-28 NOTE — Progress Notes (Signed)
 Leron Glance, NP-C Phone: [phone]  Veronica Norton is a 48 y.o. female who presents today for follow up.   Discussed the use of AI scribe software for clinical note transcription with the patient, who gave verbal consent to proceed.  History of Present Illness   Veronica Norton is a 48 year old female with diabetes who presents for a three-month follow-up on diabetes management.  She is currently on Mounjaro , metformin , and glipizide  for diabetes management. Her Mounjaro  dose was increased to 7.5 mg at the last visit. She reports no excessive thirst, urination, or symptoms of hypoglycemia such as shakiness, clamminess, or lightheadedness. She does not regularly check her blood glucose levels at home.  She experienced recent gastrointestinal upset, which she attributes to consuming greasy food. She notes a decreased appetite, particularly in the evenings, and sometimes realizes she is not hungry even late at night.  She is also on hydrochlorothiazide  for hypertension and Crestor  for hyperlipidemia. She does not monitor her blood pressure at home and reports no chest pain, shortness of breath, dizziness, or edema.  She mentions a concern about her left big toe, which she describes as having a fungal infection, possibly due to frequent pedicures.  She discusses stress and mood concerns, noting financial stress and the emotional impact of her mother's declining health. She describes feeling overwhelmed as the primary caregiver and the emotional toll of her mother's condition.      Social History   Tobacco Use  Smoking Status Never  Smokeless Tobacco Never    Current Outpatient Medications on File Prior to Visit  Medication Sig Dispense Refill   hydrochlorothiazide  (HYDRODIURIL ) 25 MG tablet Take 1 tablet (25 mg total) by mouth daily. 90 tablet 1   metFORMIN  (GLUCOPHAGE -XR) 500 MG 24 hr tablet Take 2 tablets (1,000 mg total) by mouth 2 (two) times daily. 360 tablet 3    rosuvastatin  (CRESTOR ) 20 MG tablet Take 1 tablet (20 mg total) by mouth daily. 90 tablet 0   tirzepatide  (MOUNJARO ) 7.5 MG/0.5ML Pen Inject 7.5 mg into the skin once a week. 6 mL 0   No current facility-administered medications on file prior to visit.     ROS see history of present illness  Objective  Physical Exam Vitals:   09/28/23 1004  BP: 130/80  Pulse: 80  Temp: 98.2 F (36.8 C)  SpO2: 99%    BP Readings from Last 3 Encounters:  09/28/23 130/80  06/27/23 124/86  04/23/23 (!) 130/90   Wt Readings from Last 3 Encounters:  09/28/23 272 lb 3.2 oz (123.5 kg)  06/27/23 273 lb 3.2 oz (123.9 kg)  04/23/23 277 lb (125.6 kg)    Physical Exam Constitutional:      General: She is not in acute distress.    Appearance: Normal appearance.  HENT:     Head: Normocephalic.  Cardiovascular:     Rate and Rhythm: Normal rate and regular rhythm.     Heart sounds: Normal heart sounds.  Pulmonary:     Effort: Pulmonary effort is normal.     Breath sounds: Normal breath sounds.  Feet:     Left foot:     Toenail Condition: Fungal disease present. Skin:    General: Skin is warm and dry.  Neurological:     General: No focal deficit present.     Mental Status: She is alert.  Psychiatric:        Mood and Affect: Mood normal. Affect is tearful.  Behavior: Behavior normal.      Assessment/Plan: Please see individual problem list.  Type 2 diabetes mellitus without complication, without long-term current use of insulin  (HCC) Assessment & Plan: Mounjaro  dose was previously increased to 7.5 mg with no hypoglycemic symptoms reported. Appetite has decreased, and gastrointestinal upset is likely due to greasy food intake. Emphasize hydration and protein intake, recommending low-carb, high-protein meals and avoiding greasy foods. Check hemoglobin A1c today. Continue Mounjaro , metformin , and glipizide . Encourage regular physical activity.  Orders: -     Microalbumin / creatinine  urine ratio -     POCT glycosylated hemoglobin (Hb A1C)  Stress Assessment & Plan: Stress and mood issues are related to financial strain and family responsibilities. PHQ- 10 and GAD- 7 today. Zoloft  is discussed as a treatment option, effective for both anxiety and depression, with full therapeutic effect expected in 4-6 weeks. Start Zoloft  50 mg daily at bedtime. Counseled patient on common side effects. Encouraged to contact if worsening symptoms, unusual behavior changes or suicidal thoughts occur. Follow up in 6 weeks to assess medication efficacy and encourage finding a therapist.  Orders: -     Sertraline  HCl; Take 1 tablet (50 mg total) by mouth at bedtime.  Dispense: 90 tablet; Refill: 0  Onychomycosis Assessment & Plan: A fungal infection on the left great toenail is likely from frequent pedicures. Trial use of over-the-counter antifungal treatment such as Kerasal. Advise removing nail polish in the fall for treatment application. We will continue to monitor. Consider Lamisil if persisting.    Hypertension associated with diabetes [facility]) Assessment & Plan: Hypertension is well-controlled with hydrochlorothiazide . Continue hydrochlorothiazide  25 mg daily and monitor blood pressure regularly.   Orders: -     Microalbumin / creatinine urine ratio     Return in about 6 weeks (around 11/09/2023) for Anxiety/Depression, then 3 months for follow up on Diabetes.   Leron Glance, NP-C [place] [facility] - [facility]

## 2023-10-10 ENCOUNTER — Encounter: Payer: Self-pay | Admitting: Nurse Practitioner

## 2023-10-10 DIAGNOSIS — B351 Tinea unguium: Secondary | ICD-10-CM | POA: Insufficient documentation

## 2023-10-10 NOTE — Assessment & Plan Note (Signed)
 Hypertension is well-controlled with hydrochlorothiazide . Continue hydrochlorothiazide  25 mg daily and monitor blood pressure regularly.

## 2023-10-10 NOTE — Assessment & Plan Note (Signed)
 Mounjaro  dose was previously increased to 7.5 mg with no hypoglycemic symptoms reported. Appetite has decreased, and gastrointestinal upset is likely due to greasy food intake. Emphasize hydration and protein intake, recommending low-carb, high-protein meals and avoiding greasy foods. Check hemoglobin A1c today. Continue Mounjaro , metformin , and glipizide . Encourage regular physical activity.

## 2023-10-10 NOTE — Assessment & Plan Note (Addendum)
 Stress and mood issues are related to financial strain and family responsibilities. PHQ- 10 and GAD- 7 today. Zoloft  is discussed as a treatment option, effective for both anxiety and depression, with full therapeutic effect expected in 4-6 weeks. Start Zoloft  50 mg daily at bedtime. Counseled patient on common side effects. Encouraged to contact if worsening symptoms, unusual behavior changes or suicidal thoughts occur. Follow up in 6 weeks to assess medication efficacy and encourage finding a therapist.

## 2023-10-10 NOTE — Assessment & Plan Note (Signed)
 A fungal infection on the left great toenail is likely from frequent pedicures. Trial use of over-the-counter antifungal treatment such as Kerasal. Advise removing nail polish in the fall for treatment application. We will continue to monitor. Consider Lamisil if persisting.

## 2023-10-29 ENCOUNTER — Other Ambulatory Visit: Payer: Self-pay

## 2023-10-29 DIAGNOSIS — E1169 Type 2 diabetes mellitus with other specified complication: Secondary | ICD-10-CM

## 2023-10-29 DIAGNOSIS — I152 Hypertension secondary to endocrine disorders: Secondary | ICD-10-CM

## 2023-10-29 DIAGNOSIS — F439 Reaction to severe stress, unspecified: Secondary | ICD-10-CM

## 2023-10-29 MED ORDER — SERTRALINE HCL 50 MG PO TABS: 50.0000 mg | ORAL_TABLET | Freq: Every day | ORAL | 3 refills | Status: DC

## 2023-10-29 MED ORDER — ROSUVASTATIN CALCIUM 20 MG PO TABS: 20.0000 mg | ORAL_TABLET | Freq: Every day | ORAL | 3 refills | Status: AC

## 2023-10-29 MED ORDER — HYDROCHLOROTHIAZIDE 25 MG PO TABS: 25.0000 mg | ORAL_TABLET | Freq: Every day | ORAL | 3 refills | Status: AC

## 2023-11-14 ENCOUNTER — Ambulatory Visit: Admitting: Nurse Practitioner

## 2023-11-18 ENCOUNTER — Other Ambulatory Visit: Payer: Self-pay | Admitting: Nurse Practitioner

## 2023-11-18 DIAGNOSIS — E119 Type 2 diabetes mellitus without complications: Secondary | ICD-10-CM

## 2024-01-01 ENCOUNTER — Ambulatory Visit: Admitting: Nurse Practitioner

## 2024-01-14 ENCOUNTER — Other Ambulatory Visit: Payer: Self-pay | Admitting: Nurse Practitioner

## 2024-01-14 DIAGNOSIS — E119 Type 2 diabetes mellitus without complications: Secondary | ICD-10-CM

## 2024-01-16 ENCOUNTER — Ambulatory Visit: Admitting: Nurse Practitioner

## 2024-02-06 ENCOUNTER — Ambulatory Visit: Admitting: Nurse Practitioner

## 2024-02-06 ENCOUNTER — Encounter: Payer: Self-pay | Admitting: Nurse Practitioner

## 2024-02-06 VITALS — BP 138/78 | HR 86 | Temp 97.6°F | Ht 65.0 in | Wt 266.0 lb

## 2024-02-06 DIAGNOSIS — I152 Hypertension secondary to endocrine disorders: Secondary | ICD-10-CM

## 2024-02-06 DIAGNOSIS — E119 Type 2 diabetes mellitus without complications: Secondary | ICD-10-CM

## 2024-02-06 DIAGNOSIS — E1169 Type 2 diabetes mellitus with other specified complication: Secondary | ICD-10-CM

## 2024-02-06 DIAGNOSIS — Z23 Encounter for immunization: Secondary | ICD-10-CM

## 2024-02-06 DIAGNOSIS — E559 Vitamin D deficiency, unspecified: Secondary | ICD-10-CM

## 2024-02-06 DIAGNOSIS — F439 Reaction to severe stress, unspecified: Secondary | ICD-10-CM

## 2024-02-06 NOTE — Progress Notes (Signed)
 Leron Glance, NP-C Phone: [phone]  Veronica Norton is a 48 y.o. female who presents today for follow up.   Discussed the use of AI scribe software for clinical note transcription with the patient, who gave verbal consent to proceed.  History of Present Illness   Veronica Norton is a 48 year old female with diabetes and hypertension who presents for follow-up on blood sugar and mood.  She has not started her prescribed medication for mood due to concerns about side effects, specifically feeling like a 'zombie'. Significant stress related to financial responsibilities and family dynamics has impacted her decision to delay starting the medication.  She is experiencing significant stress due to financial burdens, including being the primary income earner while her partner is between jobs. She manages household bills and recently caught up on [employer] industries, although the mortgage is in her deceased aunt's name, complicating communication with the mortgage company. She is also dealing with family legal matters, including seeking guardianship for her mother, who is not coherent enough to manage her affairs.  She has diabetes and is currently on Mounjaro  7.5 mg, metformin , and glipizide . She missed two weeks of Mounjaro  but has resumed it this week. She feels slightly unwell after taking it but did not vomit. Her last recorded A1c was 6.9%, and she has lost six pounds since her last visit. She is also on Crestor  for cholesterol and hydrochlorothiazide  for blood pressure, which she takes consistently.  She currently has two dogs, after two recently passed away. Her partner is more of a dog lover, and she prefers to have just one dog. Her social history includes significant stress from managing household responsibilities and financial pressures, impacting her health and well-being.  No chest pain or shortness of breath. She feels overwhelmed and stressed, attributing it to her current life  circumstances.      Social History   Tobacco Use  Smoking Status Never  Smokeless Tobacco Never    Current Outpatient Medications on File Prior to Visit  Medication Sig Dispense Refill   glipiZIDE  (GLUCOTROL ) 5 MG tablet Take 5 mg by mouth 2 (two) times daily.     hydrochlorothiazide  (HYDRODIURIL ) 25 MG tablet Take 1 tablet (25 mg total) by mouth daily. 90 tablet 3   metFORMIN  (GLUCOPHAGE -XR) 500 MG 24 hr tablet TAKE 2 TABLETS BY MOUTH TWICE A DAY 360 tablet 3   rosuvastatin  (CRESTOR ) 20 MG tablet Take 1 tablet (20 mg total) by mouth daily. 90 tablet 3   sertraline  (ZOLOFT ) 50 MG tablet Take 1 tablet (50 mg total) by mouth at bedtime. (Patient not taking: Reported on 02/06/2024) 90 tablet 3   No current facility-administered medications on file prior to visit.     ROS see history of present illness  Objective  Physical Exam Vitals:   02/06/24 1455  BP: 138/78  Pulse: 86  Temp: 97.6 F (36.4 C)  SpO2: 98%    BP Readings from Last 3 Encounters:  02/06/24 138/78  09/28/23 130/80  06/27/23 124/86   Wt Readings from Last 3 Encounters:  02/06/24 266 lb (120.7 kg)  09/28/23 272 lb 3.2 oz (123.5 kg)  06/27/23 273 lb 3.2 oz (123.9 kg)    Physical Exam Constitutional:      General: She is not in acute distress.    Appearance: Normal appearance.  HENT:     Head: Normocephalic.  Cardiovascular:     Rate and Rhythm: Normal rate and regular rhythm.     Heart sounds: Normal  heart sounds.  Pulmonary:     Effort: Pulmonary effort is normal.     Breath sounds: Normal breath sounds.  Skin:    General: Skin is warm and dry.  Neurological:     General: No focal deficit present.     Mental Status: She is alert.  Psychiatric:        Mood and Affect: Mood normal.        Behavior: Behavior normal.      Assessment/Plan: Please see individual problem list.  Type 2 diabetes mellitus without complication, without long-term current use of insulin  (HCC) Assessment &  Plan: There was a recent lapse in Mounjaro  administration, but her A1c is at 6.9%, indicating controlled diabetes. The goal is to maintain HbA1c below 7%. Continue Mounjaro  7.5 mg weekly, metformin , and glipizide . Titrate Mounjaro  to 10 mg after consistent use for at least a month. Consider discontinuing glipizide  as Mounjaro  is increased. Encourage healthy diet and regular exercise.    Stress Assessment & Plan: She is experiencing increased stress and life challenges. Zoloft  was discussed and prescribed previously but she did not start the medication. Start Zoloft  25 mg daily, she will take half of the 50 mg tablet that she already has. Counseled patient on common side effects. Encouraged to contact if worsening symptoms, unusual behavior changes or suicidal thoughts occur. Follow up in 6 weeks to assess response to medication.   Hypertension associated with diabetes (HCC) Assessment & Plan: Her blood pressure is above goal today, previously adequately controlled and is managed with hydrochlorothiazide . Continue hydrochlorothiazide  daily. Encourage home monitoring.    Hyperlipidemia associated with type 2 diabetes mellitus (HCC) Assessment & Plan: Cholesterol is managed with Crestor  20 mg daily. No adverse effects reported. Continue Crestor  daily.    Need for influenza vaccination -     Flu vaccine trivalent PF, 6mos and older(Flulaval,Afluria,Fluarix,Fluzone)      Return in about 6 weeks (around 03/19/2024) for Follow up.   Leron Glance, NP-C [facility]

## 2024-02-20 ENCOUNTER — Other Ambulatory Visit: Payer: Self-pay | Admitting: Nurse Practitioner

## 2024-02-20 DIAGNOSIS — E119 Type 2 diabetes mellitus without complications: Secondary | ICD-10-CM

## 2024-02-25 ENCOUNTER — Encounter: Payer: Self-pay | Admitting: Nurse Practitioner

## 2024-02-25 NOTE — Assessment & Plan Note (Signed)
 There was a recent lapse in Mounjaro  administration, but her A1c is at 6.9%, indicating controlled diabetes. The goal is to maintain HbA1c below 7%. Continue Mounjaro  7.5 mg weekly, metformin , and glipizide . Titrate Mounjaro  to 10 mg after consistent use for at least a month. Consider discontinuing glipizide  as Mounjaro  is increased. Encourage healthy diet and regular exercise.

## 2024-02-25 NOTE — Assessment & Plan Note (Signed)
 Cholesterol is managed with Crestor  20 mg daily. No adverse effects reported. Continue Crestor  daily.

## 2024-02-25 NOTE — Assessment & Plan Note (Signed)
 Her blood pressure is above goal today, previously adequately controlled and is managed with hydrochlorothiazide . Continue hydrochlorothiazide  daily. Encourage home monitoring.

## 2024-02-25 NOTE — Assessment & Plan Note (Signed)
 She is experiencing increased stress and life challenges. Zoloft  was discussed and prescribed previously but she did not start the medication. Start Zoloft  25 mg daily, she will take half of the 50 mg tablet that she already has. Counseled patient on common side effects. Encouraged to contact if worsening symptoms, unusual behavior changes or suicidal thoughts occur. Follow up in 6 weeks to assess response to medication.

## 2024-03-27 ENCOUNTER — Other Ambulatory Visit: Payer: Self-pay | Admitting: Medical Genetics

## 2024-03-27 ENCOUNTER — Ambulatory Visit: Admitting: Nurse Practitioner

## 2024-03-27 VITALS — BP 130/86 | HR 82 | Temp 98.3°F | Ht 65.0 in | Wt 262.6 lb

## 2024-03-27 DIAGNOSIS — Z7984 Long term (current) use of oral hypoglycemic drugs: Secondary | ICD-10-CM

## 2024-03-27 DIAGNOSIS — F439 Reaction to severe stress, unspecified: Secondary | ICD-10-CM

## 2024-03-27 DIAGNOSIS — I152 Hypertension secondary to endocrine disorders: Secondary | ICD-10-CM

## 2024-03-27 DIAGNOSIS — Z7985 Long-term (current) use of injectable non-insulin antidiabetic drugs: Secondary | ICD-10-CM | POA: Diagnosis not present

## 2024-03-27 DIAGNOSIS — E1159 Type 2 diabetes mellitus with other circulatory complications: Secondary | ICD-10-CM

## 2024-03-27 DIAGNOSIS — E785 Hyperlipidemia, unspecified: Secondary | ICD-10-CM | POA: Diagnosis not present

## 2024-03-27 DIAGNOSIS — E559 Vitamin D deficiency, unspecified: Secondary | ICD-10-CM | POA: Diagnosis not present

## 2024-03-27 DIAGNOSIS — E1169 Type 2 diabetes mellitus with other specified complication: Secondary | ICD-10-CM

## 2024-03-27 DIAGNOSIS — E119 Type 2 diabetes mellitus without complications: Secondary | ICD-10-CM

## 2024-03-27 MED ORDER — TIRZEPATIDE 10 MG/0.5ML ~~LOC~~ SOAJ: 10.0000 mg | SUBCUTANEOUS | 2 refills | Status: AC

## 2024-03-27 MED ORDER — SERTRALINE HCL 50 MG PO TABS: 50.0000 mg | ORAL_TABLET | Freq: Every day | ORAL | Status: AC

## 2024-03-27 NOTE — Assessment & Plan Note (Signed)
 Managed with Zoloft  50 mg daily, though adherence has been inconsistent. Discussed therapeutic timeline and therapy options, including psychiatrists, therapists and online services. Continue Zoloft  50 mg daily and encouraged consistent medication adherence. Discussed therapy options, including seeing a therapist or psychiatrist, and provided list of local therapists. Encouraged to contact if worsening symptoms, unusual behavior changes or suicidal thoughts occur. We will continue to monitor.

## 2024-03-27 NOTE — Assessment & Plan Note (Signed)
 Her blood pressure is adequately controlled and is managed with hydrochlorothiazide . Continue hydrochlorothiazide  daily. Encourage home monitoring. Check CMP.

## 2024-03-27 NOTE — Assessment & Plan Note (Signed)
 Cholesterol is managed with Crestor  20 mg daily. No adverse effects reported. Continue Crestor . Check fasting lipid panel.

## 2024-03-27 NOTE — Assessment & Plan Note (Signed)
 A1c is at 6.9%, indicating controlled diabetes. The goal is to maintain HbA1c below 7%. Managed with Mounjaro  7.5 mg weekly, metformin , and glipizide . Discussed increasing Mounjaro  to improve A1c and potentially discontinuing glipizide . She agreed to the dose increase despite concerns about side effects. Increased Mounjaro  to 10 mg weekly. Check A1c. Plan to discontinue glipizide  if A1c improves with increased Mounjaro . Encourage healthy diet and regular exercise.

## 2024-03-27 NOTE — Progress Notes (Signed)
 " Leron Glance, NP-C Phone: [phone]  Veronica Norton is a 49 y.o. female who presents today for follow up.   Discussed the use of AI scribe software for clinical note transcription with the patient, who gave verbal consent to proceed.  History of Present Illness   Veronica Norton is a 49 year old female who presents with concerns about ADHD and ongoing stress management.  She experiences significant stress and difficulty focusing, which she attributes to potential ADHD. Her mind feels scattered, and she struggles with concentration, particularly at work, leading to distraction and decreased productivity. 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  She has been taking Zoloft , starting at 50 mg daily, but her use is inconsistent, sometimes forgetting doses. Initially, she experienced nausea, but this side effect has subsided. She is unsure if the medication is helping with her anxiety and stress. She is uncertain if her symptoms are due to ADHD or stress.  She discusses her family responsibilities, including caring for her mother, which adds to her stress. A recent bereavement, having buried her aunt, has contributed to her emotional burden.  She is currently on Mounjaro  7.5 mg for diabetes management, along with metformin  and glipizide . She expresses concern about her A1c levels. She also takes Crestor  for cholesterol and hydrochlorothiazide  for blood pressure.  She mentions a history of low vitamin D  levels, particularly in the winter months, but does not currently take vitamin D  supplements.      Tobacco Use History[1]  Medications Ordered Prior to Encounter[2]   ROS see history of present illness  Objective  Physical Exam Vitals:   03/27/24 1607  BP: 130/86  Pulse: 82  Temp: 98.3 F (36.8 C)  SpO2: 98%    BP Readings from Last 3 Encounters:  03/27/24 130/86  02/06/24 138/78  09/28/23 130/80   Wt Readings from Last 3  Encounters:  03/27/24 262 lb 9.6 oz (119.1 kg)  02/06/24 266 lb (120.7 kg)  09/28/23 272 lb 3.2 oz (123.5 kg)    Physical Exam Constitutional:      General: She is not in acute distress.    Appearance: Normal appearance.  HENT:     Head: Normocephalic.  Cardiovascular:     Rate and Rhythm: Normal rate and regular rhythm.     Heart sounds: Normal heart sounds.  Pulmonary:     Effort: Pulmonary effort is normal.     Breath sounds: Normal breath sounds.  Skin:    General: Skin is warm and dry.  Neurological:     General: No focal deficit present.     Mental Status: She is alert.  Psychiatric:        Mood and Affect: Mood normal.        Behavior: Behavior normal.      Assessment/Plan: Please see individual problem list.  Type 2 diabetes mellitus without complication, without long-term current use of insulin  (HCC) Assessment & Plan: A1c is at 6.9%, indicating controlled diabetes. The goal is to maintain HbA1c below 7%. Managed with Mounjaro  7.5 mg weekly, metformin , and glipizide . Discussed increasing Mounjaro  to improve A1c and potentially discontinuing glipizide . She agreed to the dose increase despite concerns about side effects. Increased Mounjaro  to 10 mg weekly. Check A1c. Plan to discontinue glipizide  if A1c improves with increased Mounjaro . Encourage healthy diet and regular exercise.   Orders: -     Hemoglobin A1c; Future -     Tirzepatide ; Inject 10 mg into the skin  once a week.  Dispense: 2 mL; Refill: 2  Stress Assessment & Plan: Managed with Zoloft  50 mg daily, though adherence has been inconsistent. Discussed therapeutic timeline and therapy options, including psychiatrists, therapists and online services. Continue Zoloft  50 mg daily and encouraged consistent medication adherence. Discussed therapy options, including seeing a therapist or psychiatrist, and provided list of local therapists. Encouraged to contact if worsening symptoms, unusual behavior changes or  suicidal thoughts occur. We will continue to monitor.   Orders: -     TSH; Future -     Sertraline  HCl; Take 1 tablet (50 mg total) by mouth at bedtime.  Hypertension associated with diabetes (HCC) Assessment & Plan: Her blood pressure is adequately controlled and is managed with hydrochlorothiazide . Continue hydrochlorothiazide  daily. Encourage home monitoring. Check CMP.   Orders: -     Comprehensive metabolic panel with GFR; Future  Hyperlipidemia associated with type 2 diabetes mellitus (HCC) Assessment & Plan: Cholesterol is managed with Crestor  20 mg daily. No adverse effects reported. Continue Crestor . Check fasting lipid panel.   Orders: -     Comprehensive metabolic panel with GFR; Future -     Lipid panel; Future  Vitamin D  deficiency -     VITAMIN D  25 Hydroxy (Vit-D Deficiency, Fractures); Future     Return for fasting labs then in 6 weeks for follow up.   Leron Glance, NP-C [facility] [place]     [1]  Social History Tobacco Use  Smoking Status Never  Smokeless Tobacco Never  [2]  Current Outpatient Medications on File Prior to Visit  Medication Sig Dispense Refill   glipiZIDE  (GLUCOTROL ) 5 MG tablet Take 5 mg by mouth 2 (two) times daily.     hydrochlorothiazide  (HYDRODIURIL ) 25 MG tablet Take 1 tablet (25 mg total) by mouth daily. 90 tablet 3   metFORMIN  (GLUCOPHAGE -XR) 500 MG 24 hr tablet TAKE 2 TABLETS BY MOUTH TWICE A DAY 360 tablet 3   rosuvastatin  (CRESTOR ) 20 MG tablet Take 1 tablet (20 mg total) by mouth daily. 90 tablet 3   No current facility-administered medications on file prior to visit.   "

## 2024-03-28 ENCOUNTER — Other Ambulatory Visit

## 2024-03-28 ENCOUNTER — Ambulatory Visit: Payer: Self-pay | Admitting: Nurse Practitioner

## 2024-03-28 DIAGNOSIS — E1169 Type 2 diabetes mellitus with other specified complication: Secondary | ICD-10-CM | POA: Diagnosis not present

## 2024-03-28 DIAGNOSIS — E785 Hyperlipidemia, unspecified: Secondary | ICD-10-CM

## 2024-03-28 DIAGNOSIS — E559 Vitamin D deficiency, unspecified: Secondary | ICD-10-CM

## 2024-03-28 DIAGNOSIS — E119 Type 2 diabetes mellitus without complications: Secondary | ICD-10-CM

## 2024-03-28 DIAGNOSIS — F439 Reaction to severe stress, unspecified: Secondary | ICD-10-CM | POA: Diagnosis not present

## 2024-03-28 DIAGNOSIS — E1159 Type 2 diabetes mellitus with other circulatory complications: Secondary | ICD-10-CM

## 2024-03-28 DIAGNOSIS — I152 Hypertension secondary to endocrine disorders: Secondary | ICD-10-CM

## 2024-03-28 LAB — COMPREHENSIVE METABOLIC PANEL WITH GFR
ALT: 19 U/L (ref 3–35)
AST: 13 U/L (ref 5–37)
Albumin: 4 g/dL (ref 3.5–5.2)
Alkaline Phosphatase: 64 U/L (ref 39–117)
BUN: 15 mg/dL (ref 6–23)
CO2: 31 meq/L (ref 19–32)
Calcium: 9.2 mg/dL (ref 8.4–10.5)
Chloride: 100 meq/L (ref 96–112)
Creatinine, Ser: 0.73 mg/dL (ref 0.40–1.20)
GFR: 97.63 mL/min
Glucose, Bld: 117 mg/dL — ABNORMAL HIGH (ref 70–99)
Potassium: 4.4 meq/L (ref 3.5–5.1)
Sodium: 136 meq/L (ref 135–145)
Total Bilirubin: 0.6 mg/dL (ref 0.2–1.2)
Total Protein: 7.3 g/dL (ref 6.0–8.3)

## 2024-03-28 LAB — LIPID PANEL
Cholesterol: 138 mg/dL (ref 28–200)
HDL: 47 mg/dL
LDL Cholesterol: 82 mg/dL (ref 10–99)
NonHDL: 91.34
Total CHOL/HDL Ratio: 3
Triglycerides: 45 mg/dL (ref 10.0–149.0)
VLDL: 9 mg/dL (ref 0.0–40.0)

## 2024-03-28 LAB — TSH: TSH: 1.42 u[IU]/mL (ref 0.35–5.50)

## 2024-03-28 LAB — HEMOGLOBIN A1C: Hgb A1c MFr Bld: 7.5 % — ABNORMAL HIGH (ref 4.6–6.5)

## 2024-03-28 LAB — VITAMIN D 25 HYDROXY (VIT D DEFICIENCY, FRACTURES): VITD: 32.36 ng/mL (ref 30.00–100.00)

## 2024-05-22 ENCOUNTER — Ambulatory Visit: Admitting: Nurse Practitioner
